# Patient Record
Sex: Female | Born: 1985 | Race: White | Hispanic: No | Marital: Married | Smoking: Former smoker
Health system: Southern US, Community
[De-identification: ages and names within clinical notes are randomized; demographics above are authoritative.]

## PROBLEM LIST (undated history)

## (undated) DIAGNOSIS — Z720 Tobacco use: Secondary | ICD-10-CM

## (undated) DIAGNOSIS — I10 Essential (primary) hypertension: Secondary | ICD-10-CM

## (undated) DIAGNOSIS — K219 Gastro-esophageal reflux disease without esophagitis: Secondary | ICD-10-CM

## (undated) DIAGNOSIS — E039 Hypothyroidism, unspecified: Secondary | ICD-10-CM

## (undated) DIAGNOSIS — K449 Diaphragmatic hernia without obstruction or gangrene: Secondary | ICD-10-CM

## (undated) DIAGNOSIS — I493 Ventricular premature depolarization: Secondary | ICD-10-CM

## (undated) HISTORY — DX: Gastro-esophageal reflux disease without esophagitis: K21.9

## (undated) HISTORY — DX: Diaphragmatic hernia without obstruction or gangrene: K44.9

## (undated) HISTORY — DX: Tobacco use: Z72.0

## (undated) HISTORY — DX: Ventricular premature depolarization: I49.3

---
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
## 2006-01-05 ENCOUNTER — Inpatient Hospital Stay (HOSPITAL_COMMUNITY): Admission: AD | Admit: 2006-01-05 | Discharge: 2006-01-08 | Payer: Self-pay | Admitting: Obstetrics and Gynecology

## 2006-01-10 ENCOUNTER — Inpatient Hospital Stay (HOSPITAL_COMMUNITY): Admission: AD | Admit: 2006-01-10 | Discharge: 2006-01-12 | Payer: Self-pay | Admitting: Obstetrics and Gynecology

## 2006-01-12 ENCOUNTER — Inpatient Hospital Stay (HOSPITAL_COMMUNITY): Admission: AD | Admit: 2006-01-12 | Discharge: 2006-01-14 | Payer: Self-pay | Admitting: Obstetrics & Gynecology

## 2006-01-17 ENCOUNTER — Inpatient Hospital Stay (HOSPITAL_COMMUNITY): Admission: AD | Admit: 2006-01-17 | Discharge: 2006-01-21 | Payer: Self-pay | Admitting: Obstetrics and Gynecology

## 2006-01-17 ENCOUNTER — Ambulatory Visit (HOSPITAL_COMMUNITY): Admission: RE | Admit: 2006-01-17 | Discharge: 2006-01-17 | Payer: Self-pay | Admitting: Obstetrics & Gynecology

## 2006-01-23 ENCOUNTER — Observation Stay (HOSPITAL_COMMUNITY): Admission: AD | Admit: 2006-01-23 | Discharge: 2006-01-23 | Payer: Self-pay | Admitting: Obstetrics and Gynecology

## 2006-01-27 ENCOUNTER — Observation Stay (HOSPITAL_COMMUNITY): Admission: AD | Admit: 2006-01-27 | Discharge: 2006-01-27 | Payer: Self-pay | Admitting: Obstetrics and Gynecology

## 2006-02-01 ENCOUNTER — Ambulatory Visit (HOSPITAL_COMMUNITY): Admission: EM | Admit: 2006-02-01 | Discharge: 2006-02-01 | Payer: Self-pay | Admitting: Obstetrics and Gynecology

## 2006-02-12 ENCOUNTER — Ambulatory Visit (HOSPITAL_COMMUNITY): Admission: AD | Admit: 2006-02-12 | Discharge: 2006-02-12 | Payer: Self-pay | Admitting: Obstetrics and Gynecology

## 2006-02-14 ENCOUNTER — Inpatient Hospital Stay (HOSPITAL_COMMUNITY): Admission: AD | Admit: 2006-02-14 | Discharge: 2006-02-18 | Payer: Self-pay | Admitting: Obstetrics & Gynecology

## 2006-02-15 ENCOUNTER — Encounter (INDEPENDENT_AMBULATORY_CARE_PROVIDER_SITE_OTHER): Payer: Self-pay | Admitting: *Deleted

## 2006-02-28 ENCOUNTER — Emergency Department (HOSPITAL_COMMUNITY): Admission: EM | Admit: 2006-02-28 | Discharge: 2006-02-28 | Payer: Self-pay | Admitting: Emergency Medicine

## 2006-03-04 ENCOUNTER — Ambulatory Visit (HOSPITAL_COMMUNITY): Admission: RE | Admit: 2006-03-04 | Discharge: 2006-03-04 | Payer: Self-pay | Admitting: General Surgery

## 2006-03-04 ENCOUNTER — Encounter (INDEPENDENT_AMBULATORY_CARE_PROVIDER_SITE_OTHER): Payer: Self-pay | Admitting: Specialist

## 2006-04-06 ENCOUNTER — Emergency Department (HOSPITAL_COMMUNITY): Admission: EM | Admit: 2006-04-06 | Discharge: 2006-04-06 | Payer: Self-pay | Admitting: Emergency Medicine

## 2006-04-21 ENCOUNTER — Emergency Department (HOSPITAL_COMMUNITY): Admission: EM | Admit: 2006-04-21 | Discharge: 2006-04-21 | Payer: Self-pay | Admitting: Emergency Medicine

## 2006-05-28 HISTORY — PX: ESOPHAGOGASTRODUODENOSCOPY: SHX1529

## 2006-07-16 ENCOUNTER — Emergency Department (HOSPITAL_COMMUNITY): Admission: EM | Admit: 2006-07-16 | Discharge: 2006-07-16 | Payer: Self-pay | Admitting: Emergency Medicine

## 2006-09-12 ENCOUNTER — Emergency Department (HOSPITAL_COMMUNITY): Admission: EM | Admit: 2006-09-12 | Discharge: 2006-09-13 | Payer: Self-pay | Admitting: Emergency Medicine

## 2007-02-10 ENCOUNTER — Emergency Department (HOSPITAL_COMMUNITY): Admission: EM | Admit: 2007-02-10 | Discharge: 2007-02-11 | Payer: Self-pay | Admitting: Emergency Medicine

## 2007-03-02 ENCOUNTER — Emergency Department (HOSPITAL_COMMUNITY): Admission: EM | Admit: 2007-03-02 | Discharge: 2007-03-02 | Payer: Self-pay | Admitting: Emergency Medicine

## 2007-03-28 ENCOUNTER — Ambulatory Visit: Payer: Self-pay | Admitting: Internal Medicine

## 2007-04-03 ENCOUNTER — Ambulatory Visit (HOSPITAL_COMMUNITY): Admission: RE | Admit: 2007-04-03 | Discharge: 2007-04-03 | Payer: Self-pay | Admitting: Internal Medicine

## 2007-04-03 ENCOUNTER — Ambulatory Visit: Payer: Self-pay | Admitting: Internal Medicine

## 2007-04-23 ENCOUNTER — Ambulatory Visit: Payer: Self-pay | Admitting: Gastroenterology

## 2007-05-07 ENCOUNTER — Ambulatory Visit (HOSPITAL_COMMUNITY): Admission: RE | Admit: 2007-05-07 | Discharge: 2007-05-07 | Payer: Self-pay | Admitting: Gastroenterology

## 2007-10-23 ENCOUNTER — Emergency Department (HOSPITAL_COMMUNITY): Admission: EM | Admit: 2007-10-23 | Discharge: 2007-10-24 | Payer: Self-pay | Admitting: Emergency Medicine

## 2008-01-16 ENCOUNTER — Emergency Department (HOSPITAL_COMMUNITY): Admission: EM | Admit: 2008-01-16 | Discharge: 2008-01-16 | Payer: Self-pay | Admitting: Emergency Medicine

## 2008-02-22 ENCOUNTER — Emergency Department (HOSPITAL_COMMUNITY): Admission: EM | Admit: 2008-02-22 | Discharge: 2008-02-22 | Payer: Self-pay | Admitting: Emergency Medicine

## 2009-02-08 ENCOUNTER — Ambulatory Visit: Payer: Self-pay | Admitting: Internal Medicine

## 2009-02-08 DIAGNOSIS — K219 Gastro-esophageal reflux disease without esophagitis: Secondary | ICD-10-CM | POA: Insufficient documentation

## 2009-02-08 DIAGNOSIS — R1013 Epigastric pain: Secondary | ICD-10-CM

## 2009-02-09 ENCOUNTER — Ambulatory Visit (HOSPITAL_COMMUNITY): Admission: RE | Admit: 2009-02-09 | Discharge: 2009-02-09 | Payer: Self-pay | Admitting: Internal Medicine

## 2009-02-09 ENCOUNTER — Encounter (INDEPENDENT_AMBULATORY_CARE_PROVIDER_SITE_OTHER): Payer: Self-pay

## 2009-02-10 ENCOUNTER — Encounter: Payer: Self-pay | Admitting: Gastroenterology

## 2009-02-10 ENCOUNTER — Telehealth (INDEPENDENT_AMBULATORY_CARE_PROVIDER_SITE_OTHER): Payer: Self-pay | Admitting: *Deleted

## 2009-02-21 ENCOUNTER — Other Ambulatory Visit: Admission: RE | Admit: 2009-02-21 | Discharge: 2009-02-21 | Payer: Self-pay | Admitting: Obstetrics and Gynecology

## 2009-02-24 ENCOUNTER — Emergency Department (HOSPITAL_COMMUNITY): Admission: EM | Admit: 2009-02-24 | Discharge: 2009-02-24 | Payer: Self-pay | Admitting: Emergency Medicine

## 2009-03-05 ENCOUNTER — Ambulatory Visit: Payer: Self-pay | Admitting: Physician Assistant

## 2009-03-05 ENCOUNTER — Inpatient Hospital Stay (HOSPITAL_COMMUNITY): Admission: AD | Admit: 2009-03-05 | Discharge: 2009-03-05 | Payer: Self-pay | Admitting: Obstetrics and Gynecology

## 2009-03-23 ENCOUNTER — Inpatient Hospital Stay (HOSPITAL_COMMUNITY): Admission: AD | Admit: 2009-03-23 | Discharge: 2009-03-24 | Payer: Self-pay | Admitting: Obstetrics & Gynecology

## 2009-04-18 ENCOUNTER — Inpatient Hospital Stay (HOSPITAL_COMMUNITY): Admission: AD | Admit: 2009-04-18 | Discharge: 2009-04-18 | Payer: Self-pay | Admitting: Family Medicine

## 2009-05-23 ENCOUNTER — Inpatient Hospital Stay (HOSPITAL_COMMUNITY): Admission: AD | Admit: 2009-05-23 | Discharge: 2009-05-24 | Payer: Self-pay | Admitting: Obstetrics & Gynecology

## 2009-05-28 HISTORY — PX: TUBAL LIGATION: SHX77

## 2009-06-13 ENCOUNTER — Inpatient Hospital Stay (HOSPITAL_COMMUNITY): Admission: AD | Admit: 2009-06-13 | Discharge: 2009-06-13 | Payer: Self-pay | Admitting: Family Medicine

## 2009-08-16 ENCOUNTER — Ambulatory Visit: Payer: Self-pay | Admitting: Advanced Practice Midwife

## 2009-08-16 ENCOUNTER — Inpatient Hospital Stay (HOSPITAL_COMMUNITY): Admission: AD | Admit: 2009-08-16 | Discharge: 2009-08-16 | Payer: Self-pay | Admitting: Obstetrics & Gynecology

## 2009-10-20 ENCOUNTER — Inpatient Hospital Stay (HOSPITAL_COMMUNITY): Admission: RE | Admit: 2009-10-20 | Discharge: 2009-10-22 | Payer: Self-pay | Admitting: Obstetrics and Gynecology

## 2009-10-20 ENCOUNTER — Ambulatory Visit: Payer: Self-pay | Admitting: Family Medicine

## 2009-10-23 ENCOUNTER — Emergency Department (HOSPITAL_COMMUNITY): Admission: EM | Admit: 2009-10-23 | Discharge: 2009-10-23 | Payer: Self-pay | Admitting: Emergency Medicine

## 2010-06-18 ENCOUNTER — Encounter: Payer: Self-pay | Admitting: Internal Medicine

## 2010-08-01 ENCOUNTER — Encounter: Payer: Self-pay | Admitting: Gastroenterology

## 2010-08-09 ENCOUNTER — Ambulatory Visit: Payer: Self-pay | Admitting: Gastroenterology

## 2010-08-13 LAB — WET PREP, GENITAL
Clue Cells Wet Prep HPF POC: NONE SEEN
Trich, Wet Prep: NONE SEEN
Yeast Wet Prep HPF POC: NONE SEEN

## 2010-08-14 LAB — POCT CARDIAC MARKERS
CKMB, poc: 1 ng/mL (ref 1.0–8.0)
Myoglobin, poc: 53 ng/mL (ref 12–200)
Troponin i, poc: <0.05 ng/mL (ref 0.00–0.09)

## 2010-08-14 LAB — CBC
HCT: 27.7 % — ABNORMAL LOW (ref 36.0–46.0)
HCT: 32.2 % — ABNORMAL LOW (ref 36.0–46.0)
Hemoglobin: 11.3 g/dL — ABNORMAL LOW (ref 12.0–15.0)
MCHC: 35.2 g/dL (ref 30.0–36.0)
MCV: 92.4 fL (ref 78.0–100.0)
MCV: 92.6 fL (ref 78.0–100.0)
Platelets: 153 10*3/uL (ref 150–400)
Platelets: 167 10*3/uL (ref 150–400)
RBC: 3.48 MIL/uL — ABNORMAL LOW (ref 3.87–5.11)
RDW: 13.3 % (ref 11.5–15.5)
RDW: 13.5 % (ref 11.5–15.5)
WBC: 8.6 10*3/uL (ref 4.0–10.5)

## 2010-08-14 LAB — RPR: RPR Ser Ql: NONREACTIVE

## 2010-08-21 LAB — URINALYSIS, ROUTINE W REFLEX MICROSCOPIC
Bilirubin Urine: NEGATIVE
Glucose, UA: NEGATIVE mg/dL
Hgb urine dipstick: NEGATIVE
Ketones, ur: NEGATIVE mg/dL
Leukocytes, UA: NEGATIVE
Nitrite: NEGATIVE
Protein, ur: 30 mg/dL — AB
Specific Gravity, Urine: 1.015 (ref 1.005–1.030)
Urobilinogen, UA: 0.2 mg/dL (ref 0.0–1.0)
pH: 7 (ref 5.0–8.0)

## 2010-08-21 LAB — URINE MICROSCOPIC-ADD ON

## 2010-08-21 LAB — FETAL FIBRONECTIN: Fetal Fibronectin: NEGATIVE

## 2010-08-28 LAB — URINALYSIS, ROUTINE W REFLEX MICROSCOPIC
Ketones, ur: NEGATIVE mg/dL
Nitrite: NEGATIVE
Specific Gravity, Urine: 1.02 (ref 1.005–1.030)
Urobilinogen, UA: 0.2 mg/dL (ref 0.0–1.0)
pH: 7 (ref 5.0–8.0)

## 2010-08-28 LAB — WET PREP, GENITAL: Clue Cells Wet Prep HPF POC: NONE SEEN

## 2010-08-30 LAB — WET PREP, GENITAL
Clue Cells Wet Prep HPF POC: NONE SEEN
Trich, Wet Prep: NONE SEEN
Yeast Wet Prep HPF POC: NONE SEEN

## 2010-08-30 LAB — CBC
HCT: 36.6 % (ref 36.0–46.0)
Hemoglobin: 12.4 g/dL (ref 12.0–15.0)
MCHC: 34 g/dL (ref 30.0–36.0)
MCV: 89.2 fL (ref 78.0–100.0)
RDW: 13.9 % (ref 11.5–15.5)

## 2010-08-30 LAB — URINALYSIS, ROUTINE W REFLEX MICROSCOPIC
Bilirubin Urine: NEGATIVE
Glucose, UA: NEGATIVE mg/dL
Hgb urine dipstick: NEGATIVE
Ketones, ur: NEGATIVE mg/dL
Protein, ur: NEGATIVE mg/dL

## 2010-08-31 LAB — URINALYSIS, ROUTINE W REFLEX MICROSCOPIC
Bilirubin Urine: NEGATIVE
Glucose, UA: NEGATIVE mg/dL
Glucose, UA: NEGATIVE mg/dL
Hgb urine dipstick: NEGATIVE
Ketones, ur: NEGATIVE mg/dL
Ketones, ur: 15 mg/dL — AB
Nitrite: NEGATIVE
Protein, ur: NEGATIVE mg/dL
Specific Gravity, Urine: 1.02 (ref 1.005–1.030)
pH: 6.5 (ref 5.0–8.0)
pH: 6 (ref 5.0–8.0)

## 2010-08-31 LAB — WET PREP, GENITAL
Trich, Wet Prep: NONE SEEN
Yeast Wet Prep HPF POC: NONE SEEN

## 2010-09-01 LAB — CBC
HCT: 38.1 % (ref 36.0–46.0)
Hemoglobin: 13.3 g/dL (ref 12.0–15.0)
MCV: 87.2 fL (ref 78.0–100.0)
Platelets: 260 10*3/uL (ref 150–400)
WBC: 10 10*3/uL (ref 4.0–10.5)

## 2010-09-01 LAB — DIFFERENTIAL
Eosinophils Absolute: 0.2 10*3/uL (ref 0.0–0.7)
Eosinophils Relative: 2 % (ref 0–5)
Lymphocytes Relative: 30 % (ref 12–46)
Lymphs Abs: 3 10*3/uL (ref 0.7–4.0)
Monocytes Absolute: 0.6 10*3/uL (ref 0.1–1.0)
Monocytes Relative: 6 % (ref 3–12)

## 2010-09-01 LAB — URINALYSIS, ROUTINE W REFLEX MICROSCOPIC
Bilirubin Urine: NEGATIVE
Glucose, UA: NEGATIVE mg/dL
Hgb urine dipstick: NEGATIVE
Nitrite: NEGATIVE
Specific Gravity, Urine: 1.015 (ref 1.005–1.030)
pH: 6.5 (ref 5.0–8.0)

## 2010-09-01 LAB — URINE CULTURE: Colony Count: >=100000

## 2010-09-01 LAB — BASIC METABOLIC PANEL
BUN: 10 mg/dL (ref 6–23)
CO2: 25 mEq/L (ref 19–32)
Calcium: 9.2 mg/dL (ref 8.4–10.5)
Chloride: 106 mEq/L (ref 96–112)
Creatinine, Ser: 0.61 mg/dL (ref 0.4–1.2)
GFR calc Af Amer: >60 mL/min (ref >60)
GFR calc non Af Amer: >60 mL/min (ref >60)
GFR calc non Af Amer: >60 mL/min (ref >60)
Glucose, Bld: 94 mg/dL (ref 70–99)
Potassium: 3.8 mEq/L (ref 3.5–5.1)
Sodium: 137 mEq/L (ref 135–145)
Sodium: 135 mEq/L (ref 135–145)

## 2010-09-01 LAB — HEMOGLOBIN AND HEMATOCRIT, BLOOD: Hemoglobin: 13.6 g/dL (ref 12.0–15.0)

## 2010-09-27 ENCOUNTER — Emergency Department (HOSPITAL_COMMUNITY): Payer: Medicaid Other

## 2010-09-27 ENCOUNTER — Emergency Department (HOSPITAL_COMMUNITY)
Admission: EM | Admit: 2010-09-27 | Discharge: 2010-09-27 | Disposition: A | Discharge status: Home / Self Care | Payer: Medicaid Other | Attending: Emergency Medicine | Admitting: Emergency Medicine

## 2010-09-27 DIAGNOSIS — R569 Unspecified convulsions: Secondary | ICD-10-CM | POA: Insufficient documentation

## 2010-09-27 DIAGNOSIS — J45909 Unspecified asthma, uncomplicated: Secondary | ICD-10-CM | POA: Insufficient documentation

## 2010-09-27 DIAGNOSIS — S0003XA Contusion of scalp, initial encounter: Secondary | ICD-10-CM | POA: Insufficient documentation

## 2010-09-27 DIAGNOSIS — S1093XA Contusion of unspecified part of neck, initial encounter: Secondary | ICD-10-CM | POA: Insufficient documentation

## 2010-09-27 DIAGNOSIS — X58XXXA Exposure to other specified factors, initial encounter: Secondary | ICD-10-CM | POA: Insufficient documentation

## 2010-09-27 LAB — DIFFERENTIAL
Basophils Relative: 0 % (ref 0–1)
Eosinophils Absolute: 0.1 10*3/uL (ref 0.0–0.7)
Lymphs Abs: 2 10*3/uL (ref 0.7–4.0)
Monocytes Relative: 5 % (ref 3–12)
Neutro Abs: 7.2 10*3/uL (ref 1.7–7.7)
Neutrophils Relative %: 73 % (ref 43–77)

## 2010-09-27 LAB — URINALYSIS, ROUTINE W REFLEX MICROSCOPIC
Glucose, UA: NEGATIVE mg/dL
Hgb urine dipstick: NEGATIVE
Specific Gravity, Urine: >1.03 — ABNORMAL HIGH (ref 1.005–1.030)
Urobilinogen, UA: 0.2 mg/dL (ref 0.0–1.0)

## 2010-09-27 LAB — POCT PREGNANCY, URINE: Preg Test, Ur: NEGATIVE

## 2010-09-27 LAB — CBC
Hemoglobin: 13.5 g/dL (ref 12.0–15.0)
MCH: 28.1 pg (ref 26.0–34.0)
Platelets: 281 10*3/uL (ref 150–400)
RBC: 4.8 MIL/uL (ref 3.87–5.11)
WBC: 9.9 10*3/uL (ref 4.0–10.5)

## 2010-09-27 LAB — BASIC METABOLIC PANEL
CO2: 25 mEq/L (ref 19–32)
Chloride: 102 mEq/L (ref 96–112)
GFR calc Af Amer: >60 mL/min (ref >60)
Sodium: 138 mEq/L (ref 135–145)

## 2010-09-27 LAB — HEPATIC FUNCTION PANEL
Albumin: 4.4 g/dL (ref 3.5–5.2)
Total Protein: 8.1 g/dL (ref 6.0–8.3)

## 2010-10-03 ENCOUNTER — Other Ambulatory Visit: Payer: Self-pay | Admitting: Neurology

## 2010-10-03 DIAGNOSIS — R569 Unspecified convulsions: Secondary | ICD-10-CM

## 2010-10-10 ENCOUNTER — Ambulatory Visit (HOSPITAL_COMMUNITY)
Admission: RE | Admit: 2010-10-10 | Discharge: 2010-10-10 | Disposition: A | Discharge status: Home / Self Care | Payer: Medicaid Other | Source: Ambulatory Visit | Attending: Neurology | Admitting: Neurology

## 2010-10-10 DIAGNOSIS — G40802 Other epilepsy, not intractable, without status epilepticus: Secondary | ICD-10-CM | POA: Insufficient documentation

## 2010-10-10 DIAGNOSIS — R569 Unspecified convulsions: Secondary | ICD-10-CM

## 2010-10-10 NOTE — Op Note (Signed)
Teresa [name], Teresa [name]           ACCOUNT NO.:  [id number]   MEDICAL RECORD NO.:  [id number]          PATIENT TYPE:  AMB   LOCATION:  DAY                           FACILITY:  [facility]   PHYSICIAN:  R. Roetta Sessions, M.D. DATE OF BIRTH:  09/18/1985   DATE OF PROCEDURE:  04/03/2007  DATE OF DISCHARGE:                               OPERATIVE REPORT   PROCEDURE:  Diagnostic EGD, incomplete.   INDICATIONS FOR PROCEDURE:  The patient has longstanding esophageal  reflux disease, postprandial nausea and vomiting, occasional sensation  that food sticks behind her breast bone. She has had EGD previously and  underwent esophageal dilation in the past. She tells me she has  heartburn all the time and takes only Tums. EGD is now being done.  The potential for esophageal dilation reviewed with the patient  previously and again at bedside. All questions were answered.   MONITORING:  O2 saturation, blood pressure, pulse and respirations were  monitored throughout the entire procedure.   CONSCIOUS SEDATION:  Versed 7 mg IV, Demerol 150 mg IV, Phenergan 25 mg  diluted slow IV push IV to augment conscious sedation.  Cetacaine spray  for topical pharyngeal anesthesia.   INSTRUMENT:  Pentax video chip system.   FINDINGS:  Examination of the tubular esophagus revealed circumferential  distal esophageal erosions. There was no Barrett's esophagus and the  esophagus appeared to be widely patent.   STOMACH:  The gastric cavity insufflated well with air.  There were no  gross gastric mucosal abnormalities.  Retroflexion of the proximal  stomach and esophagogastric junction demonstrated a small hiatal hernia.  It is notable the patient became combative during the procedure in spite  of what I felt was adequate sedation and Phenergan augmentation.  She  was combative and uncooperative for the procedure.  The pylorus appeared  be patent but was not intubated, the duodenum was not seen.  The entire  gastric  mucosa was not well seen because the stomach could not be  insufflated well.  No attempt at dilation was made. The procedure was  terminated without being carried out to completion for the reasons  outlined above.   Incomplete exam. Circumferential distal esophageal erosions consistent  with moderately severe erosive reflux esophagitis. If anything patulous  esophagogastric junction, no significant ring or stricture seen.  The  patient has a hiatal hernia.  The stomach appeared grossly normal, the  pylorus was not intubated, duodenum was not seen. At this point, I went  we have enough information to treat her aggressively for  gastroesophageal reflux disease.  She tells me she had some bleeding and  left lower quadrant abdominal pain.  She has left lower quadrant  abdominal pain primarily when she has the epigastric of retrosternal  chest pain and when those symptoms settle down she does not have any  left lower quadrant pain. She has not had any change in her bowel  habits, there has been no melena or rectal bleeding and today her  abdominal exam reveals a flat abdomen, good bowel sounds and is entirely  soft and nontender. She is slated to have a  CT scan tomorrow. At this  point, I am going to cancel the CT for the time being. I started her on  Zegerid 40 mg orally twice daily for the next 2 weeks before breakfast  and supper and then back off to once daily before supper thereafter. For  the subsequent 2 weeks will have her come back to the office to see [place]  in 4 weeks for a recheck,  antireflux literature provided to Ms. Ford-  Ward. If this is not associated with marked dramatic improvement in her  symptoms would contemplate bringing her back for a complete evaluation  for her upper GI tract under propofol sedation with anesthesia help and  then would consider rescheduling abdominopelvic CT scan, but this lady  does have reflux and hopefully with aggressive acid suppression regimen   we will see dramatic improvement in the near future.      Jonathon Bellows, M.D.  Electronically Signed     RMR/MEDQ  D:  04/03/2007  T:  04/04/2007  Job:  [id number]   cc:   Mila Homer. Sudie Bailey, M.D.  Fax: [phone]

## 2010-10-10 NOTE — Consult Note (Signed)
NAMESHAVONTE, Teresa [name]           ACCOUNT NO.:  [id number]   MEDICAL RECORD NO.:  [id number]          PATIENT TYPE:  AMB   LOCATION:  DAY                           FACILITY:  [facility]   PHYSICIAN:  R. Roetta Sessions, M.D. DATE OF BIRTH:  19-Nov-1985   DATE OF CONSULTATION:  03/28/2007  DATE OF DISCHARGE:                                 CONSULTATION   REASON FOR CONSULTATION:  Nausea, vomiting, epigastric pain.   REQUESTING PHYSICIAN:  Mila Homer. Sudie Bailey, M.D.   HISTORY OF PRESENT ILLNESS:  Patient is a 25 year old Caucasian female  who presents today for further evaluation of post prandial nausea,  vomiting, epigastric pain.  She also has been having fainting spells  associated with vertigo and shortness of breath.  She states that she  has chronic asthma and uses albuterol a couple of times a week.  She has  seen Dr. Sudie Bailey for these symptoms.  She states that for the past 1-  1/2 months, she has been having post-prandial nausea and vomiting.  She  states that this feels like her food does not get all the way down.  She  has epigastric discomfort when this occurs.  She tries to wash the food  down with fluids, but it does not seem to help and eventually she  vomits.  She also has a left lower quadrant abdominal pain, which often  occurs at the same time.  Her bowel movements are unchanged and are very  regular.  No melena or rectal bleeding.  She has constant heartburn.  She states that her symptoms are worse when she lies down.  She denies  any NSAIDs or aspirin use.  She takes Tums p.r.n. without relief.  She  states that about six years ago, she was evaluated at [facility] and underwent  EGD and either a flexible sigmoidoscopy or a colonoscopy.  She states  that she had esophagitis and was told that she would need to come back  in about six years to have her esophagus stretched; however, she did not  have her esophagus stretched at that time.  She denies any unintentional  weight loss.   She states that she has actually gained 15 pounds in the  last two weeks and about 30 pounds over the last 1-2 months.  She states  that she is not eating enough to explain this.   She had a mildly elevated white count of 11,800, hemoglobin 12.9,  platelet count 379,000.  Sed rate was 13.  Her glucose was 100,  creatinine 0.65, total bilirubin 0.2, alkaline phosphatase 96, AST 11,  ALT 8, albumin 4.3.  Lipase 13, H. pylori 0.4.   HOME MEDICATIONS:  1. Albuterol inhaler usually 2 times a week.  2. Tums p.r.n.   ALLERGIES:  CECLOR.   PAST MEDICAL HISTORY:  1. Asthma.  2. GERD.  3. Borderline diabetes mellitus.  4. 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  5. Status post cesarean section in September, 2007.   FAMILY HISTORY:  She believes her mother has peptic ulcer disease.  No  family history  of diabetes, colorectal cancer, or liver disease.   SOCIAL HISTORY:  She is engaged.  She has a 90-year-old daughter and an  adopted 14-year-old son.  She is currently unemployed.  She does not  smoke and smoked for about six months as a teenager.  No alcohol use.   REVIEW OF SYSTEMS:  See HPI for GI.  CARDIOPULMONARY:  No chest pain,  shortness of breath.  GENITOURINARY:  No dysuria or hematuria.  Menses  are regular.  CONSTITUTIONAL:  See HPI for weight gain.   PHYSICAL EXAMINATION:  Weight 208.  Height 5 feet 5.  Temperature 98,  blood pressure 118/88, pulse 76.  GENERAL:  A pleasant, obese Caucasian female in no acute distress.  SKIN:  Warm and dry.  No jaundice.  HEENT:  Sclerae are anicteric.  Oropharyngeal mucosa moist and pink.  No  lesions, erythema, or exudate.  No lymphadenopathy or thyromegaly.  CHEST:  Lung sounds are clear to auscultation.  CARDIAC:  Regular rate and rhythm.  Normal S1 and S2.  No murmurs, rubs  or gallops.  ABDOMEN:  Positive bowel sounds.  Abdomen is obese but symmetrical and  soft.  She has moderate epigastric  tenderness to deep palpation as well  as in the left lower quadrant region.  No rebound tenderness or  guarding.  No organomegaly or masses.  No abdominal bruits or hernias.  EXTREMITIES:  No edema.   IMPRESSION:  Teresa [name] is a 25 year old lady with a 6-week history of post-  prandial nausea and vomiting, epigastric discomfort, and possibly  dysphagia.  She also has left lower quadrant abdominal pain which occurs  with all of these symptoms but without any change in her bowel  movements.  She may have developed an esophageal stricture or  complications from such as erosive, ulcerative reflux esophagitis.  Cannot exclude peptic ulcer disease.  With regards to her left lower  quadrant abdominal pain, would feel diverticulitis would be less likely.  It may be irritable bowel syndrome.  She complains of a 30 pound weight  gain in the last six weeks.  She is also having vertigo and some  fainting spells which she states lasts for seconds at a time.  She is  being followed by Dr. Sudie Bailey for this.   PLAN:  1. Will check a TSH.  2. EGD with esophageal dilatation.  3. CT of the abdomen and pelvis with IV and normal contrast to further      evaluate left lower quadrant abdominal pain.   I would like to thank Dr. John Giovanni for allowing [place] to take part  in the care of this patient.      Tana Coast, P.AJonathon Bellows, M.D.  Electronically Signed    LL/MEDQ  D:  03/28/2007  T:  03/29/2007  Job:  [id number]   cc:   Mila Homer. Sudie Bailey, M.D.  Fax: [phone]

## 2010-10-10 NOTE — Assessment & Plan Note (Signed)
Teresa [name], Teresa [name]            CHART#:  [id number]   DATE:  04/23/2007                       DOB:  Oct 03, 1985   CHIEF COMPLAINT:  Follow up of acid reflux and abdominal pain.   SUBJECTIVE:  Teresa [name] is here for a follow up.  She recently underwent  incomplete EGD by Dr. Jena Gauss on 04/03/07.  She was complaining of long  standing reflux which was uncontrolled, post-prandial nausea and  vomiting, dysphagia, left lower quadrant abdominal pain.  Her bowel  movements have always been regular and have not changed.  She notes that  her left lower quadrant abdominal pain would occur whenever her upper  abdominal discomfort or reflux occurred.  EGD was incomplete as the  patient had become combative during the procedure.  She states today  that she did not feel like she had adequate sedation and was in pain.  Her esophagus was noted to have circumferential distal esophageal  erosions.  She had a patulous EG junction.  She had a hiatal hernia.  The stomach appeared grossly normal.  Pylorus could not be intubated due  to the patient becoming combative.  She was started on Zegerid 40 mg  b.i.d.  She states that it seemed to help her reflux more than once day  dosing, however she continued to have symptoms.  She notes that her  abdominal pain has become progressively worse and is occurring now from  the left lower quadrant up the left mid abdomen.  She has had no change  in her bowel movements.  She did note about a week after her procedure  she passed black stools for about one week.  She denies any associated  lightheadedness or fatigue, shortness of breath or presyncope.  The  black stools have cleared.  When this occurred she had also seen a  little fresh blood on the toilet tissue.  She denies any further  vomiting.   CURRENT MEDICATIONS:  See detailed list.   ALLERGIES:  Ceclor.   PHYSICAL EXAMINATION:  WEIGHT:  206.5.  VITAL SIGNS:  Stable.  Temperature 97.6, blood pressure 118/80,  pulse  80.  GENERAL:  A pleasant, obese Caucasian female in no acute distress.  SKIN:  Warm and dry.  No jaundice.  HEENT:  Sclerae nonicteric.  Oral pharyngeal mucosa moist and pink.  CHEST:  Lungs clear to auscultation.  CARDIAC EXAM:  Reveals regular rate and rhythm.  ABDOMEN:  Positive bowel sounds.  Obese but symmetrical.  Soft.  She has  moderate tenderness from the left lower quadrant to the left mid abdomen  to deep palpation as well as some in the epigastrium.  No rebound  tenderness.  No guarding.  No organomegaly or masses, bruits or hernias.  EXTREMITIES:  No edema.   IMPRESSION:  Teresa [name] continues to have refractory GERD symptoms although  they did improve temporarily on Zegerid b.i.d. but worse now on once  a  day dosing.  She previously was on Prilosec in the past without adequate  control of her symptoms.  Her nausea and vomiting has subsided.  She has  progressive abdominal pain in the left lower quadrant to left mid  abdomen.  She reports a history of passing of black stools for about one  week which was a couple of weeks ago.  She also has occasionally seen  bright red  blood on the toilet tissue when this occurred.  Denies any  agents such as Pepto Bismol.  Really no orthostatic type symptoms  suggestive of a significant GI bleed.  On exam no clinical evidence of  significant anemia.   Because of persistent abdominal pain would like to go ahead and pursue  CT at this point in time.  If she sees anymore black stools she will  notify our office.  In the meantime, we will go ahead and check  hemoccult times three.  Would change her from Zegerid to Aciphex 20 mg  b.i.d. #30 samples given.  Will check a CBC, Met-7 as well.  Further  recommendations to follow.       Tana Coast, P.A.  Electronically Signed     Kassie Mends, M.D.  Electronically Signed    LL/MEDQ  D:  04/23/2007  T:  04/23/2007  Job:  [id number]   cc:   Mila Homer. Sudie Bailey, M.D.

## 2010-10-11 NOTE — Procedures (Signed)
NAMENICHOL, ATOR                ACCOUNT NO.:  [id number]  MEDICAL RECORD NO.:  [id number]           PATIENT TYPE:  O  LOCATION:  EE                           FACILITY:  [facility]  PHYSICIAN:  Lawerence Dery A. Gerilyn Pilgrim, M.D. DATE OF BIRTH:  11-22-1985  DATE OF PROCEDURE: DATE OF DISCHARGE:  10/10/2010                             EEG INTERPRETATION   INDICATIONS:  This is a 25 year old female who presents with new onset seizures.  MEDICATIONS:  Lorazepam.  ANALYSIS:  A 16-channel recording is conducted for 21 minutes using standard 10/20 measurements.  There is a posterior dominant rhythm of 10 Hz which attenuates with eye opening.  There is beta activity observed in the frontal areas.  Awake and sleep activities are recorded.  Vertex sharp waves along with K complexes and sleep spindles are observed. Photic stimulation and hyperventilation are carried out without abnormal changes in the background activity.  There is no focal or lateralized slowing.  There is no epileptiform activity observed.  IMPRESSION:  Normal recording of awake and sleep states.     Theodosia Bahena A. Gerilyn Pilgrim, M.D.     KAD/MEDQ  D:  10/11/2010  T:  10/11/2010  Job:  [id number]

## 2010-10-13 NOTE — Op Note (Signed)
Teresa [name], Teresa [name]           ACCOUNT NO.:  [id number]   MEDICAL RECORD NO.:  [id number]          PATIENT TYPE:  INP   LOCATION:  A402                          FACILITY:  [facility]   PHYSICIAN:  Tilda Burrow, M.D. DATE OF BIRTH:  03/14/1986   DATE OF PROCEDURE:  02/15/2006  DATE OF DISCHARGE:  02/18/2006                                 OPERATIVE REPORT   PREOPERATIVE DIAGNOSES:  Pregnancy, [redacted] weeks gestation, medical induction of  labor secondary to symptomatic gallstones, failed induction secondary to  breech presentation.   POSTOPERATIVE DIAGNOSES:  Pregnancy, [redacted] weeks gestation.  Medical induction  of labor secondary to symptomatic gallstones.  Failed induction secondary to  breech presentation.   PROCEDURE:  Primary low transverse cervical cesarean section.   SURGEON:  Tilda Burrow, MD.   ASSISTANT:  Canary Brim, RN.   ANESTHESIA:  Epidural converted to general due to hot spot in left lower  quadrant.   COMPLICATIONS:  None.   ESTIMATED BLOOD LOSS:  350 ml.   FLUIDS:  __________   FINDINGS:  Sacrum anterior female, Apgar's 9 and 9, weight  __________ .   INDICATIONS:  A 24 year old female induced at early term gestation due to  terribly symptomatic gallstones since 28 to [redacted] weeks gestation.  The patient  was admitted, a Foley bulb cervical ripening attempted, and when she would  reach 4 cm we were able to identify that there was a sacrum anterior breech  infant instead of vertex.   DETAILS OF THE PROCEDURE:  The patient was taken to the operating room after  her labor had been stopped.  Epidural was attempted and topped up but was  ineffective on the left side, and a hot spot prevented [place] doing a cesarean  delivery.  The patient's pain tolerance has always been terribly low.   A general anesthesia was introduced, endotracheal intubation performed, and  then the C-section performed in the standard fashion with a transverse 12 cm  skin incision, a  Pfannenstiel-type incision through the fascia, rectus  muscles, and peritoneal cavity.  A bladder flap was developed on the lower  uterine segment easily, a tranverse uterine incision was performed, and the  baby delivered from its breech presentation with the buttocks first.  The  legs were flexed once we reached the knees and delivered individually easily  in a controlled fashion, then the arms were swept in front of the baby in a  similar fashion and released.  The head was delivered while Marceau maneuver  was performed, maintaining the index finger in the mouth.  The baby cried  promptly.  Bulb suctioning was performed, and the infant passed to the  waiting Dr. Milford Cage for subsequent care.  See his notes for details.   Cord blood samples were obtained.  The placenta delivered intact in [place]  presentation.  The uterus was irrigated with antibiotic-containing solution.  The posterior lower uterine segment of the uterus had a fibrinous appearance  as if there had an been old subchorionic bleed.  All membrane remnants were  removed.  The uterus was closed with a single layer of running  locking  closure of #2-0 chromic, and then the bladder flap reapproximated with #2-0  chromic.   The abdomen was irrigated, the anterior peritoneum closed with #2-0 chromic,  the rectus muscles reapproximated with interrupted #2-0 chromic, the fascia  closed with #0 Vicryl in a continuous running fashion, and then subcutaneous  interrupted #2-0 plain sutures used to reapproximate the fatty tissues, and  staple closure of the skin completed the procedure.      Tilda Burrow, M.D.  Electronically Signed     JVF/MEDQ  D:  02/15/2006  T:  02/18/2006  Job:  [id number]   cc:   [facility]   Francoise Schaumann. Milford Cage DO, FAAP  Fax: [phone]

## 2010-10-13 NOTE — Discharge Summary (Signed)
[name], Teresa [name]           ACCOUNT NO.:  [id number]   MEDICAL RECORD NO.:  [id number]          PATIENT TYPE:  INP   LOCATION:  A419                          FACILITY:  [facility]   PHYSICIAN:  Lazaro Arms, M.D.   DATE OF BIRTH:  1986-05-28   DATE OF ADMISSION:  12/14/2005  DATE OF DISCHARGE:  07/24/2007LH                                 DISCHARGE SUMMARY   DISCHARGE DIAGNOSES:  1.  Intrauterine pregnancy at [redacted] weeks gestation.  2.  Right pyelonephritis.   PROCEDURE:  Admission to the hospital with daily management and discharge  care and interpretation of NST.   HISTORY OF PRESENT ILLNESS:  Please refer to the history and physical on the  antepartum chart for details of admission to the hospital.   HOSPITAL COURSE:  The patient was admitted with the presumptive diagnosis of  pyelonephritis with positive nitrites, lots of white blood cells, and right  flank pain.  She was placed on Unasyn IV antibiotics.  She continued to  spike fevers and was switched over to gentamycin, and she subsequently  defervesced.  She also had a significant improvement in her right flank  pain.  She also had an asthma episode while she was in the hospital and got  breathing treatments several times a day.  Her white count came down  significantly from admission of 12.3 down to 5.6 at the time of discharge.  She was discharged to home, having been afebrile for 48 hours on oral  Bactrim twice a day, and we will see her back in the office in one week.      Lazaro Arms, M.D.  Electronically Signed     LHE/MEDQ  D:  12/18/2005  T:  12/18/2005  Job:  [id number]

## 2010-10-13 NOTE — Discharge Summary (Signed)
NAMEDIERRA, RIESGO           ACCOUNT NO.:  [id number]   MEDICAL RECORD NO.:  [id number]          PATIENT TYPE:  INP   LOCATION:  A402                          FACILITY:  [facility]   PHYSICIAN:  Tilda Burrow, M.D. DATE OF BIRTH:  Jul 06, 1985   DATE OF ADMISSION:  02/14/2006  DATE OF DISCHARGE:  09/24/2007LH                                 DISCHARGE SUMMARY   ADMITTING DIAGNOSES:  1. Pregnancy 37+ weeks gestation.  2. Medically-indicated induction due to symptomatic cholelithiasis.  3. Right renal cyst vs mass   DISCHARGE DIAGNOSES:  1. Pregnancy 37+ weeks gestation, delivered.  2. Medically-indicated induction due to symptomatic cholelithiasis.  3. Right renal cyst vs mass  4. Frank breech presentation.   PROCEDURE:  1. Medical induction of labor, discontinued.  2. Primary low transverse cervical cesarean section,  Jannifer Franklin, M.D.   DISCHARGE MEDICATIONS:  1. Vicodin 5/500 20 tablets one to take q.4h. p.r.n. pain.  2. Motrin 800 mg q.8h. p.r.n. discomfort and soreness.   FOLLOW UP:  Four days staple removal.   HOSPITAL SUMMARY:  This 25 year old female gravida 1, para 0, at 37+ weeks  gestation has had a pregnancy which has been challenging due to  cholelithiasis with multiple gallbladder calculi.  At 28 weeks she was  evaluated and found to have gallstones.  She ended up being managed  throughout the remainder of the pregnancy with high pain med needs and  discomfort but did not have cholecystitis, just simply symptomatic  cholelithiasis.  Additionally, she had a right renal mass;  the ultrasound  that identified the gallstones also identified a 2.4 x 2.2 x 3.2 cm  hypoechoic focus in the right mid kidney that was felt to possibly represent  a small renal mass or a focal area of infection/pyelonephritis.  There was  no shadowing or renal cyst on further detail description.   Patient was admitted on the 20th for induction.  A Foley bulb was placed,  the next  morning amniotomy performed, scalp electrode applied over bony  prominence which, once she dilated to 4 cm, could be identified as sacrum.  Labor was discontinued and the patient taken promptly for a primary cesarean  section and delivering a healthy frank breech female infant, Apgars 9/9.  See detailed operative note for details.  EBL was minimal at 250 mL.   Postoperatively, the patient did well; hemoglobin was 10.8, hematocrit 31  preop.; _hgb_ 9.2, _hct_ 26.7 postop.  She tolerated postpartum care without  difficulty.  Maternal blood type O positive.   Discharged February 18, 2006, for follow up in 4 days and then 4 weeks.  She is scheduled to see Dr. Malen Gauze at [facility], [place],  [place], [place] for [place] and will also be referred for  removal of gallbladder once she has recovered adequately from the C-section.      Tilda Burrow, M.D.  Electronically Signed     JVF/MEDQ  D:  02/18/2006  T:  02/19/2006  Job:  [id number]

## 2010-10-13 NOTE — Op Note (Signed)
[name], [name]           ACCOUNT NO.:  [id number]   MEDICAL RECORD NO.:  [id number]          PATIENT TYPE:  INP   LOCATION:  A402                          FACILITY:  [facility]   PHYSICIAN:  Lazaro Arms, M.D.   DATE OF BIRTH:  1985/08/03   DATE OF PROCEDURE:  02/15/2006  DATE OF DISCHARGE:                                 OPERATIVE REPORT   PROCEDURE:  Epidural placement.   Teresa [name] is gravida 1 at 37 weeks being induced because of multiple  admissions to the hospital during this pregnancy for cholelithiasis.  She is  2 cm and 25% this morning and is already requesting an epidural.  She was  placed in a sitting position, Betadine prep is used, 1% lidocaine was  injected in the L3-L4 interspace.  A 17 gauge Tuohy needle was used, the  loss of resistance technique employed.  It took a couple of skin sticks  because of positioning and she also has scoliosis.  The epidural space was  found with one pass using loss of resistance technique.  10 mL of 0.125%  bupivacaine was given without ill effects.  The epidural catheter was then  fed and an additional 10 mL was given.  It was taped down 5 cm into the  epidural space.  Continuous infusion was begun at 12 mL an hour.  The  patient tolerated the procedure well, she is getting pain relief.  Blood  pressure stable, fetal heart rate tracing stable.      Lazaro Arms, M.D.  Electronically Signed     LHE/MEDQ  D:  02/15/2006  T:  02/17/2006  Job:  [id number]

## 2010-10-13 NOTE — Discharge Summary (Signed)
NAMEJAZZMON, Teresa [name]           ACCOUNT NO.:  [id number]   MEDICAL RECORD NO.:  [id number]          PATIENT TYPE:  INP   LOCATION:  A428                          FACILITY:  [facility]   PHYSICIAN:  Tilda Burrow, M.D. DATE OF BIRTH:  [birth year]   DATE OF ADMISSION:  01/05/2006  DATE OF DISCHARGE:  08/14/2007LH                                 DISCHARGE SUMMARY   ADMITTING DIAGNOSES:  1. Recurrent biliary colic.  2. Pregnancy, 31 weeks 3 days.   DISCHARGE DIAGNOSES:  1. Pregnancy 32 weeks' gestation.  2. Recurrent biliary colic, stable.   DISCHARGE MEDICATIONS:  Dilaudid 2 mg p.o. q.4-6 h. p.r.n. pain.   FOLLOWUP:  Follow up 2 weeks, [facility].   HISTORY OF PRESENT ILLNESS:  This 25 year old female, gravida 1, para 0, 31  weeks 3 days, with known cholelithiasis and gallbladder thickening was  readmitted January 05, 2006, for right upper quadrant pain extending into the  back and base of the neck.  She has been seen through of office for prenatal  care and she once again presents with amylase and lipase normal, liver  function tests normal, afebrile, with temperature 97.8, but symptomatic  gallbladder discomfort.   HOSPITAL COURSE:  The patient was admitted with hemoglobin 10, hematocrit  29, white count 9800; normal electrolytes except for potassium of 3.4, BUN  6, creatinine 0.6.  As I stated, lipase and amylase and liver function tests  are negative.  The patient then had hospitalization sips and chips and  gradually reintroduced to low-fat diet.  Surgical consultation with Dr.  Lovell Sheehan was performed.  Collaborative decision was made to try to wait out  the pregnancy and address cholecystectomy after the delivery.  Plans are for  delivery at 38 weeks and we will follow every 2 weeks until delivery.   DISCHARGE MEDICATIONS:  s 66 Dilaudid tablets at 2 mg each.      Tilda Burrow, M.D.  Electronically Signed     JVF/MEDQ  D:  01/08/2006  T:  01/08/2006  Job:   [id number]

## 2010-10-13 NOTE — Discharge Summary (Signed)
NAMETAKERRA, LUPINACCI           ACCOUNT NO.:  [id number]   MEDICAL RECORD NO.:  [id number]          PATIENT TYPE:  INP   LOCATION:  LDR3                          FACILITY:  [facility]   PHYSICIAN:  Lazaro Arms, M.D.   DATE OF BIRTH:  1985-09-08   DATE OF ADMISSION:  01/17/2006  DATE OF DISCHARGE:  08/27/2007LH                                 DISCHARGE SUMMARY   DISCHARGE DIAGNOSES:  1. Intrauterine pregnancy at 33-6/[redacted] weeks gestation.  2. Cholelithiasis with gallbladder colic.  3. Urinary tract infection.  4. Preterm labor.   PROCEDURES:  1. Admission to the hospital with daily care and discharge management.  2. Interpretation of NST's daily.   HISTORY AND PHYSICAL:  Please refer to the History and Physical and  antepartum chart for details on admission to the hospital.   HOSPITAL COURSE:  Teresa [name] was admitted again with nausea and vomiting,  abdominal pain.  She was found to have positive nitrites.  She had E. coli  in her culture.  She had begun on gentamycin and responded to that.  She had  a low-grade temperature of 99.  She was then begun on Bactrim which she was  sensitive to.  She has only thrown up twice in four days here in the  hospital.  She has been using an awful lot of IV narcotic, she states,  secondary to her gallbladder colic.  She seems to be less and less willing  to interact with the staff and concerned that there is some secondary gain  for her being here so often.  Her mother was present this morning when I  talked to her.  She nodded her head and never spoke at all.  Her mom said  she is, not a morning person.  In any event, she will be seen back in the  office on August 30 as scheduled.  She is discharge to home on Bactrim.  She  will continue her Terbutaline at home as well as her Dilaudid as needed and  Phenergan and Zofran for nausea and vomiting.  If she has any problems  between now and then, she will give [place] a call.      Lazaro Arms, M.D.  Electronically Signed     LHE/MEDQ  D:  01/21/2006  T:  01/21/2006  Job:  [id number]

## 2010-10-13 NOTE — H&P (Signed)
Teresa [name], Teresa [name]           ACCOUNT NO.:  [id number]   MEDICAL RECORD NO.:  [id number]          PATIENT TYPE:  AMB   LOCATION:  DAY                           FACILITY:  [facility]   PHYSICIAN:  Dalia Heading, M.D.  DATE OF BIRTH:  Nov 27, 1985   DATE OF ADMISSION:  DATE OF DISCHARGE:  LH                                HISTORY & PHYSICAL   CHIEF COMPLAINT:  Cholecystitis, cholelithiasis.   HISTORY OF PRESENT ILLNESS:  The patient is a 25 year old white female who  is referred for evaluation and treatment of biliary colic secondary to  cholelithiasis.  She was seen by me during her pregnancy, diagnosed with  cholelithiasis at that time.  It was elected to wait to proceed with a  cholecystectomy until after the patient had delivered.  She delivered her  baby 2 weeks ago.  She has been having right upper quadrant abdominal pain,  nausea, and bloating over the last few days.  She is not breast-feeding.  She does have fatty food intolerance.  No fever, chills, jaundice have been  noted.   PAST MEDICAL HISTORY:  Is as noted above.   PAST SURGICAL HISTORY:  C-section.   CURRENT MEDICATIONS:  Darvocet as needed for pain.   ALLERGIES:  No known drug allergies.   REVIEW OF SYSTEMS:  Noncontributory.   PHYSICAL EXAMINATION:  GENERAL:  The patient is a well-developed, well-  nourished white female in no acute distress.  HEENT: Examination reveals no scleral icterus.  LUNGS:  Clear to auscultation with equal breath sounds bilaterally.  HEART:  Heart examination reveals regular rate and rhythm without S3, S4, or  murmurs.  ABDOMEN:  The abdomen is soft, nondistended.  She is tender in the right  upper quadrant to palpation.  No hepatosplenomegaly, masses, hernias are  identified.   IMPRESSION:  Cholecystitis, cholelithiasis.   PLAN:  The patient is scheduled for laparoscopic cholecystectomy with  cholangiograms on 03/04/2006.  Risks and benefits of the procedure including  bleeding,  infection, hepatobiliary, and the possibly an open procedure were  fully explained to the patient, gave informed consent.  K-Dur and Darvocet  have been prescribed.      Dalia Heading, M.D.  Electronically Signed     MAJ/MEDQ  D:  02/28/2006  T:  03/01/2006  Job:  [id number]   cc:   Dalia Heading, M.D.  Fax: [phone]   Tilda Burrow, M.D.  Fax: [phone]   Littie Deeds, M.D.

## 2010-10-13 NOTE — Discharge Summary (Signed)
NAMETAJAH, NOGUCHI           ACCOUNT NO.:  [id number]   MEDICAL RECORD NO.:  [id number]          PATIENT TYPE:  INP   LOCATION:  A415                          FACILITY:  [facility]   PHYSICIAN:  Lazaro Arms, M.D.   DATE OF BIRTH:  Apr 29, 1986   DATE OF ADMISSION:  01/12/2006  DATE OF DISCHARGE:  08/20/2007LH                                 DISCHARGE SUMMARY   DISCHARGE DIAGNOSES:  1. Pregnancy at 32+ weeks gestation.  2. Abdominal pain.  3. Nausea and vomiting.   HISTORY OF PRESENT ILLNESS:  Please refer to the history and physical for  details of admission to the hospital.   HOSPITAL COURSE:  The patient was admitted and underwent pain management, IV  hydration, nausea control.  She did not throw up at all while she was in the  hospital.  She was discharged to home two days later to take her medicine's  for nausea management at home.  She is given instructions and precautions  for return.      Lazaro Arms, M.D.  Electronically Signed     LHE/MEDQ  D:  02/07/2006  T:  02/07/2006  Job:  [id number]

## 2010-10-13 NOTE — H&P (Signed)
Teresa [name], Teresa [name]           ACCOUNT NO.:  [id number]   MEDICAL RECORD NO.:  [id number]          PATIENT TYPE:  INP   LOCATION:  LDR3                          FACILITY:  [facility]   PHYSICIAN:  Tilda Burrow, M.D. DATE OF BIRTH:  [birth year]   DATE OF ADMISSION:  01/17/2006  DATE OF DISCHARGE:  LH                                HISTORY & PHYSICAL   ADMISSION DIAGNOSES:  1. Pregnancy at 33-weeks gestation.  2. Recurrent biliary colic.  3. Mild dehydration.  4. Urinary tract infection.   HISTORY OF PRESENT ILLNESS:  This 25 year old female gravida 1, para 0, now  at 33-weeks gestation, with last menstrual period June 01, 2005, placing  menstrual [facility] March 08, 2006 with corresponding first and second trimester  ultrasounds, is admitted for the second time to labor and delivery today.  She presented earlier today with dehydration due to complaints of nausea and  vomiting with right upper quadrant pain felt to represent biliary colic. She  was discharged home with some IM Phenergan. She returned 2 hours later with  a low-grade temperature of 99+, with fetal tachycardia 160 to 180 with a  good reactive pattern. The uterus was nontender. Right upper quadrant was  tender as always.  Laboratory evaluation includes CBC which shows white  count 10,100 with 83 neutrophils. Hemoglobin is 8.8, hematocrit 25.4. The  patient has a urinalysis which on clean catch showed mild ketonuria,  specific gravity 1.015 with leukocyte esterase present and positive  nitrites. There were 7-10 white cells. A cath-urine was obtained to confirm  this and was essentially unchanged, ketonuria is 15 mg/dL. The patient is  admitted for IV antibiotic therapy. She has a documented allergy to CECLOR  (with a rash). We will avoid cephalosporins.  Await urine culture in the  meantime and initiate aminoglycosides, gentamycin has been ordered per  pharmacy protocol. This will be initiated once creatinine is  available.   PHYSICAL EXAMINATION:  GENERAL:  Exam shows a somber Caucasian female in her  usual state of mild discomfort. She perceives her pain as a 9 on a scale of  1-10 with 10 being her worst pain ever.  Once again, this is not consistent  with her overall appearance of general comfort.  HEENT:  Pupils are equal, round and reactive. No dehydration, mucous  membranes are moist.  NECK:  Supple.  ABDOMEN:  Right upper quadrant pain present, no rebound tenderness. Uterus  is nontender. No uterine contractions.  PELVIC EXAM:  Deferred at this time.   IMPRESSION:  1. Pregnancy at [redacted] weeks gestation.  2. Biliary colic.  3. Urinary tract infection, possible chronic pyelonephritis.  4. Anemia, thought nutritional in origin.   PLAN:  Antibiotics, gentamicin at this time. Await cath-urine culture and  sensitivity.      Tilda Burrow, M.D.  Electronically Signed     JVF/MEDQ  D:  01/17/2006  T:  01/17/2006  Job:  [id number]   cc:   [facility]

## 2010-10-13 NOTE — Discharge Summary (Signed)
Teresa [name], Teresa [name]           ACCOUNT NO.:  [id number]   MEDICAL RECORD NO.:  [id number]          PATIENT TYPE:  OIB   LOCATION:  LDR3                          FACILITY:  [facility]   PHYSICIAN:  Lazaro Arms, M.D.   DATE OF BIRTH:  1985-06-21   DATE OF ADMISSION:  01/10/2006  DATE OF DISCHARGE:  08/18/2007LH                                 DISCHARGE SUMMARY   DISCHARGE DIAGNOSES:  1. Intrauterine pregnancy at 32-3/7th's weeks' gestation.  2. Preterm labor.  3. Cholelithiasis and gallbladder col ick.   PROCEDURE:  Admission to the hospital, daily NST monitoring, and discharge  management.   Please refer to the history and physical in the antepartum chart for details  of admission to the hospital.   HOSPITAL COURSE:  The patient was admitted in preterm labor, was somewhat  tachycardic at the time of admission.  Dr. Emelda Fear began her on magnesium  sulfate for contractions.  She was long, thick, and closed but having  regular contractions.  She was not having specific gallbladder col ick pain.  She had a reactive NST.  No bleeding.  No gushes of fluid.  All of her  laboratory data were normal with normal lipase and amylase and white count.  She has been afebrile.  There is no evidence of any cholecystitis at this  point.  Dr. Lovell Sheehan saw the patient daily as he has been doing and as  everybody has felt not indication to remove her gallbladder at this point in  her pregnancy.  She was transitioned over to p.o. terbutaline which she did  well on.  And, the morning of discharge, January 12, 2006, I checked her  cervix and she was still long, thick, and closed and quite firm.  As a  result, she is discharged to home on p.o. terbutaline every four hours.  She  will take her oral pain medicine home for her gallbladder colic, watch her  diet, and we will see her back in the office next week for followup.      Lazaro Arms, M.D.  Electronically Signed     LHE/MEDQ  D:  01/12/2006   T:  01/12/2006  Job:  [id number]

## 2010-10-13 NOTE — H&P (Signed)
Teresa [name], Teresa [name]           ACCOUNT NO.:  [id number]   MEDICAL RECORD NO.:  [id number]          PATIENT TYPE:  INP   LOCATION:  LDR3                          FACILITY:  [facility]   PHYSICIAN:  Tilda Burrow, M.D. DATE OF BIRTH:  [birth year]   DATE OF ADMISSION:  01/10/2006  DATE OF DISCHARGE:  08/18/2007LH                                HISTORY & PHYSICAL   ADMITTING DIAGNOSES:  1. Recurrent right upper quadrant pain.  2. Pregnancy, 32 weeks.  3. Known gallstones.   HISTORY OF PRESENT ILLNESS:  This is a 25 year old female with known  gallstones, is admitted after presenting with once again complaints of upper  abdominal discomfort on the right side.  She has had a pregnancy course  notable for biliary colic with a negative liver function test, amylase, and  lipase.  She has had several episodes none of which were accompanied by  fever or abnormal liver function tests.  She has ultrasound confirmed  gallstones.  Due to advanced pregnancy and concerns over the ability to  precipitate preterm labor, none of the surgeons have felt that her symptoms  warranted surgical removal of the gallbladder at this time but rather try to  wait until postpartum status.  She was seen with abdominal discomfort.  Teresa [name] has tolerated the pains of gallstones poorly and is comforted by  being in the hospital where IV analgesics, fluids, etcetera may be  administered.   See old records for pertinent details.   The patient was admitted and found to be afebrile with liver function tests,  had lipase and amylase ordered as well as CBC.  She will be monitored,  receive analgesics, and fluid hydration over night.  We will try to resume  outpatient therapy as soon as possible.   ADDENDUM:  After a brief observation, the patient was found to be having  some regular contractions, so treat preterm labor therapy will be necessary.      Tilda Burrow, M.D.  Electronically Signed     JVF/MEDQ  D:   01/16/2006  T:  01/16/2006  Job:  [id number]   cc:   [facility]

## 2010-10-13 NOTE — Consult Note (Signed)
NAMEALAJA, GOLDINGER           ACCOUNT NO.:  [id number]   MEDICAL RECORD NO.:  [id number]          PATIENT TYPE:  OIB   LOCATION:  LDR2                          FACILITY:  [facility]   PHYSICIAN:  Tilda Burrow, M.D. DATE OF BIRTH:  Jan 31, 1986   DATE OF CONSULTATION:  01/27/2006  DATE OF DISCHARGE:  01/27/2006                                   CONSULTATION   Tauna presents to labor and delivery and once again is throwing up and  cannot keep anything down.  She is now approximately [redacted] weeks gestation.  External monitoring shows excellent fetal heart reactivity.  She is afebrile  and vital signs are normal.  She cannot give [place] a urine sample again due to  mild dehydration.   IMPRESSION:  Mild dehydration.   PLAN:  Will give fluid bolus of 3 L fluids and then allow her to return  home.  Bland diet to be continued.   Final note, observation x8 hours.      Tilda Burrow, M.D.  Electronically Signed     JVF/MEDQ  D:  01/27/2006  T:  01/28/2006  Job:  [id number]

## 2010-10-13 NOTE — Discharge Summary (Signed)
Teresa, [name]           ACCOUNT NO.:  [id number]   MEDICAL RECORD NO.:  [id number]          PATIENT TYPE:  INP   LOCATION:  A417                          FACILITY:  [facility]   PHYSICIAN:  Tilda Burrow, M.D. DATE OF BIRTH:  24-Dec-1985   DATE OF ADMISSION:  12/24/2005  DATE OF DISCHARGE:  08/01/2007LH                                 DISCHARGE SUMMARY   ADMITTING DIAGNOSES:  1. Recurrent right upper quadrant pain rule out cholelithiasis,      cholecystitis.  2. Pregnancy, 29-1/2 weeks' gestation.  3. Dehydration.   DISCHARGE DIAGNOSES:  1. Pregnancy, 29 weeks 6 days, not delivered.  2. Cholelithiasis.  3. Cholecystitis.   PROCEDURE:  Transfer to [facility] for possible  cholecystectomy.   HOSPITAL SUMMARY:  This 25 year old primiparous female due March 08, 2006,  at 29 weeks 4 days, was admitted for weight loss, persistent right upper  quadrant pain in the back, having been seen in the office on December 24, 2005,  and seen to have a 11 pound weight loss in 4 days according to the chart (?)  and a documented 5 pound weight loss from 177 to 172 since her previous  visit which had been November 13, 2005.  The patient had been recently  hospitalized July 20 to December 18, 2005, for a suspected right pyelonephritis  based on positive urinalysis and symptoms without fever but she had an  atypically slow response to the antibiotics.  She was readmitted due to  weight loss, nausea, vomiting, and fatigue.  She could not give [place] a urine  sample in the office due to dehydration.   HOSPITAL COURSE:  Admitted with weight of 172, down from 183 four days  earlier.  Blood pressure was 132/68, pulse within normal range.  She showed  mild dehydration.  She had a 29-cm fundal height, right upper quadrant pain,  positive Murphy sign, and pain that seemed to penetrate through to the back  on the right side.  She was admitted, received fluid hydration and a low fat  diet, had an  ultrasound which showed multiple gallstones and thickening of  the gallbladder.  Amylase and lipase were checked and were normal.  After  discussion it was felt that she was a candidate for a cholecystectomy.  A  surgical consult was obtained.  Dr. Malvin Johns did not feel that intervention  was desired at this time based on history.  She was therefore, transferred  to [facility] for evaluation and consideration of cholecystectomy.   FOLLOWUP:  One week [facility].   ADDENDUM:  Since transfer, December 26, 2005, the patient has been seen at  [facility] and kept there for a couple of days, discharged on December 28, 2005, and the decision was made to simply treat her with low fat diet  since  her symptoms were seemingly abating.  The patient will follow up in our  office.  She was seen on January 01, 2006, where evaluation showed her to be  remaining stable and she was discharged home for routine followup in our  office for continued pregnancy  care.      Tilda Burrow, M.D.  Electronically Signed     JVF/MEDQ  D:  01/02/2006  T:  01/02/2006  Job:  [id number]

## 2010-10-13 NOTE — H&P (Signed)
[name], FORNER           ACCOUNT NO.:  [id number]   MEDICAL RECORD NO.:  [id number]          PATIENT TYPE:  OIB   LOCATION:  LDR1                          FACILITY:  [facility]   PHYSICIAN:  Tilda Burrow, M.D. DATE OF BIRTH:  1985/07/10   DATE OF ADMISSION:  12/14/2005  DATE OF DISCHARGE:  LH                                HISTORY & PHYSICAL   CHIEF COMPLAINT:  Nausea, vomiting, right flank pain, and fever.   HISTORY OF PRESENT ILLNESS:  Teresa [name] is a 25 year old gravida 1, para 0 who  is about [redacted] weeks gestation, came in last night with the above complaints.  She did have a positive urinalysis, right CVA tenderness, a temperature of  99.5, and a white count of 12.3.  She has been vomiting throughout the day  with a lot of nausea.  She had ketones in her urine and a high specific  gravity so she was admitted for management of pyelonephritis.  Fetal heart  rate is within normal limits, no uterine activity is noted.  She has a  CECLOR allergy.  She is started on Unasyn q.6h.  Will keep her fever under  control with Tylenol and manage her pain with IV Nubain.      Jacklyn Shell, C.N.M.      Tilda Burrow, M.D.  Electronically Signed    FC/MEDQ  D:  12/14/2005  T:  12/14/2005  Job:  [id number]

## 2010-10-13 NOTE — H&P (Signed)
NAMEJASPER, Teresa [name]           ACCOUNT NO.:  [id number]   MEDICAL RECORD NO.:  [id number]          PATIENT TYPE:  INP   LOCATION:  LDR3                          FACILITY:  [facility]   PHYSICIAN:  Tilda Burrow, M.D. DATE OF BIRTH:  1985-08-29   DATE OF ADMISSION:  01/17/2006  DATE OF DISCHARGE:  LH                                HISTORY & PHYSICAL   Teresa [name] is now about [redacted] weeks gestation with known symptomatic gallstones.  She came in with complaints of feeling like she was dehydrated and just weak  all over.  She said she has been running a low-grade temp with a max of 99.8  yesterday and some right flank pain and also some right lower back pain that  she states is different from the epigastric and flank pain associated with  her gallbladder.  She did have 2+ ketones in her urine.  A cathed specimen  also revealed positive nitrites, leukocyte esterase, and 7-10 white blood  cells.  Due to what appeared to be a resistant UTI with her admission last  month, which was progressive pyelonephritis, she was started on gentamicin  while a culture is pending.  She has responded well.  CBC is within normal  limits except for an H&H of 8.8 and 25.4, although that is probably a result  of her IV fluid boluses.  We will recheck her CBC in the morning and also  add liver function tests, amylase, and lipase.  Control her pain, nausea,  and treat for UTI.      Jacklyn Shell, C.N.M.      Tilda Burrow, M.D.  Electronically Signed    FC/MEDQ  D:  01/17/2006  T:  01/17/2006  Job:  [id number]

## 2010-10-13 NOTE — Op Note (Signed)
[name], [name]           ACCOUNT NO.:  [id number]   MEDICAL RECORD NO.:  [id number]          PATIENT TYPE:  AMB   LOCATION:  DAY                           FACILITY:  [facility]   PHYSICIAN:  Dalia Heading, M.D.  DATE OF BIRTH:  28-Jun-1985   DATE OF PROCEDURE:  03/04/2006  DATE OF DISCHARGE:                                 OPERATIVE REPORT   PREOPERATIVE DIAGNOSIS:  Cholecystitis, cholelithiasis.   POSTOPERATIVE DIAGNOSIS:  Cholecystitis, cholelithiasis.   PROCEDURE:  Laparoscopic cholecystectomy.   SURGEON:  Dalia Heading, M.D.   ANESTHESIA:  General endotracheal.   INDICATIONS:  The patient is a 25 year old white female with a known history  of cholelithiasis during her recent pregnancy, who has since delivered her  baby and now presents for laparoscopic cholecystectomy.  She still has  biliary colic.  The risks and benefits of the procedure including bleeding,  infection, hepatobiliary injury, and the possibility of an open procedure  were fully explained to the patient who gave informed consent.   PROCEDURE NOTE:  The patient was placed in supine position.  After induction  of general endotracheal anesthesia, the abdomen was prepped and draped in  the usual sterile technique with Betadine.  Surgical site confirmation was  performed.   A supraumbilical incision was made down to the fascia.  A Veress needle was  introduced into the abdominal cavity, and confirmation of placement was done  using the saline drop test.  The abdomen was then insufflated to 16 mmHg  pressure.  An 11-mm trocar was introduced into the abdominal cavity under  direct visualization without difficulty.  The patient was placed in reversed  Trendelenburg position, and an additional 11-mm trocar was placed in the  epigastric region, and 5-mm trocars were placed in the right upper quadrant  right flank regions.  The liver was inspected and noted to be within normal  limits.  The gallbladder was  retracted superiorly  and laterally.  The  dissection was begun around the infundibulum of the gallbladder.  The cystic  duct was first identified.  Its juncture to the infundibulum was fully  identified.  Endoclips were placed proximally distally on the cystic duct,  and the cystic duct was divided.  This was likewise done on the cystic  artery.  The gallbladder was then freed away from the gallbladder fossa  using Bovie electrocautery.  The gallbladder was delivered through the  epigastric trocar site using an EndoCatch bag.  The gallbladder fossa was  inspected, and no abnormal bleeding or bile leakage was noted.  Surgicel was  placed in the gallbladder fossa.  All fluid and air were then evacuate from  the abdominal cavity prior to removal of the trocars.   All wounds were irrigated with normal saline.  All wounds were injected with  0.5% Sensorcaine.  The supraumbilical fascia was reapproximated using an 0  Vicryl interrupted suture.  All skin incisions were closed using staples.  Betadine ointment and dry sterile dressings were applied.   It was elected not to proceed with a cholangiogram, as the cystic duct was  very sclerotic.  All tape and needle counts were correct at the end of the procedure.  The  patient was extubated in the operating room went back to recovery room awake  and in stable condition.   COMPLICATIONS:  None.   SPECIMEN:  Gallbladder with stones.   ESTIMATED BLOOD LOSS:  Minimal.      Dalia Heading, M.D.  Electronically Signed     MAJ/MEDQ  D:  03/04/2006  T:  03/05/2006  Job:  [id number]   cc:   Tilda Burrow, M.D.  Fax: [phone]   Mila Homer. Sudie Bailey, M.D.  Fax: [phone]

## 2010-10-13 NOTE — H&P (Signed)
NAMELUCERO, AUZENNE           ACCOUNT NO.:  [id number]   MEDICAL RECORD NO.:  [id number]          PATIENT TYPE:  INP   LOCATION:  A415                          FACILITY:  [facility]   PHYSICIAN:  Lazaro Arms, M.D.   DATE OF BIRTH:  [birth year]   DATE OF ADMISSION:  01/12/2006  DATE OF DISCHARGE:  08/20/2007LH                                HISTORY & PHYSICAL   Teresa [name] is an 25 year old white female, gravida 1, para 0 who is in for her  multiple admissions this pregnancy with nausea and vomiting and pain. She  does have cholelithiasis but no cholecystitis. She has had no elevation in  her liver function tests, amylase or lipase at all during this pregnancy.  She comes in saying she has not kept anything down for 24 hours. Please  refer to previous H&Ps for other details, past medical history, etc.   PHYSICAL EXAMINATION:  She has pain in the right upper quadrant. No rebound.  Abdominal exam is otherwise benign. Cervix is long, thick and closed.   IMPRESSION:  1. Intrauterine pregnancy at 32-weeks' gestation.  2. Cholelithiasis.  3. Nausea and vomiting but with negative ketones.  4. Right upper quadrant pain.   PLAN:  Admission to the hospital, pain management and hydration.      Lazaro Arms, M.D.  Electronically Signed     LHE/MEDQ  D:  02/07/2006  T:  02/07/2006  Job:  [id number]

## 2010-10-13 NOTE — Group Therapy Note (Signed)
NAMENIOMIE, ENGLERT           ACCOUNT NO.:  [id number]   MEDICAL RECORD NO.:  [id number]          PATIENT TYPE:  OIB   LOCATION:  A415                          FACILITY:  [facility]   PHYSICIAN:  Lazaro Arms, M.D.   DATE OF BIRTH:  06/20/1985   DATE OF PROCEDURE:  DATE OF DISCHARGE:  02/12/2006                                   PROGRESS NOTE   Loza came in with complaints of epigastric pain starting right there were  her stomach is, and radiating upward and some into her back.  She has a  known history of gallstones.  She has not been on any proton pump inhibitor-  type medicine, only pain medicine.  She was evaluated for contractions, she  is not having any.  The fetal heart rate is reactive.  Her cervix is  unchanged from all previous exams, which is the 1 cm thick.  She was given a  GI cocktail and a dose of that to take home.  She was also given a  prescription for omeprazole 20 mg one p.o. daily.  If the GI cocktail helps  her a lot tonight she is going to call us in the morning for a prescription.      Jacklyn Shell, C.N.M.      Lazaro Arms, M.D.  Electronically Signed    FC/MEDQ  D:  02/12/2006  T:  02/14/2006  Job:  [id number]

## 2010-10-27 DIAGNOSIS — R55 Syncope and collapse: Secondary | ICD-10-CM

## 2010-10-27 DIAGNOSIS — I4949 Other premature depolarization: Secondary | ICD-10-CM

## 2010-10-30 ENCOUNTER — Other Ambulatory Visit: Payer: Self-pay | Admitting: *Deleted

## 2010-10-30 DIAGNOSIS — R55 Syncope and collapse: Secondary | ICD-10-CM

## 2010-10-31 ENCOUNTER — Telehealth: Payer: Self-pay | Admitting: *Deleted

## 2010-10-31 NOTE — Telephone Encounter (Addendum)
Pt called stating she is being treated by a neurologist for the same symptoms that Dr. Kirke Corin told her was being called by her heart. Pt states her neurologist (Dr. 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 She wants to know if Dr. Kirke Corin feels she needs this or if he feels this is her heart and is not warranted.    We are requested records from the patients neurologist.

## 2010-11-01 DIAGNOSIS — R55 Syncope and collapse: Secondary | ICD-10-CM

## 2010-11-03 NOTE — Telephone Encounter (Signed)
I didn't say that the passing out was from her heart. I am trying to rule that out. That is why she was given the monitor. She should keep all appointments with Neurology.

## 2010-11-03 NOTE — Telephone Encounter (Signed)
Left message to call back with person answering phone.

## 2010-11-07 ENCOUNTER — Encounter: Payer: Self-pay | Admitting: Cardiovascular Disease

## 2010-11-10 NOTE — Telephone Encounter (Signed)
Left message to call back on voice mail

## 2010-11-24 ENCOUNTER — Encounter: Payer: Self-pay | Admitting: Cardiovascular Disease

## 2010-11-24 ENCOUNTER — Ambulatory Visit (INDEPENDENT_AMBULATORY_CARE_PROVIDER_SITE_OTHER): Payer: Medicaid Other | Admitting: Cardiovascular Disease

## 2010-11-24 DIAGNOSIS — F17200 Nicotine dependence, unspecified, uncomplicated: Secondary | ICD-10-CM

## 2010-11-24 DIAGNOSIS — I493 Ventricular premature depolarization: Secondary | ICD-10-CM | POA: Insufficient documentation

## 2010-11-24 DIAGNOSIS — I4949 Other premature depolarization: Secondary | ICD-10-CM

## 2010-11-24 DIAGNOSIS — Z720 Tobacco use: Secondary | ICD-10-CM

## 2010-11-24 DIAGNOSIS — R55 Syncope and collapse: Secondary | ICD-10-CM

## 2010-11-24 NOTE — Patient Instructions (Signed)
   Will notify pt when have final monitor results.    Follow up as needed.

## 2010-11-24 NOTE — Assessment & Plan Note (Signed)
These really don't seem to be cardiac in nature. Her PVCs do not explain this. She is being followed and evaluated by neurology for possible seizures.

## 2010-11-24 NOTE — Assessment & Plan Note (Signed)
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 I asked her to cut down further on caffeine intake and also to quit smoking. Her echocardiogram showed no structural abnormalities. I don't think these required treatment at this point.  We'll wait until she is completely done with her outpatient telemetry.

## 2010-11-24 NOTE — Assessment & Plan Note (Signed)
I strongly advised her to quit smoking completely. She is now down to a few cigarettes a day.

## 2010-11-24 NOTE — Progress Notes (Signed)
HPI  This is a 25 year old female who is here today for a followup visit after she was seen recently at [facility]. At initial presentation was in fairly may after she had a syncopal episode while she was sitting in her garage. She had a loss of consciousness for about 2 minutes associated with generalized shaking and possible seizures. She had no bowel or urine incontinence. She was taken to [facility] where she was evaluated for possible seizures. CT head was negative. She had an outpatient EEG and MRI which were also unremarkable. She presented on June 1 with feeling dizzy with presyncope but did not have loss of consciousness. 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 She is known to have these PVCs since her most recent delivery. She does complain of palpitations. She denies chest pain or dyspnea. Her ECG was overall unremarkable with normal QT interval. She underwent an echocardiogram which basically came back unremarkable. She was discharged home with a 21 day outpatient telemetry. She is still wearing it now. Transmission shows PVCs without any other associated arrhythmia. The patient does have significant stress and anxiety. She has not had any syncope or presyncope since she left the hospital. She was started on Lamictal recently  Allergies  Allergen Reactions  . Cefaclor      Current Outpatient Prescriptions on File Prior to Visit  Medication Sig Dispense Refill  . LORazepam (ATIVAN) 1 MG tablet Take 1 mg by mouth every 12 (twelve) hours.        . Omeprazole Magnesium (PRILOSEC OTC PO) Take by mouth. OTC chewable antacids, up to 16 per day.       Marland Kitchen DISCONTD: albuterol (PROVENTIL, VENTOLIN) (5 MG/ML) 0.5% NEBU Take by nebulization as needed.        Marland Kitchen DISCONTD: esomeprazole (NEXIUM) 40 MG capsule Take 40 mg by mouth daily.           Past Medical History  Diagnosis Date  . Asthma   . GERD (gastroesophageal reflux disease)   . Hiatal hernia   .  Tobacco abuse   . PVC (premature ventricular contraction)      Past Surgical History  Procedure Date  . Cholecystectomy   . Cesarean section   . Esophagogastroduodenoscopy 2008     Family History  Problem Relation Age of Onset  . Ulcers Mother     Questionable history  . Liver disease Neg Hx   . Cancer Neg Hx     Colorectal cancer     History   Social History  . Marital Status: Married    Spouse Name: N/A    Number of Children: 2  . Years of Education: N/A   Occupational History  .     Social History Main Topics  . Smoking status: Current Everyday Smoker -- 0.3 packs/day  . Smokeless tobacco: Not on file  . Alcohol Use: No  . Drug Use: Not on file  . Sexually Active: Not on file   Other Topics Concern  . Not on file   Social History Narrative   Pt is doing online classes.      PHYSICAL EXAM   BP 113/77  Pulse 91  Ht 5\' 3"  (1.6 m)  Wt 221 lb 12.8 oz (100.608 kg)  BMI 39.29 kg/m2  Constitutional: She is oriented to person, place, and time. She appears well-developed and well-nourished. No distress.  HENT: No nasal discharge.  Head: Normocephalic and atraumatic.  Eyes: Pupils are equal,  round, and reactive to light. Right eye exhibits no discharge. Left eye exhibits no discharge.  Neck: Normal range of motion. Neck supple. No JVD present. No thyromegaly present.  Cardiovascular: Normal rate, regular rhythm, normal heart sounds and intact distal pulses. Exam reveals no gallop and no friction rub.  No murmur heard.  Pulmonary/Chest: Effort normal and breath sounds normal. No stridor. No respiratory distress. She has no wheezes. She has no rales. She exhibits no tenderness.  Abdominal: Soft. Bowel sounds are normal. She exhibits no distension. There is no tenderness. There is no rebound and no guarding.  Musculoskeletal: Normal range of motion. She exhibits no edema and no tenderness.  Neurological: She is alert and oriented to person, place, and time.  Coordination normal.  Skin: Skin is warm and dry. No rash noted. She is not diaphoretic. No erythema. No pallor.  Psychiatric: She has a normal mood and affect. Her behavior is normal. Judgment and thought content normal.      ASSESSMENT AND PLAN

## 2010-11-28 NOTE — Telephone Encounter (Signed)
Patient in office to see Dr. Kirke Corin on 6/29.

## 2010-12-22 ENCOUNTER — Telehealth: Payer: Self-pay | Admitting: *Deleted

## 2010-12-22 NOTE — Telephone Encounter (Signed)
Pt left message on voicemail asking for return call regarding some questions she has about her heart monitor.   Attempted to reach pt. Left message to call back on voicemail.

## 2010-12-22 NOTE — Telephone Encounter (Signed)
Pt returned call. She asked the results of her monitor. Pt notified monitor was "fine" per Dr. Kirke Corin. Pt then asked if she should continue the Lamictal and Lorazepam. Pt states her neurologist prescribed those medications. Pt notified to discuss meds with neurologist as he prescribed those. Pt said ok and disconnected the call.

## 2011-06-27 ENCOUNTER — Encounter: Payer: Self-pay | Admitting: Internal Medicine

## 2011-06-28 ENCOUNTER — Ambulatory Visit: Payer: Self-pay | Admitting: Gastroenterology

## 2011-07-04 ENCOUNTER — Ambulatory Visit (INDEPENDENT_AMBULATORY_CARE_PROVIDER_SITE_OTHER): Payer: Medicaid Other | Admitting: Gastroenterology

## 2011-07-04 ENCOUNTER — Encounter: Payer: Self-pay | Admitting: Gastroenterology

## 2011-07-04 VITALS — BP 110/80 | HR 75 | Temp 98.2°F | Ht 63.0 in | Wt 228.8 lb

## 2011-07-04 DIAGNOSIS — R1314 Dysphagia, pharyngoesophageal phase: Secondary | ICD-10-CM

## 2011-07-04 DIAGNOSIS — R197 Diarrhea, unspecified: Secondary | ICD-10-CM

## 2011-07-04 DIAGNOSIS — R1013 Epigastric pain: Secondary | ICD-10-CM

## 2011-07-04 DIAGNOSIS — K219 Gastro-esophageal reflux disease without esophagitis: Secondary | ICD-10-CM

## 2011-07-04 DIAGNOSIS — R1310 Dysphagia, unspecified: Secondary | ICD-10-CM

## 2011-07-04 DIAGNOSIS — K625 Hemorrhage of anus and rectum: Secondary | ICD-10-CM | POA: Insufficient documentation

## 2011-07-04 MED ORDER — DEXLANSOPRAZOLE 60 MG PO CPDR: 60.0000 mg | DELAYED_RELEASE_CAPSULE | Freq: Every day | ORAL | Status: DC

## 2011-07-04 NOTE — Assessment & Plan Note (Signed)
Severe, refractory GERD, esophageal dysphagia, epigastric pain. Add Dexilant 60mg  daily. Previously has failed multiple PPIs including omeprazole 20mg  bid, zegerid, aciphex.   EGD in near future with possible dilation. Previously had incomplete EGD as patient became combative at time of conscious sedation. Plan on deep sedation in OR.  I have discussed the risks, alternatives, benefits with regards to but not limited to the risk of reaction to medication, bleeding, infection, perforation and the patient is agreeable to proceed. Written consent to be obtained.

## 2011-07-04 NOTE — Assessment & Plan Note (Signed)
Offered patient colonoscopy for further evaluation of rectal bleeding. She declined but stated if she had recurrent bleeding she may reconsider.

## 2011-07-04 NOTE — Progress Notes (Signed)
Primary Care Physician:  Milana Obey, MD, MD  Primary Gastroenterologist:  Roetta Sessions, MD   Chief Complaint  Patient presents with  . Abdominal Pain  . Rectal Bleeding    HPI:  Teresa [name] is a 26 y.o. female here for complaints of heartburn, abdominal pain, rectal bleeding. Has not been seen in couple of years or more. We were planning for EGD in late 2010 however at pre-op appointment she had positive pregnancy test so procedure was cancelled.  Heartburn really bad over the past two years. Ranitidine 150mg  bid. Previously failed Zegerid, omeprazole 40mg  daily, aciphex. Was eating tums 2-3 rolls per day. Heartburn all the time. Feels like food stuck, gags and throws up a lot. H/O hemorrhoids, swollen and hurting last week. Some brbpr on toilet tissue for couple of days. No more problems since then. BM every day, 1-2. Stools are not hard. Epigastric pain all the day. Sometimes worse with meals. No weight loss. No prior TCS.   Current Outpatient Prescriptions  Medication Sig Dispense Refill  . hydrocodone-acetaminophen (LORCET-HD) 5-500 MG per capsule Take 1 capsule by mouth every 6 (six) hours as needed.      .        .       . Omeprazole Magnesium (PRILOSEC OTC PO) Take by mouth. OTC chewable antacids, up to 16 per day.         Allergies as of 07/04/2011 - Review Complete 07/04/2011  Allergen Reaction Noted  . Cefaclor      Past Medical History  Diagnosis Date  . Asthma   . GERD (gastroesophageal reflux disease)   . Hiatal hernia   . Tobacco abuse   . PVC (premature ventricular contraction)   . DM (diabetes mellitus)     Past Surgical History  Procedure Date  . Cholecystectomy 2007  . Cesarean section 2007 and 2011  . Esophagogastroduodenoscopy 2008    incomplete due to inadequate conscious sedation, circumferential distal esophageal erosions, hiatal hernia  . Tubal ligation 2011    Family History  Problem Relation Age of Onset  . Ulcers Mother    Questionable history  . Liver disease Neg Hx   . Cancer Neg Hx     Colorectal cancer    History   Social History  . Marital Status: Married    Spouse Name: N/A    Number of Children: 3  . Years of Education: N/A   Occupational History  .     Social History Main Topics  . Smoking status: Current Everyday Smoker -- 0.3 packs/day  . Smokeless tobacco: Not on file  . Alcohol Use: No  . Drug Use: Not on file  . Sexually Active: Not on file   Other Topics Concern  . Not on file   Social History Narrative   Pt is doing online classes.      ROS:  General: Negative for anorexia, weight loss, fever, chills, fatigue, weakness. Eyes: Negative for vision changes.  ENT: Negative for hoarseness, nasal congestion. CV: Negative for chest pain, angina, palpitations, dyspnea on exertion, peripheral edema.  Respiratory: Negative for dyspnea at rest, dyspnea on exertion, cough, sputum, wheezing.  GI: See history of present illness. GU:  Negative for dysuria, hematuria, urinary incontinence, urinary frequency, nocturnal urination. C/O uterine pain and plans on hysterectomy in couple of months. MS: Negative for joint pain, low back pain.  Derm: Negative for rash or itching.  Neuro: Negative for weakness, abnormal sensation, seizure, frequent headaches, memory loss, confusion.  Psych: Negative for anxiety, depression, suicidal ideation, hallucinations.  Endo: Negative for unusual weight change.  Heme: Negative for bruising or bleeding. Allergy: Negative for rash or hives.    Physical Examination:  BP 110/80  Pulse 75  Temp(Src) 98.2 F (36.8 C) (Temporal)  Ht 5\' 3"  (1.6 m)  Wt 228 lb 12.8 oz (103.783 kg)  BMI 40.53 kg/m2  LMP 06/29/2011   General: Well-nourished, well-developed in no acute distress.  Head: Normocephalic, atraumatic.   Eyes: Conjunctiva pink, no icterus. Mouth: Oropharyngeal mucosa moist and pink , no lesions erythema or exudate. Neck: Supple without  thyromegaly, masses, or lymphadenopathy.  Lungs: Clear to auscultation bilaterally.  Heart: Regular rate and rhythm, no murmurs rubs or gallops.  Abdomen: Bowel sounds are normal, epigastric tenderness, nondistended, no hepatosplenomegaly or masses, no abdominal bruits or    hernia , no rebound or guarding.   Rectal: Patient declined. Extremities: No lower extremity edema. No clubbing or deformities.  Neuro: Alert and oriented x 4 , grossly normal neurologically.  Skin: Warm and dry, no rash or jaundice.   Psych: Alert and cooperative, normal mood and affect.

## 2011-07-04 NOTE — Patient Instructions (Addendum)
We have scheduled you for an upper endoscopy. Please see separate instructions. Please start Dexilant 60mg  daily, 30 minutes before breakfast. We have provided you with samples.  Gastroesophageal Reflux Disease, Adult Gastroesophageal reflux disease (GERD) happens when acid from your stomach goes into your food pipe (esophagus). The acid can cause a burning feeling in your chest. Over time, the acid can make small holes (ulcers) in your food pipe.  HOME CARE  Ask your doctor for advice about:   Losing weight.   Quitting smoking.   Alcohol use.   Avoid foods and drinks that make your problems worse. You may want to avoid:   Caffeine and alcohol.   Chocolate.   Mints.   Garlic and onions.   Spicy foods.   Citrus fruits, such as oranges, lemons, or limes.   Foods that contain tomato, such as sauce, chili, salsa, and pizza.   Fried and fatty foods.   Avoid lying down for 3 hours before you go to bed or before you take a nap.   Eat small meals often, instead of large meals.   Wear loose-fitting clothing. Do not wear anything tight around your waist.   Raise (elevate) the head of your bed 6 to 8 inches with wood blocks. Using extra pillows does not help.   Only take medicines as told by your doctor.   Do not take aspirin or ibuprofen.  GET HELP RIGHT AWAY IF:   You have pain in your arms, neck, jaw, teeth, or back.   Your pain gets worse or changes.   You feel sick to your stomach (nauseous), throw up (vomit), or sweat (diaphoresis).   You feel short of breath, or you pass out (faint).   Your throw up is green, yellow, black, or looks like coffee grounds or blood.   Your poop (stool) is red, bloody, or black.  MAKE SURE YOU:   Understand these instructions.   Will watch your condition.   Will get help right away if you are not doing well or get worse.  Document Released: 10/31/2007 Document Revised: 01/24/2011 Document Reviewed: 12/01/2010 [facility] Patient  Information 2012 [place], [place].

## 2011-07-05 NOTE — Progress Notes (Signed)
Faxed to PCP

## 2011-07-10 ENCOUNTER — Encounter (HOSPITAL_COMMUNITY): Payer: Self-pay | Admitting: Pharmacy Technician

## 2011-07-19 ENCOUNTER — Encounter (HOSPITAL_COMMUNITY): Payer: Self-pay

## 2011-07-19 ENCOUNTER — Encounter (HOSPITAL_COMMUNITY)
Admission: RE | Admit: 2011-07-19 | Discharge: 2011-07-19 | Disposition: A | Discharge status: Home / Self Care | Payer: Medicaid Other | Source: Ambulatory Visit | Attending: Internal Medicine | Admitting: Internal Medicine

## 2011-07-19 HISTORY — DX: Hypothyroidism, unspecified: E03.9

## 2011-07-19 LAB — BASIC METABOLIC PANEL
CO2: 27 mEq/L (ref 19–32)
Chloride: 104 mEq/L (ref 96–112)
GFR calc non Af Amer: >90 mL/min (ref >90)
Glucose, Bld: 77 mg/dL (ref 70–99)
Potassium: 4.1 mEq/L (ref 3.5–5.1)
Sodium: 139 mEq/L (ref 135–145)

## 2011-07-19 LAB — HEMOGLOBIN AND HEMATOCRIT, BLOOD: HCT: 40.8 % (ref 36.0–46.0)

## 2011-07-19 NOTE — Patient Instructions (Signed)
20 PHALA SCHRAEDER  07/19/2011   Your procedure is scheduled on:  07/26/11  Report to [facility] at 07:00 AM.  Call this number if you have problems the morning of surgery: [phone]   Remember:   Do not eat food:After Midnight.  May have clear liquids:until Midnight .  Clear liquids include soda, tea, black coffee, apple or grape juice, broth.  Take these medicines the morning of surgery with A SIP OF WATER: Ranitidine   Do not wear jewelry, make-up or nail polish.  Do not wear lotions, powders, or perfumes. You may wear deodorant.  Do not shave 48 hours prior to surgery.  Do not bring valuables to the hospital.  Contacts, dentures or bridgework may not be worn into surgery.  Leave suitcase in the car. After surgery it may be brought to your room.  For patients admitted to the hospital, checkout time is 11:00 AM the day of discharge.   Patients discharged the day of surgery will not be allowed to drive home.  Name and phone number of your driver:   Special Instructions: N/A   Please read over the following fact sheets that you were given: Pain Booklet, MRSA Information, Surgical Site Infection Prevention, Anesthesia Post-op Instructions and Care and Recovery After Surgery    Esophagogastroduodenoscopy This is an endoscopic procedure (a procedure that uses a device like a flexible telescope) that allows your caregiver to view the upper stomach and small bowel. This test allows your caregiver to look at the esophagus. The esophagus carries food from your mouth to your stomach. They can also look at your duodenum. This is the first part of the small intestine that attaches to the stomach. This test is used to detect problems in the bowel such as ulcers and inflammation. PREPARATION FOR TEST Nothing to eat after midnight the day before the test. NORMAL FINDINGS Normal esophagus, stomach, and duodenum. Ranges for normal findings may vary among different laboratories and hospitals. You  should always check with your doctor after having lab work or other tests done to discuss the meaning of your test results and whether your values are considered within normal limits. MEANING OF TEST  Your caregiver will go over the test results with you and discuss the importance and meaning of your results, as well as treatment options and the need for additional tests if necessary. OBTAINING THE TEST RESULTS It is your responsibility to obtain your test results. Ask the lab or department performing the test when and how you will get your results. Document Released: 09/14/2004 Document Revised: 01/24/2011 Document Reviewed: 04/23/2008 [facility] Patient Information 2012 [place], [place].   Esophageal Dilatation The esophagus is the long, narrow tube which carries food and liquid from the mouth to the stomach. Esophageal dilatation is the technique used to stretch a blocked or narrowed portion of the esophagus. This procedure is used when a part of the esophagus has become so narrow that it becomes difficult, painful or even impossible to swallow. This is generally an uncomplicated form of treatment. When this is not successful, chest surgery may be required. This is a much more extensive form of treatment with a longer recovery time. CAUSES  Some of the more common causes of blockage or strictures of the esophagus are:  Narrowing from longstanding inflammation (soreness and redness) of the lower esophagus. This comes from the constant exposure of the lower esophagus to the acid which bubbles up from the stomach. Over time this causes scarring and narrowing of the  lower esophagus.   Hiatal hernia in which a small part of the stomach bulges (herniates) up through the diaphragm. This can cause a gradual narrowing of the end of the esophagus.   Schatzki's Ring is a narrow ring of benign (non-cancerous) fibrous tissue which constricts the lower esophagus. The reason for this is not known.   Scleroderma  is a connective tissue disorder that affects the esophagus and makes swallowing difficult.   Achalasia is an absence of nerves to the lower esophagus and to the esophageal sphincter. This is the circular muscle between the stomach and esophagus that relaxes to allow food into the stomach. After swallowing, it contracts to keep food in the stomach. This absence of nerves may be congenital (present since birth). This can cause irregular spasms of the lower esophageal muscle. This spasm does not open up to allow food and fluid through. The result is a persistent blockage with subsequent slow trickling of the esophageal contents into the stomach.   Strictures may develop from swallowing materials which damage the esophagus. Some examples are strong acids or alkalis such as lye.   Growths such as benign (non-cancerous) and malignant (cancerous) tumors can block the esophagus.   Heredity (present since birth) causes.  DIAGNOSIS  Your caregiver often suspects this problem by taking a medical history. They will also do a physical exam. They can then prove their suspicions using X-rays and endoscopy. Endoscopy is an exam in which a tube like a small flexible telescope is used to look at your esophagus.  TREATMENT There are different stretching (dilating) techniques which can be used. Simple bougie dilatation may be done in the office. This usually takes only a couple minutes. A numbing (anesthetic) spray of the throat is used. Endoscopy, when done, is done in an endoscopy suite, under mild sedation. When fluoroscopy is used, the procedure is performed in X-ray. Other techniques require a little longer time. Recovery is usually quick. There is no waiting time to begin eating and drinking to test success of the treatment. Following are some of the methods used. Narrowing of the esophagus is treated by making it bigger. Commonly this is a mechanical problem which can be treated with stretching. This can be done in  different ways. Your caregiver will discuss these with you. Some of the means used are:  A series of graduated (increasing thickness) flexible dilators can be used. These are weighted tubes passed through the esophagus into the stomach. The tubes used become progressively larger until the desired stretched size is reached. Graduated dilators are a simple and quick way of opening the esophagus. No visualization is required.   Another method is the use of endoscopy to place a flexible wire across the stricture. The endoscope is removed and the wire left in place. A dilator with a hole through it from end to end is guided down the esophagus and across the stricture. One or more of these dilators are passed over the wire. At the end of the exam, the wire is removed. This type of treatment may be performed in the X-ray department under fluoroscopy. An advantage of this procedure is the examiner is visualizing the end opening in the esophagus.   Stretching of the esophagus may be done using balloons. Deflated balloons are placed through the endoscope and across the stricture. This type of balloon dilatation is often done at the time of endoscopy or fluoroscopy. Flexible endoscopy allows the examiner to directly view the stricture. A balloon is inserted in  the deflated form into the area of narrowing. It is then inflated with air to a certain pressure that is pre-set for a given circumference. When inflated, it becomes sausage shaped, stretched, and makes the stricture larger.   Achalasia requires a longer larger balloon-type dilator. This is frequently done under X-ray control. In this situation, the spastic muscle fibers in the lower esophagus are stretched.  All of the above procedures make the passage of food and water into the stomach easier. They also make it easier for stomach contents to reflux back into the esophagus. Special medications may be used following the procedure to help prevent further  stricturing. Proton-pump inhibitor medications are good at decreasing the amount of acid in the stomach juice. When stomach juice refluxes into the esophagus, the juice is no longer as acidic and is less likely to burn or scar the esophagus. RISKS AND COMPLICATIONS Esophageal dilatation is usually performed effectively and without problems. Some complications that can occur are:  A small amount of bleeding almost always happens where the stretching takes place. If this is too excessive it may require more aggressive treatment.   An uncommon complication is perforation (making a hole) of the esophagus. The esophagus is thin. It is easy to make a hole in it. If this happens, an operation may be necessary to repair this.   A small, undetected perforation could lead to an infection in the chest. This can be very serious.  HOME CARE INSTRUCTIONS   If you received sedation for your procedure, do not drive, make important decisions, or perform any activities requiring your full coordination. Do not drink alcohol, take sedatives, or use any mind altering chemicals unless instructed by your caregiver.   You may use throat lozenges or warm salt water gargles if you have throat discomfort   You can begin eating and drinking normally on return home unless instructed otherwise. Do not purposely try to force large chunks of food down to test the benefits of your procedure.   Mild discomfort can be eased with sips of ice water.   Medications for discomfort may or may not be needed.  SEEK IMMEDIATE MEDICAL CARE IF:   You begin vomiting up blood.   You develop black tarry stools   You develop chills or an unexplained temperature of over 101 F (38.3 C)   You develop chest or abdominal pain.   You develop shortness of breath or feel lightheaded or faint.   Your swallowing is becoming more painful, difficult, or you are unable to swallow.  MAKE SURE YOU:   Understand these instructions.   Will  watch your condition.   Will get help right away if you are not doing well or get worse.  Document Released: 07/05/2005 Document Revised: 01/24/2011 Document Reviewed: 08/22/2005 [employer] Patient Information 2012 [place], [place].   PATIENT INSTRUCTIONS POST-ANESTHESIA  IMMEDIATELY FOLLOWING SURGERY:  Do not drive or operate machinery for the first twenty four hours after surgery.  Do not make any important decisions for twenty four hours after surgery or while taking narcotic pain medications or sedatives.  If you develop intractable nausea and vomiting or a severe headache please notify your doctor immediately.  FOLLOW-UP:  Please make an appointment with your surgeon as instructed. You do not need to follow up with anesthesia unless specifically instructed to do so.  WOUND CARE INSTRUCTIONS (if applicable):  Keep a dry clean dressing on the anesthesia/puncture wound site if there is drainage.  Once the wound has quit  draining you may leave it open to air.  Generally you should leave the bandage intact for twenty four hours unless there is drainage.  If the epidural site drains for more than 36-48 hours please call the anesthesia department.  QUESTIONS?:  Please feel free to call your physician or the hospital operator if you have any questions, and they will be happy to assist you.     [facility] Anesthesia Department [address] [place] [phone]

## 2011-07-26 ENCOUNTER — Encounter (HOSPITAL_COMMUNITY): Payer: Self-pay | Admitting: *Deleted

## 2011-07-26 ENCOUNTER — Ambulatory Visit (HOSPITAL_COMMUNITY)
Admission: RE | Admit: 2011-07-26 | Discharge: 2011-07-26 | Disposition: A | Discharge status: Home / Self Care | Payer: Medicaid Other | Source: Ambulatory Visit | Attending: Internal Medicine | Admitting: Internal Medicine

## 2011-07-26 ENCOUNTER — Ambulatory Visit (HOSPITAL_COMMUNITY): Payer: Medicaid Other | Admitting: Anesthesiology

## 2011-07-26 ENCOUNTER — Encounter (HOSPITAL_COMMUNITY): Payer: Self-pay | Admitting: Anesthesiology

## 2011-07-26 ENCOUNTER — Encounter (HOSPITAL_COMMUNITY)
Admission: RE | Disposition: A | Discharge status: Home / Self Care | Payer: Self-pay | Source: Ambulatory Visit | Attending: Internal Medicine

## 2011-07-26 DIAGNOSIS — R1310 Dysphagia, unspecified: Secondary | ICD-10-CM | POA: Insufficient documentation

## 2011-07-26 DIAGNOSIS — K2100 Gastro-esophageal reflux disease with esophagitis, without bleeding: Secondary | ICD-10-CM | POA: Insufficient documentation

## 2011-07-26 DIAGNOSIS — E119 Type 2 diabetes mellitus without complications: Secondary | ICD-10-CM | POA: Insufficient documentation

## 2011-07-26 DIAGNOSIS — K449 Diaphragmatic hernia without obstruction or gangrene: Secondary | ICD-10-CM

## 2011-07-26 DIAGNOSIS — Z9283 Personal history of failed moderate sedation: Secondary | ICD-10-CM | POA: Insufficient documentation

## 2011-07-26 DIAGNOSIS — R1013 Epigastric pain: Secondary | ICD-10-CM | POA: Insufficient documentation

## 2011-07-26 HISTORY — PX: MALONEY DILATION: SHX5535

## 2011-07-26 HISTORY — PX: BIOPSY: SHX5522

## 2011-07-26 SURGERY — ESOPHAGOGASTRODUODENOSCOPY (EGD) WITH PROPOFOL
Anesthesia: Monitor Anesthesia Care

## 2011-07-26 MED ORDER — MIDAZOLAM HCL 5 MG/5ML IJ SOLN
INTRAMUSCULAR | Status: DC | PRN
  Administered 2011-07-26: 2 mg via INTRAVENOUS

## 2011-07-26 MED ORDER — ONDANSETRON HCL 4 MG/2ML IJ SOLN
4.0000 mg | Freq: Once | INTRAMUSCULAR | Status: AC | PRN
  Administered 2011-07-26: 4 mg via INTRAVENOUS

## 2011-07-26 MED ORDER — PROPOFOL 10 MG/ML IV EMUL
INTRAVENOUS | Status: AC
  Filled 2011-07-26: qty 20

## 2011-07-26 MED ORDER — LIDOCAINE HCL (PF) 1 % IJ SOLN
INTRAMUSCULAR | Status: AC
  Filled 2011-07-26: qty 5

## 2011-07-26 MED ORDER — STERILE WATER FOR IRRIGATION IR SOLN
Status: DC | PRN
  Administered 2011-07-26: 09:00:00

## 2011-07-26 MED ORDER — GLYCOPYRROLATE 0.2 MG/ML IJ SOLN
INTRAMUSCULAR | Status: AC
  Administered 2011-07-26: 0.2 mg via INTRAVENOUS
  Filled 2011-07-26: qty 1

## 2011-07-26 MED ORDER — PROPOFOL 10 MG/ML IV EMUL
INTRAVENOUS | Status: DC | PRN
  Administered 2011-07-26: 100 ug/kg/min via INTRAVENOUS

## 2011-07-26 MED ORDER — GLYCOPYRROLATE 0.2 MG/ML IJ SOLN
0.2000 mg | Freq: Once | INTRAMUSCULAR | Status: AC
  Administered 2011-07-26: 0.2 mg via INTRAVENOUS

## 2011-07-26 MED ORDER — STERILE WATER FOR IRRIGATION IR SOLN
Status: DC | PRN
  Administered 2011-07-26: 1000 mL

## 2011-07-26 MED ORDER — MIDAZOLAM HCL 2 MG/2ML IJ SOLN
INTRAMUSCULAR | Status: AC
  Administered 2011-07-26: 2 mg via INTRAVENOUS
  Filled 2011-07-26: qty 2

## 2011-07-26 MED ORDER — FENTANYL CITRATE 0.05 MG/ML IJ SOLN: 25.0000 ug | INTRAMUSCULAR | Status: DC | PRN

## 2011-07-26 MED ORDER — LACTATED RINGERS IV SOLN
INTRAVENOUS | Status: DC
  Administered 2011-07-26: 1000 mL via INTRAVENOUS

## 2011-07-26 MED ORDER — ONDANSETRON HCL 4 MG/2ML IJ SOLN
INTRAMUSCULAR | Status: AC
  Administered 2011-07-26: 4 mg via INTRAVENOUS
  Filled 2011-07-26: qty 2

## 2011-07-26 MED ORDER — MIDAZOLAM HCL 2 MG/2ML IJ SOLN
INTRAMUSCULAR | Status: AC
  Filled 2011-07-26: qty 2

## 2011-07-26 MED ORDER — PROPOFOL 10 MG/ML IV BOLUS
INTRAVENOUS | Status: DC | PRN
  Administered 2011-07-26 (×3): 20 mg via INTRAVENOUS

## 2011-07-26 MED ORDER — LACTATED RINGERS IV SOLN: INTRAVENOUS | Status: DC

## 2011-07-26 MED ORDER — MIDAZOLAM HCL 2 MG/2ML IJ SOLN
1.0000 mg | INTRAMUSCULAR | Status: DC | PRN
  Administered 2011-07-26: 2 mg via INTRAVENOUS

## 2011-07-26 SURGICAL SUPPLY — 18 items
BLOCK BITE 60FR ADLT L/F BLUE (MISCELLANEOUS) ×3 IMPLANT
DEVICE CLIP HEMOSTAT 235CM (CLIP) IMPLANT
ELECT REM PT RETURN 9FT ADLT (ELECTROSURGICAL)
ELECTRODE REM PT RTRN 9FT ADLT (ELECTROSURGICAL) IMPLANT
FLOOR PAD 36X40 (MISCELLANEOUS) ×3
FORCEP RJ3 GP 1.8X160 W-NEEDLE (CUTTING FORCEPS) IMPLANT
FORCEPS BIOP RAD 4 LRG CAP 4 (CUTTING FORCEPS) ×3 IMPLANT
MANIFOLD NEPTUNE WASTE (CANNULA) ×3 IMPLANT
NEEDLE SCLEROTHERAPY 25GX240 (NEEDLE) IMPLANT
PAD FLOOR 36X40 (MISCELLANEOUS) ×2 IMPLANT
PROBE APC STR FIRE (PROBE) IMPLANT
PROBE INJECTION GOLD (MISCELLANEOUS)
PROBE INJECTION GOLD 7FR (MISCELLANEOUS) IMPLANT
SNARE ROTATE MED OVAL 20MM (MISCELLANEOUS) IMPLANT
SYR 50ML LL SCALE MARK (SYRINGE) ×3 IMPLANT
TUBING ENDO SMARTCAP PENTAX (MISCELLANEOUS) ×3 IMPLANT
TUBING IRRIGATION ENDOGATOR (MISCELLANEOUS) ×3 IMPLANT
WATER STERILE IRR 1000ML POUR (IV SOLUTION) ×6 IMPLANT

## 2011-07-26 NOTE — Interval H&P Note (Signed)
History and Physical Interval Note:  07/26/2011 8:27 AM  Teresa [name]  has presented today for surgery, with the diagnosis of dysphagia  The various methods of treatment have been discussed with the patient and family. After consideration of risks, benefits and other options for treatment, the patient has consented to  Procedure(s) (LRB): ESOPHAGOGASTRODUODENOSCOPY (EGD) WITH PROPOFOL (N/A) MALONEY DILATION (N/A) as a surgical intervention .  The patients' history has been reviewed, patient examined, no change in status, stable for surgery.  I have reviewed the patients' chart and labs.  Questions were answered to the patient's satisfaction.     Eula Listen

## 2011-07-26 NOTE — H&P (View-Only) (Signed)
Primary Care Physician:  KNOWLTON,STEPHEN D, MD, MD  Primary Gastroenterologist:  Michael Rourk, MD   Chief Complaint  Patient presents with  . Abdominal Pain  . Rectal Bleeding    HPI:  Teresa [name] is a 26 y.o. female here for complaints of heartburn, abdominal pain, rectal bleeding. Has not been seen in couple of years or more. We were planning for EGD in late 2010 however at pre-op appointment she had positive pregnancy test so procedure was cancelled.  Heartburn really bad over the past two years. Ranitidine 150mg bid. Previously failed Zegerid, omeprazole 40mg daily, aciphex. Was eating tums 2-3 rolls per day. Heartburn all the time. Feels like food stuck, gags and throws up a lot. H/O hemorrhoids, swollen and hurting last week. Some brbpr on toilet tissue for couple of days. No more problems since then. BM every day, 1-2. Stools are not hard. Epigastric pain all the day. Sometimes worse with meals. No weight loss. No prior TCS.   Current Outpatient Prescriptions  Medication Sig Dispense Refill  . hydrocodone-acetaminophen (LORCET-HD) 5-500 MG per capsule Take 1 capsule by mouth every 6 (six) hours as needed.      .        .       . Omeprazole Magnesium (PRILOSEC OTC PO) Take by mouth. OTC chewable antacids, up to 16 per day.         Allergies as of 07/04/2011 - Review Complete 07/04/2011  Allergen Reaction Noted  . Cefaclor      Past Medical History  Diagnosis Date  . Asthma   . GERD (gastroesophageal reflux disease)   . Hiatal hernia   . Tobacco abuse   . PVC (premature ventricular contraction)   . DM (diabetes mellitus)     Past Surgical History  Procedure Date  . Cholecystectomy 2007  . Cesarean section 2007 and 2011  . Esophagogastroduodenoscopy 2008    incomplete due to inadequate conscious sedation, circumferential distal esophageal erosions, hiatal hernia  . Tubal ligation 2011    Family History  Problem Relation Age of Onset  . Ulcers Mother    Questionable history  . Liver disease Neg Hx   . Cancer Neg Hx     Colorectal cancer    History   Social History  . Marital Status: Married    Spouse Name: N/A    Number of Children: 3  . Years of Education: N/A   Occupational History  .     Social History Main Topics  . Smoking status: Current Everyday Smoker -- 0.3 packs/day  . Smokeless tobacco: Not on file  . Alcohol Use: No  . Drug Use: Not on file  . Sexually Active: Not on file   Other Topics Concern  . Not on file   Social History Narrative   Pt is doing online classes.      ROS:  General: Negative for anorexia, weight loss, fever, chills, fatigue, weakness. Eyes: Negative for vision changes.  ENT: Negative for hoarseness, nasal congestion. CV: Negative for chest pain, angina, palpitations, dyspnea on exertion, peripheral edema.  Respiratory: Negative for dyspnea at rest, dyspnea on exertion, cough, sputum, wheezing.  GI: See history of present illness. GU:  Negative for dysuria, hematuria, urinary incontinence, urinary frequency, nocturnal urination. C/O uterine pain and plans on hysterectomy in couple of months. MS: Negative for joint pain, low back pain.  Derm: Negative for rash or itching.  Neuro: Negative for weakness, abnormal sensation, seizure, frequent headaches, memory loss, confusion.    Psych: Negative for anxiety, depression, suicidal ideation, hallucinations.  Endo: Negative for unusual weight change.  Heme: Negative for bruising or bleeding. Allergy: Negative for rash or hives.    Physical Examination:  BP 110/80  Pulse 75  Temp(Src) 98.2 F (36.8 C) (Temporal)  Ht 5' 3" (1.6 m)  Wt 228 lb 12.8 oz (103.783 kg)  BMI 40.53 kg/m2  LMP 06/29/2011   General: Well-nourished, well-developed in no acute distress.  Head: Normocephalic, atraumatic.   Eyes: Conjunctiva pink, no icterus. Mouth: Oropharyngeal mucosa moist and pink , no lesions erythema or exudate. Neck: Supple without  thyromegaly, masses, or lymphadenopathy.  Lungs: Clear to auscultation bilaterally.  Heart: Regular rate and rhythm, no murmurs rubs or gallops.  Abdomen: Bowel sounds are normal, epigastric tenderness, nondistended, no hepatosplenomegaly or masses, no abdominal bruits or    hernia , no rebound or guarding.   Rectal: Patient declined. Extremities: No lower extremity edema. No clubbing or deformities.  Neuro: Alert and oriented x 4 , grossly normal neurologically.  Skin: Warm and dry, no rash or jaundice.   Psych: Alert and cooperative, normal mood and affect.    

## 2011-07-26 NOTE — Transfer of Care (Signed)
Immediate Anesthesia Transfer of Care Note  Patient: Teresa [name]  Procedure(s) Performed: Procedure(s) (LRB): ESOPHAGOGASTRODUODENOSCOPY (EGD) WITH PROPOFOL (N/A) MALONEY DILATION (N/A)  Patient Location: PACU  Anesthesia Type: MAC  Level of Consciousness: awake, alert  and oriented  Airway & Oxygen Therapy: Patient Spontanous Breathing and Patient connected to face mask oxygen  Post-op Assessment: Report given to PACU RN  Post vital signs: Reviewed and stable  Complications: No apparent anesthesia complications

## 2011-07-26 NOTE — Discharge Instructions (Addendum)
EGD Discharge instructions Please read the instructions outlined below and refer to this sheet in the next few weeks. These discharge instructions provide you with general information on caring for yourself after you leave the hospital. Your doctor may also give you specific instructions. While your treatment has been planned according to the most current medical practices available, unavoidable complications occasionally occur. If you have any problems or questions after discharge, please call your doctor. ACTIVITY  You may resume your regular activity but move at a slower pace for the next 24 hours.   Take frequent rest periods for the next 24 hours.   Walking will help expel (get rid of) the air and reduce the bloated feeling in your abdomen.   No driving for 24 hours (because of the anesthesia (medicine) used during the test).   You may shower.   Do not sign any important legal documents or operate any machinery for 24 hours (because of the anesthesia used during the test).  NUTRITION  Drink plenty of fluids.   You may resume your normal diet.   Begin with a light meal and progress to your normal diet.   Avoid alcoholic beverages for 24 hours or as instructed by your caregiver.  MEDICATIONS  You may resume your normal medications unless your caregiver tells you otherwise.  WHAT YOU CAN EXPECT TODAY  You may experience abdominal discomfort such as a feeling of fullness or "gas" pains.  FOLLOW-UP  Your doctor will discuss the results of your test with you.  SEEK IMMEDIATE MEDICAL ATTENTION IF ANY OF THE FOLLOWING OCCUR:  Excessive nausea (feeling sick to your stomach) and/or vomiting.   Severe abdominal pain and distention (swelling).   Trouble swallowing.   Temperature over 101 F (37.8 C).   Rectal bleeding or vomiting of blood.     Continue Dexilant 60 mg orally daily  GERD information provided  Further recommendations to follow pending review of pathology  report  Office visit with [place] in 5 months Diet for GERD or PUD Nutrition therapy can help ease the discomfort of gastroesophageal reflux disease (GERD) and peptic ulcer disease (PUD).  HOME CARE INSTRUCTIONS   Eat your meals slowly, in a relaxed setting.   Eat 5 to 6 small meals per day.   If a food causes distress, stop eating it for a period of time.  FOODS TO AVOID  Coffee, regular or decaffeinated.   Cola beverages, regular or low calorie.   Tea, regular or decaffeinated.   Pepper.   Cocoa.   High fat foods, including meats.   Butter, margarine, hydrogenated oil (trans fats).   Peppermint or spearmint (if you have GERD).   Fruits and vegetables if not tolerated.   Alcohol.   Nicotine (smoking or chewing). This is one of the most potent stimulants to acid production in the gastrointestinal tract.   Any food that seems to aggravate your condition.  If you have questions regarding your diet, ask your caregiver or a registered dietitian. TIPS  Lying flat may make symptoms worse. Keep the head of your bed raised 6 to 9 inches (15 to 23 cm) by using a foam wedge or blocks under the legs of the bed.   Do not lay down until 3 hours after eating a meal.   Daily physical activity may help reduce symptoms.  MAKE SURE YOU:   Understand these instructions.   Will watch your condition.   Will get help right away if you are not doing well  or get worse.  Document Released: 05/14/2005 Document Revised: 01/24/2011 Document Reviewed: 09/27/2008 [facility] Patient Information 2012 [place], [place].

## 2011-07-26 NOTE — Op Note (Signed)
Teresa [name], THEYS                ACCOUNT NO.:  [id number]  MEDICAL RECORD NO.:  [id number]  LOCATION:  [facility]                          FACILITY:  [facility]  PHYSICIAN:  R. Roetta Sessions, MD FACP FACGDATE OF BIRTH:  [birth year]  DATE OF PROCEDURE:  07/26/2011 DATE OF DISCHARGE:                              OPERATIVE REPORT   EGD with [name] dilation followed by esophageal biopsy.  INDICATIONS FOR PROCEDURE:  A 26 year old lady with longstanding GERD, intermittent dysphagia.  She has failed multiple PPIs previously.  More recently, a course of Dexilant 60 mg orally daily has been associated with marked improvement in reflux symptoms and chest pain.  She continues to have intermittent dysphagia.  EGD with esophageal dilation etc. are appropriately now being done.  Risks, benefits, limitations, alternatives, imponderables have been reviewed.  Because of her difficulties with sedation previously, she is being done under deep sedation with the help of Dr. Jayme Cloud and associates.  PROCEDURE NOTE:  O2 saturation, blood pressure, pulse, and respirations were monitored throughout the entire procedure.  Deep sedation per Dr. Marcos Eke and associates.  INSTRUMENTATION:  Pentax video chip system, Cetacaine spray for topical pharyngeal anesthesia.  FINDINGS:  Examination of tubular esophagus revealed a patent tubular esophagus with somewhat of a patulous EG junction.  There were circumferential distal esophageal erosions within 5 mm of the Z-line. No Barrett esophagus.  No typical changes consistent with EOE.  The EG junction was easily traversed with the scope.  Stomach:  Gastric cavity was emptied, insufflated well with air. Thorough exam of the gastric mucosa including retroflexed proximal stomach, esophagogastric junction demonstrated only a 3-4 cm hiatal hernia.  Pylorus was patent, easily traversed.  Examination of bulb and second portion revealed no  abnormalities.  Therapeutic/diagnostic maneuvers performed:  The scope was withdrawn.  A 54-French Maloney dilator was passed to full insertion with ease.  A look back revealed no apparent complication related to this maneuver. Subsequently, biopsies of the distal and midesophagus were taken just to screen for EOE.  The patient tolerated the procedure well and was taken to PACU in stable condition.  IMPRESSION: 1. Distal esophageal erosions consistent with erosive reflux     esophagitis.  Patulous EG junction, status post passage of a     Maloney dilator (empirically), status post esophageal biopsy     followed dilation. 2. A 3-4 cm hiatal hernia, otherwise normal stomach, D1, D2.  RECOMMENDATIONS: 1. Continue Dexilant 60 mg orally daily. 2. Anti-reflux measures reviewed/literature provided. 3. Follow up on pathology. 4. Office visit with [place] in 5 months.     Jonathon Bellows, MD FACP [facility]     RMR/MEDQ  D:  07/26/2011  T:  07/26/2011  Job:  [id number]  cc:   Paulene Floor, NP Fax: [phone]

## 2011-07-26 NOTE — Anesthesia Preprocedure Evaluation (Signed)
Anesthesia Evaluation  Patient identified by MRN, date of birth, ID band Patient awake    Reviewed: Allergy & Precautions, H&P , NPO status , Patient's Chart, lab work & pertinent test results, reviewed documented beta blocker date and time   History of Anesthesia Complications Negative for: history of anesthetic complications  Airway Mallampati: II     Comment: Tongue piercing  Dental  (+) Teeth Intact   Pulmonary Current Smoker (am cough),  clear to auscultation        Cardiovascular + dysrhythmias (Palpitations - inactive) Regular Normal    Neuro/Psych    GI/Hepatic   Endo/Other  Diabetes mellitus- (gestational only)Hypothyroidism   Renal/GU      Musculoskeletal   Abdominal   Peds  Hematology   Anesthesia Other Findings   Reproductive/Obstetrics                           Anesthesia Physical Anesthesia Plan  ASA: II  Anesthesia Plan: MAC   Post-op Pain Management:    Induction: Intravenous  Airway Management Planned: Simple Face Mask  Additional Equipment:   Intra-op Plan:   Post-operative Plan:   Informed Consent: I have reviewed the patients History and Physical, chart, labs and discussed the procedure including the risks, benefits and alternatives for the proposed anesthesia with the patient or authorized representative who has indicated his/her understanding and acceptance.     Plan Discussed with:   Anesthesia Plan Comments:         Anesthesia Quick Evaluation

## 2011-07-26 NOTE — Anesthesia Postprocedure Evaluation (Signed)
  Anesthesia Post-op Note  Patient: Teresa [name]  Procedure(s) Performed: Procedure(s) (LRB): ESOPHAGOGASTRODUODENOSCOPY (EGD) WITH PROPOFOL (N/A) MALONEY DILATION (N/A)  Patient Location: PACU  Anesthesia Type: MAC  Level of Consciousness: awake  Airway and Oxygen Therapy: Patient Spontanous Breathing and Patient connected to face mask oxygen  Post-op Pain: none  Post-op Assessment: Post-op Vital signs reviewed, Patient's Cardiovascular Status Stable, Respiratory Function Stable, Patent Airway and No signs of Nausea or vomiting  Post-op Vital Signs: Reviewed and stable  Complications: No apparent anesthesia complications

## 2011-07-29 ENCOUNTER — Encounter: Payer: Self-pay | Admitting: Internal Medicine

## 2011-07-30 ENCOUNTER — Telehealth: Payer: Self-pay

## 2011-07-30 MED ORDER — PANTOPRAZOLE SODIUM 40 MG PO TBEC: 40.0000 mg | DELAYED_RELEASE_TABLET | Freq: Every day | ORAL | Status: DC

## 2011-07-30 NOTE — Telephone Encounter (Signed)
Pt called- she has been having abd pain and nausea. The pain is located just above her navel. Spoke with RMR he said to call in Carafate 1g qid x 5 days. Rx called to [facility] in [place]. RMR also advised pt to stick to liquids today, continue Dexilant and have ov in 2-3 months. Pt aware.  Pt stated she only had 6 days of dexilant samples left. Pt also has Medicaid- she has tried omeprazole, zegerid, aciphex and zantac. But she has not tried protonix. Medicaid will not cover Dexilant without trying protonix first. Informed pt that she would need to try it prior to doing PA and if she failed it she needed to call and let us know so we could do PA for Dexilant  Pt will need to try protonix can we send rx for protonix to [facility] in [place].

## 2011-07-31 ENCOUNTER — Encounter (HOSPITAL_COMMUNITY): Payer: Self-pay | Admitting: Internal Medicine

## 2011-08-01 ENCOUNTER — Encounter: Payer: Self-pay | Admitting: Internal Medicine

## 2011-08-02 NOTE — Progress Notes (Signed)
Letter dated 07/29/11 by Dr. Jena Gauss,  was done in error and has been deleted from pts chart.

## 2011-08-07 ENCOUNTER — Encounter: Payer: Self-pay | Admitting: Gastroenterology

## 2011-08-07 NOTE — Telephone Encounter (Signed)
OV scheduled for 06/12 @ 10:30 with Doristine Church- letter mailed

## 2011-08-08 ENCOUNTER — Other Ambulatory Visit: Payer: Self-pay | Admitting: Obstetrics & Gynecology

## 2011-08-08 ENCOUNTER — Encounter (HOSPITAL_COMMUNITY): Payer: Self-pay | Admitting: Pharmacy Technician

## 2011-08-09 ENCOUNTER — Other Ambulatory Visit (HOSPITAL_COMMUNITY): Payer: Medicaid Other

## 2011-08-10 ENCOUNTER — Encounter (HOSPITAL_COMMUNITY): Admission: RE | Admit: 2011-08-10 | Payer: Medicaid Other | Source: Ambulatory Visit

## 2011-08-10 NOTE — Patient Instructions (Addendum)
20 SHANNA UN  08/10/2011   Your procedure is scheduled on:  08/15/2011   Report to [facility] at  700  AM.  Call this number if you have problems the morning of surgery: [phone]   Remember:   Do not eat food:After Midnight.  May have clear liquids:until Midnight .  Clear liquids include soda, tea, black coffee, apple or grape juice, broth.  Take these medicines the morning of surgery with A SIP OF WATER:  Protonix,synthroid,percocet   Do not wear jewelry, make-up or nail polish.  Do not wear lotions, powders, or perfumes. You may wear deodorant.  Do not shave 48 hours prior to surgery.  Do not bring valuables to the hospital.  Contacts, dentures or bridgework may not be worn into surgery.  Leave suitcase in the car. After surgery it may be brought to your room.  For patients admitted to the hospital, checkout time is 11:00 AM the day of discharge.   Patients discharged the day of surgery will not be allowed to drive home.  Name and phone number of your driver: family  Special Instructions: CHG Shower Use Special Wash: 1/2 bottle night before surgery and 1/2 bottle morning of surgery.   Please read over the following fact sheets that you were given: Pain Booklet, MRSA Information, Surgical Site Infection Prevention, Anesthesia Post-op Instructions and Care and Recovery After Surgery Bilateral Salpingo-Oophorectomy Removal of both fallopian tubes and ovaries is called a Bilateral Salpingo-oophorectomy (BSO). The fallopian tubes transport the egg from the ovary to the womb (uterus). The fallopian tube is also where the sperm and egg meet and become fertilized and move down into the uterus. Usually when a BSO is done, the uterus was previously removed. Removing both tubes and ovaries will:  Put you into the menopause. You will no longer have menstrual periods.   May cause you to have symptoms of menopause (hot flashes, night sweats, mood changes).   Not affect your sex drive or  physical relationship.   Cause you to not be able to become pregnant (sterile).  LET YOUR CAREGIVER KNOW ABOUT:  Allergies to food or medication.   Medications taken including herbs, eye drops, over-the-counter medications, and creams.   Use of steroids (by mouth or creams).   Previous problems with anesthetics or numbing medication.   Possibility of pregnancy, if this applies.   Your smoking habits   History of blood clots (thrombophlebitis).   History of bleeding or blood problems.   Previous surgeries.   Other health problems.  RISKS AND COMPLICATIONS All surgery is associated with risks. Some of these risks are:  Injury to surrounding organs.   Bleeding.   Infection.   Blood clots in the legs or lungs.   Problems with the anesthesia.   The surgery does not help the problem.   Death.  BEFORE THE PROCEDURE  Do not take aspirin or blood thinners because it can make you bleed.   Do not eat or drink anything at least 8 hours before the surgery.   Let your caregiver know if you develop a cold or an infection.   If you are being admitted the day of surgery, arrive at least 1 hour before the surgery to read and sign the necessary forms and consents.   Arrange for help when you go home from the hospital.   If you smoke, do not smoke for at least 2 weeks before the surgery.  PROCEDURE  You will change into a hospital gown.  Then, you will be given an IV (intravenous) and a medication to relax you. You will be put to sleep with an anesthetic. Any hair on your lower belly (abdomen) will be removed, and a catheter will be placed in your bladder. The fallopian tubes and ovaries will be removed either through 2 very small cuts (incisions) or through large incision in the lower abdomen. AFTER THE PROCEDURE  You will be taken to the recovery room for 1 to 3 hours until your blood pressure, pulse, and temperature are stable and you are waking up.   If you had a  laparoscopy, you may be discharged in several hours.   If you had a laparoscopy, you may have shoulder pain for a day or two from air left in the abdomen. The air can irritate the nerve that goes from the diaphragm to the shoulder.   You will be given pain medication as is necessary.   The intravenous and catheter will be removed.   Have someone available to take you home from the hospital.  HOME CARE INSTRUCTIONS   Only take over-the-counter or prescription medicines for pain, discomfort, or fever as directed by your caregiver.   Do not take aspirin. It can cause bleeding.   Do not drive when taking pain medication.   Follow your caregiver's advice regarding diet, exercise, lifting, driving, and general activities.   You may resume your usual diet as directed and allowed.   Get plenty of rest and sleep.   Do not douche, use tampons, or have sexual intercourse until your caregiver says it is okay.   Change your bandages (dressings) as directed.   Take your temperature twice a day and write it down.   Your caregiver may recommend showers instead of baths for a few weeks.   Do not drink alcohol until your caregiver says it is okay.   If you develop constipation, you may take a mild laxative with your caregiver's permission. Bran foods and drinking fluids helps with constipation problems.   Try to have someone home with you for a week or two to help with the household activities.   Make sure you and your family understands everything about your operation and recovery.   Do not sign any legal documents until you feel normal again.   Keep all your follow-up appointments.  SEEK MEDICAL CARE IF:   There is swelling, redness, or increasing pain in the wound area.   Pus is coming from the wound.   You notice a bad smell from the wound or surgical dressing.   You have pain, redness, or swelling from the intravenous site.   The wound is breaking open (the edges are not  staying together).   You feel dizzy or feel like fainting.   You develop pain or bleeding when you urinate.   You develop diarrhea.   You develop nausea and vomiting.   You develop abnormal vaginal discharge.   You develop a rash.   You have any type of abnormal reaction or develop an allergy to your medication.   You need stronger pain medication for your pain.  SEEK IMMEDIATE MEDICAL CARE IF:   You develop an unexplained temperature above 100 F (37.8 C).   You develop abdominal pain.   You develop chest pain.   You develop shortness of breath.   You pass out.   You develop pain, swelling, or redness of your leg.   You develop heavy vaginal bleeding with or without blood clots.  Document Released: 05/14/2005 Document Revised: 05/03/2011 Document Reviewed: 10/08/2008 [facility] Patient Information 2012 [place], [place].Hysterectomy Information  A hysterectomy is a procedure where your uterus is surgically removed. It will no longer be possible to have menstrual periods or to become pregnant. The tubes and ovaries can be removed (bilateral salpingo-oopherectomy) during this surgery as well.  REASONS FOR A HYSTERECTOMY  Persistent, abnormal bleeding.   Lasting (chronic) pelvic pain or infection.   The lining of the uterus (endometrium) starts growing outside the uterus (endometriosis).   The endometrium starts growing in the muscle of the uterus (adenomyosis).   The uterus falls down into the vagina (pelvic organ prolapse).   Symptomatic uterine fibroids.   Precancerous cells.   Cervical cancer or uterine cancer.  TYPES OF HYSTERECTOMIES  Supracervical hysterectomy. This type removes the top part of the uterus, but not the cervix.   Total hysterectomy. This type removes the uterus and cervix.   Radical hysterectomy. This type removes the uterus, cervix, and the fibrous tissue that holds the uterus in place in the pelvis (parametrium).  WAYS A HYSTERECTOMY CAN  BE PERFORMED  Abdominal hysterectomy. A large surgical cut (incision) is made in the abdomen. The uterus is removed through this incision.   Vaginal hysterectomy. An incision is made in the vagina. The uterus is removed through this incision. There are no abdominal incisions.   Conventional laparoscopic hysterectomy. A thin, lighted tube with a camera (laparoscope) is inserted into 3 or 4 small incisions in the abdomen. The uterus is cut into small pieces. The small pieces are removed through the incisions, or they are removed through the vagina.   Laparoscopic assisted vaginal hysterectomy (LAVH). Three or four small incisions are made in the abdomen. Part of the surgery is performed laparoscopically and part vaginally. The uterus is removed through the vagina.   Robot-assisted laparoscopic hysterectomy. A laparoscope is inserted into 3 or 4 small incisions in the abdomen. A computer-controlled device is used to give the surgeon a 3D image. This allows for more precise movements of surgical instruments. The uterus is cut into small pieces and removed through the incisions or removed through the vagina.  RISKS OF HYSTERECTOMY   Bleeding and risk of blood transfusion. Tell your caregiver if you do not want to receive any blood products.   Blood clots in the legs or lung.   Infection.   Injury to surrounding organs.   Anesthesia problems or side effects.   Conversion to an abdominal hysterectomy.  WHAT TO EXPECT AFTER A HYSTERECTOMY  You will be given pain medicine.   You will need to have someone with you for the first 3 to 5 days after you go home.   You will need to follow up with your surgeon in 2 to 4 weeks after surgery to evaluate your progress.   You may have early menopause symptoms like hot flashes, night sweats, and insomnia.   If you had a hysterectomy for a problem that was not a cancer or a condition that could lead to cancer, then you no longer need Pap tests. However,  even if you no longer need a Pap test, a regular exam is a good idea to make sure no other problems are starting.  Document Released: 11/07/2000 Document Revised: 05/03/2011 Document Reviewed: 12/23/2010 [facility] Patient Information 2012 [place], [place].PATIENT INSTRUCTIONS POST-ANESTHESIA  IMMEDIATELY FOLLOWING SURGERY:  Do not drive or operate machinery for the first twenty four hours after surgery.  Do not make any important decisions for  twenty four hours after surgery or while taking narcotic pain medications or sedatives.  If you develop intractable nausea and vomiting or a severe headache please notify your doctor immediately.  FOLLOW-UP:  Please make an appointment with your surgeon as instructed. You do not need to follow up with anesthesia unless specifically instructed to do so.  WOUND CARE INSTRUCTIONS (if applicable):  Keep a dry clean dressing on the anesthesia/puncture wound site if there is drainage.  Once the wound has quit draining you may leave it open to air.  Generally you should leave the bandage intact for twenty four hours unless there is drainage.  If the epidural site drains for more than 36-48 hours please call the anesthesia department.  QUESTIONS?:  Please feel free to call your physician or the hospital operator if you have any questions, and they will be happy to assist you.     [facility] Anesthesia Department [address] [place] [phone]

## 2011-08-13 ENCOUNTER — Encounter (HOSPITAL_COMMUNITY)
Admission: RE | Admit: 2011-08-13 | Discharge: 2011-08-13 | Disposition: A | Discharge status: Home / Self Care | Payer: Medicaid Other | Source: Ambulatory Visit | Attending: Obstetrics & Gynecology | Admitting: Obstetrics & Gynecology

## 2011-08-13 ENCOUNTER — Encounter (HOSPITAL_COMMUNITY): Payer: Self-pay

## 2011-08-13 LAB — URINALYSIS, ROUTINE W REFLEX MICROSCOPIC
Glucose, UA: NEGATIVE mg/dL
Ketones, ur: NEGATIVE mg/dL
Leukocytes, UA: NEGATIVE
pH: 6.5 (ref 5.0–8.0)

## 2011-08-13 LAB — HCG, QUANTITATIVE, PREGNANCY: hCG, Beta Chain, Quant, S: <1 m[IU]/mL (ref <5)

## 2011-08-13 LAB — CBC
HCT: 38.7 % (ref 36.0–46.0)
Hemoglobin: 12.6 g/dL (ref 12.0–15.0)
RBC: 4.41 MIL/uL (ref 3.87–5.11)
WBC: 7.7 10*3/uL (ref 4.0–10.5)

## 2011-08-13 LAB — COMPREHENSIVE METABOLIC PANEL
Albumin: 3.8 g/dL (ref 3.5–5.2)
Alkaline Phosphatase: 84 U/L (ref 39–117)
BUN: 11 mg/dL (ref 6–23)
CO2: 26 mEq/L (ref 19–32)
Chloride: 103 mEq/L (ref 96–112)
GFR calc Af Amer: >90 mL/min (ref >90)
Glucose, Bld: 85 mg/dL (ref 70–99)
Potassium: 4.3 mEq/L (ref 3.5–5.1)
Total Bilirubin: 0.1 mg/dL — ABNORMAL LOW (ref 0.3–1.2)

## 2011-08-13 LAB — SURGICAL PCR SCREEN: Staphylococcus aureus: NEGATIVE

## 2011-08-15 ENCOUNTER — Ambulatory Visit (HOSPITAL_COMMUNITY): Payer: Medicaid Other | Admitting: Anesthesiology

## 2011-08-15 ENCOUNTER — Inpatient Hospital Stay (HOSPITAL_COMMUNITY)
Admission: RE | Admit: 2011-08-15 | Discharge: 2011-08-16 | DRG: 743 | Disposition: A | Discharge status: Home / Self Care | Payer: Medicaid Other | Source: Ambulatory Visit | Attending: Obstetrics & Gynecology | Admitting: Obstetrics & Gynecology

## 2011-08-15 ENCOUNTER — Encounter (HOSPITAL_COMMUNITY): Payer: Self-pay | Admitting: Anesthesiology

## 2011-08-15 ENCOUNTER — Encounter (HOSPITAL_COMMUNITY): Payer: Self-pay

## 2011-08-15 ENCOUNTER — Encounter (HOSPITAL_COMMUNITY)
Admission: RE | Disposition: A | Discharge status: Home / Self Care | Payer: Self-pay | Source: Ambulatory Visit | Attending: Obstetrics & Gynecology

## 2011-08-15 DIAGNOSIS — IMO0002 Reserved for concepts with insufficient information to code with codable children: Secondary | ICD-10-CM | POA: Diagnosis present

## 2011-08-15 DIAGNOSIS — E039 Hypothyroidism, unspecified: Secondary | ICD-10-CM | POA: Diagnosis present

## 2011-08-15 DIAGNOSIS — I499 Cardiac arrhythmia, unspecified: Secondary | ICD-10-CM | POA: Diagnosis present

## 2011-08-15 DIAGNOSIS — Z01812 Encounter for preprocedural laboratory examination: Secondary | ICD-10-CM

## 2011-08-15 DIAGNOSIS — F17200 Nicotine dependence, unspecified, uncomplicated: Secondary | ICD-10-CM | POA: Diagnosis present

## 2011-08-15 DIAGNOSIS — N949 Unspecified condition associated with female genital organs and menstrual cycle: Principal | ICD-10-CM | POA: Diagnosis present

## 2011-08-15 DIAGNOSIS — Z90710 Acquired absence of both cervix and uterus: Secondary | ICD-10-CM

## 2011-08-15 DIAGNOSIS — N920 Excessive and frequent menstruation with regular cycle: Secondary | ICD-10-CM | POA: Diagnosis present

## 2011-08-15 HISTORY — PX: SALPINGOOPHORECTOMY: SHX82

## 2011-08-15 HISTORY — PX: ABDOMINAL HYSTERECTOMY: SHX81

## 2011-08-15 SURGERY — HYSTERECTOMY, ABDOMINAL
Anesthesia: General | Site: Abdomen | Wound class: Clean Contaminated

## 2011-08-15 MED ORDER — GLYCOPYRROLATE 0.2 MG/ML IJ SOLN
INTRAMUSCULAR | Status: AC
  Filled 2011-08-15: qty 1

## 2011-08-15 MED ORDER — ALUM & MAG HYDROXIDE-SIMETH 200-200-20 MG/5ML PO SUSP
30.0000 mL | ORAL | Status: DC | PRN
  Administered 2011-08-16: 30 mL via ORAL
  Filled 2011-08-15: qty 30

## 2011-08-15 MED ORDER — FENTANYL CITRATE 0.05 MG/ML IJ SOLN
INTRAMUSCULAR | Status: AC
  Administered 2011-08-15: 50 ug via INTRAVENOUS
  Filled 2011-08-15: qty 5

## 2011-08-15 MED ORDER — SODIUM CHLORIDE 0.9 % IR SOLN
Status: DC | PRN
  Administered 2011-08-15: 2000 mL

## 2011-08-15 MED ORDER — ROCURONIUM BROMIDE 50 MG/5ML IV SOLN
INTRAVENOUS | Status: AC
  Filled 2011-08-15: qty 1

## 2011-08-15 MED ORDER — MIDAZOLAM HCL 2 MG/2ML IJ SOLN
1.0000 mg | INTRAMUSCULAR | Status: DC | PRN
  Administered 2011-08-15: 2 mg via INTRAVENOUS

## 2011-08-15 MED ORDER — MIDAZOLAM HCL 2 MG/2ML IJ SOLN
INTRAMUSCULAR | Status: AC
  Administered 2011-08-15: 2 mg via INTRAVENOUS
  Filled 2011-08-15: qty 2

## 2011-08-15 MED ORDER — FENTANYL CITRATE 0.05 MG/ML IJ SOLN
25.0000 ug | INTRAMUSCULAR | Status: DC | PRN
  Administered 2011-08-15: 100 ug via INTRAVENOUS
  Administered 2011-08-15 (×3): 50 ug via INTRAVENOUS

## 2011-08-15 MED ORDER — FENTANYL CITRATE 0.05 MG/ML IJ SOLN
INTRAMUSCULAR | Status: AC
  Administered 2011-08-15: 50 ug via INTRAVENOUS
  Filled 2011-08-15: qty 2

## 2011-08-15 MED ORDER — SENNA 8.6 MG PO TABS
1.0000 | ORAL_TABLET | Freq: Two times a day (BID) | ORAL | Status: DC
  Administered 2011-08-15 – 2011-08-16 (×3): 8.6 mg via ORAL
  Filled 2011-08-15 (×3): qty 1

## 2011-08-15 MED ORDER — GLYCOPYRROLATE 0.2 MG/ML IJ SOLN
0.2000 mg | Freq: Once | INTRAMUSCULAR | Status: AC
  Administered 2011-08-15: 0.2 mg via INTRAVENOUS

## 2011-08-15 MED ORDER — LEVOTHYROXINE SODIUM 75 MCG PO TABS
75.0000 ug | ORAL_TABLET | Freq: Every day | ORAL | Status: DC
  Administered 2011-08-15 – 2011-08-16 (×2): 75 ug via ORAL
  Filled 2011-08-15 (×2): qty 1

## 2011-08-15 MED ORDER — CIPROFLOXACIN IN D5W 400 MG/200ML IV SOLN: 400.0000 mg | INTRAVENOUS | Status: DC

## 2011-08-15 MED ORDER — PROPOFOL 10 MG/ML IV EMUL
INTRAVENOUS | Status: AC
  Filled 2011-08-15: qty 20

## 2011-08-15 MED ORDER — FENTANYL CITRATE 0.05 MG/ML IJ SOLN
INTRAMUSCULAR | Status: AC
  Administered 2011-08-15: 100 ug via INTRAVENOUS
  Filled 2011-08-15: qty 2

## 2011-08-15 MED ORDER — CIPROFLOXACIN IN D5W 400 MG/200ML IV SOLN
INTRAVENOUS | Status: AC
  Administered 2011-08-15: 400 mg via INTRAVENOUS
  Filled 2011-08-15: qty 200

## 2011-08-15 MED ORDER — KETOROLAC TROMETHAMINE 30 MG/ML IJ SOLN
30.0000 mg | Freq: Once | INTRAMUSCULAR | Status: AC
  Administered 2011-08-15: 30 mg via INTRAVENOUS

## 2011-08-15 MED ORDER — LACTATED RINGERS IV SOLN: INTRAVENOUS | Status: DC

## 2011-08-15 MED ORDER — LIDOCAINE HCL (CARDIAC) 10 MG/ML IV SOLN
INTRAVENOUS | Status: DC | PRN
  Administered 2011-08-15: 10 mg via INTRAVENOUS

## 2011-08-15 MED ORDER — PROMETHAZINE HCL 25 MG/ML IJ SOLN
12.5000 mg | Freq: Once | INTRAMUSCULAR | Status: AC
  Administered 2011-08-15: 12.5 mg via INTRAVENOUS

## 2011-08-15 MED ORDER — ONDANSETRON HCL 4 MG/2ML IJ SOLN
INTRAMUSCULAR | Status: AC
  Administered 2011-08-15: 4 mg via INTRAVENOUS
  Filled 2011-08-15: qty 2

## 2011-08-15 MED ORDER — HYDROMORPHONE HCL PF 1 MG/ML IJ SOLN
0.5000 mg | INTRAMUSCULAR | Status: DC | PRN
  Administered 2011-08-15 (×2): 0.5 mg via INTRAVENOUS

## 2011-08-15 MED ORDER — HYDROMORPHONE HCL PF 1 MG/ML IJ SOLN
1.0000 mg | INTRAMUSCULAR | Status: DC | PRN
  Administered 2011-08-15 (×2): 2 mg via INTRAVENOUS
  Administered 2011-08-15 (×2): 1 mg via INTRAVENOUS
  Administered 2011-08-16 (×5): 2 mg via INTRAVENOUS
  Filled 2011-08-15: qty 2
  Filled 2011-08-15: qty 1
  Filled 2011-08-15 (×6): qty 2
  Filled 2011-08-15: qty 1

## 2011-08-15 MED ORDER — ZOLPIDEM TARTRATE 5 MG PO TABS: 5.0000 mg | ORAL_TABLET | Freq: Every evening | ORAL | Status: DC | PRN

## 2011-08-15 MED ORDER — PANTOPRAZOLE SODIUM 40 MG PO TBEC
40.0000 mg | DELAYED_RELEASE_TABLET | Freq: Every day | ORAL | Status: DC
  Administered 2011-08-15 – 2011-08-16 (×2): 40 mg via ORAL
  Filled 2011-08-15 (×2): qty 1

## 2011-08-15 MED ORDER — KCL IN DEXTROSE-NACL 20-5-0.45 MEQ/L-%-% IV SOLN
INTRAVENOUS | Status: DC
  Administered 2011-08-15 – 2011-08-16 (×3): via INTRAVENOUS

## 2011-08-15 MED ORDER — FENTANYL CITRATE 0.05 MG/ML IJ SOLN
INTRAMUSCULAR | Status: DC | PRN
  Administered 2011-08-15: 100 ug via INTRAVENOUS
  Administered 2011-08-15 (×7): 50 ug via INTRAVENOUS

## 2011-08-15 MED ORDER — ROCURONIUM BROMIDE 100 MG/10ML IV SOLN
INTRAVENOUS | Status: DC | PRN
  Administered 2011-08-15: 10 mg via INTRAVENOUS
  Administered 2011-08-15: 50 mg via INTRAVENOUS

## 2011-08-15 MED ORDER — ONDANSETRON HCL 4 MG PO TABS
8.0000 mg | ORAL_TABLET | Freq: Four times a day (QID) | ORAL | Status: DC | PRN
  Administered 2011-08-15: 8 mg via ORAL
  Filled 2011-08-15: qty 2

## 2011-08-15 MED ORDER — PROPOFOL 10 MG/ML IV EMUL
INTRAVENOUS | Status: DC | PRN
  Administered 2011-08-15: 20 mg via INTRAVENOUS
  Administered 2011-08-15: 180 mg via INTRAVENOUS

## 2011-08-15 MED ORDER — GLYCOPYRROLATE 0.2 MG/ML IJ SOLN
INTRAMUSCULAR | Status: DC | PRN
  Administered 2011-08-15: 0.4 mg via INTRAVENOUS
  Administered 2011-08-15: 0.2 mg via INTRAVENOUS

## 2011-08-15 MED ORDER — NEOSTIGMINE METHYLSULFATE 1 MG/ML IJ SOLN
INTRAMUSCULAR | Status: AC
  Filled 2011-08-15: qty 10

## 2011-08-15 MED ORDER — CLINDAMYCIN PHOSPHATE 900 MG/50ML IV SOLN: 900.0000 mg | INTRAVENOUS | Status: DC

## 2011-08-15 MED ORDER — LIDOCAINE HCL (PF) 1 % IJ SOLN
INTRAMUSCULAR | Status: AC
  Filled 2011-08-15: qty 5

## 2011-08-15 MED ORDER — ONDANSETRON 8 MG/NS 50 ML IVPB
8.0000 mg | Freq: Four times a day (QID) | INTRAVENOUS | Status: DC | PRN
  Filled 2011-08-15: qty 8

## 2011-08-15 MED ORDER — LACTATED RINGERS IV SOLN
INTRAVENOUS | Status: DC
  Administered 2011-08-15 (×2): 1000 mL via INTRAVENOUS

## 2011-08-15 MED ORDER — ONDANSETRON HCL 4 MG/2ML IJ SOLN: 4.0000 mg | Freq: Once | INTRAMUSCULAR | Status: DC | PRN

## 2011-08-15 MED ORDER — OXYCODONE-ACETAMINOPHEN 5-325 MG PO TABS
1.0000 | ORAL_TABLET | ORAL | Status: DC | PRN
  Administered 2011-08-16: 2 via ORAL
  Filled 2011-08-15: qty 2

## 2011-08-15 MED ORDER — PROMETHAZINE HCL 25 MG/ML IJ SOLN
INTRAMUSCULAR | Status: AC
  Administered 2011-08-15: 12.5 mg via INTRAVENOUS
  Filled 2011-08-15: qty 1

## 2011-08-15 MED ORDER — NEOSTIGMINE METHYLSULFATE 1 MG/ML IJ SOLN
INTRAMUSCULAR | Status: DC | PRN
  Administered 2011-08-15: 2 mg via INTRAVENOUS
  Administered 2011-08-15: 1 mg via INTRAVENOUS

## 2011-08-15 MED ORDER — CLINDAMYCIN PHOSPHATE 900 MG/50ML IV SOLN
INTRAVENOUS | Status: AC
  Administered 2011-08-15: 900 mg via INTRAVENOUS
  Filled 2011-08-15: qty 50

## 2011-08-15 MED ORDER — KETOROLAC TROMETHAMINE 30 MG/ML IJ SOLN
INTRAMUSCULAR | Status: AC
  Administered 2011-08-15: 30 mg via INTRAVENOUS
  Filled 2011-08-15: qty 1

## 2011-08-15 MED ORDER — HYDROMORPHONE HCL PF 2 MG/ML IJ SOLN
INTRAMUSCULAR | Status: AC
  Filled 2011-08-15: qty 1

## 2011-08-15 MED ORDER — ONDANSETRON HCL 4 MG/2ML IJ SOLN
4.0000 mg | Freq: Once | INTRAMUSCULAR | Status: AC
  Administered 2011-08-15: 4 mg via INTRAVENOUS

## 2011-08-15 SURGICAL SUPPLY — 50 items
APPLIER CLIP 13 LRG OPEN (CLIP)
BAG HAMPER (MISCELLANEOUS) ×3 IMPLANT
BLADE SURG ROTATE 9660 (MISCELLANEOUS) ×3 IMPLANT
CELLS DAT CNTRL 66122 CELL SVR (MISCELLANEOUS) ×2 IMPLANT
CLIP APPLIE 13 LRG OPEN (CLIP) IMPLANT
CLOTH BEACON ORANGE TIMEOUT ST (SAFETY) ×3 IMPLANT
COVER SURGICAL LIGHT HANDLE (MISCELLANEOUS) ×6 IMPLANT
DERMABOND ADVANCED (GAUZE/BANDAGES/DRESSINGS) ×3
DERMABOND ADVANCED .7 DNX12 (GAUZE/BANDAGES/DRESSINGS) ×6 IMPLANT
DRAPE WARM FLUID 44X44 (DRAPE) ×3 IMPLANT
DRESSING TELFA 8X3 (GAUZE/BANDAGES/DRESSINGS) ×3 IMPLANT
ELECT REM PT RETURN 9FT ADLT (ELECTROSURGICAL) ×3
ELECTRODE REM PT RTRN 9FT ADLT (ELECTROSURGICAL) ×2 IMPLANT
FORMALIN 10 PREFIL 480ML (MISCELLANEOUS) ×3 IMPLANT
GLOVE BIOGEL PI IND STRL 8 (GLOVE) ×2 IMPLANT
GLOVE BIOGEL PI INDICATOR 8 (GLOVE) ×1
GLOVE ECLIPSE 8.0 STRL XLNG CF (GLOVE) ×3 IMPLANT
GLOVE INDICATOR 7.5 STRL GRN (GLOVE) ×6 IMPLANT
GLOVE SKINSENSE NS SZ7.0 (GLOVE) ×2
GLOVE SKINSENSE NS SZ8.0 LF (GLOVE) ×2
GLOVE SKINSENSE STRL SZ7.0 (GLOVE) ×4 IMPLANT
GLOVE SKINSENSE STRL SZ8.0 LF (GLOVE) ×4 IMPLANT
GOWN SRG XL XLNG 56XLVL 4 (GOWN DISPOSABLE) ×2 IMPLANT
GOWN STRL NON-REIN XL XLG LVL4 (GOWN DISPOSABLE) ×1
GOWN STRL REIN XL XLG (GOWN DISPOSABLE) ×6 IMPLANT
INST SET MAJOR GENERAL (KITS) ×3 IMPLANT
KIT ROOM TURNOVER APOR (KITS) ×3 IMPLANT
MANIFOLD NEPTUNE II (INSTRUMENTS) ×3 IMPLANT
NS IRRIG 1000ML POUR BTL (IV SOLUTION) ×9 IMPLANT
PACK ABDOMINAL MAJOR (CUSTOM PROCEDURE TRAY) ×3 IMPLANT
PAD ARMBOARD 7.5X6 YLW CONV (MISCELLANEOUS) ×3 IMPLANT
RETRACTOR WND ALEXIS 25 LRG (MISCELLANEOUS) IMPLANT
RTRCTR WOUND ALEXIS 18CM MED (MISCELLANEOUS) ×3
RTRCTR WOUND ALEXIS 25CM LRG (MISCELLANEOUS)
SEPRAFILM MEMBRANE 5X6 (MISCELLANEOUS) IMPLANT
SET BASIN LINEN APH (SET/KITS/TRAYS/PACK) ×3 IMPLANT
SPONGE LAP 18X18 X RAY DECT (DISPOSABLE) ×6 IMPLANT
STAPLER VISISTAT 35W (STAPLE) ×3 IMPLANT
SUT CHROMIC 0 CT 1 (SUTURE) ×3 IMPLANT
SUT MON AB 3-0 SH 27 (SUTURE) ×6 IMPLANT
SUT PLAIN 2 0 XLH (SUTURE) IMPLANT
SUT VIC AB 0 CT1 27 (SUTURE) ×4
SUT VIC AB 0 CT1 27XBRD ANTBC (SUTURE) ×2 IMPLANT
SUT VIC AB 0 CT1 27XCR 8 STRN (SUTURE) ×6 IMPLANT
SUT VIC AB 0 CTX 36 (SUTURE) ×1
SUT VIC AB 0 CTX36XBRD ANTBCTR (SUTURE) ×2 IMPLANT
SUT VICRYL 3 0 (SUTURE) ×3 IMPLANT
TOWEL BLUE STERILE X RAY DET (MISCELLANEOUS) ×3 IMPLANT
TRAY FOLEY BAG SILVER LF 14FR (CATHETERS) ×3 IMPLANT
TRAY FOLEY CATH 14FR (SET/KITS/TRAYS/PACK) IMPLANT

## 2011-08-15 NOTE — Anesthesia Postprocedure Evaluation (Deleted)
  Anesthesia Post-op Note  Patient: Teresa [name]  Procedure(s) Performed: Procedure(s) (LRB): HYSTERECTOMY ABDOMINAL (N/A) SALPINGO OOPHERECTOMY (Bilateral)          Anesthesia Post Note  Patient: Teresa [name]  Procedure(s) Performed: Procedure(s) (LRB): HYSTERECTOMY ABDOMINAL (N/A) SALPINGO OOPHERECTOMY (Bilateral)  Anesthesia type: General  Patient location: PACU  Post pain: Pain level controlled  Post assessment: Post-op Vital signs reviewed, Patient's Cardiovascular Status Stable, Respiratory Function Stable, Patent Airway, No signs of Nausea or vomiting and Pain level controlled  Last Vitals:  Filed Vitals:   08/15/11 1050  BP: 118/74  Pulse: 89  Temp: 36.5 C  Resp: 20    Post vital signs: Reviewed and stable  Level of consciousness: awake and alert   Complications: No apparent anesthesia complications

## 2011-08-15 NOTE — Progress Notes (Signed)
MD notified of pts c/o of foley catheter hurting. Orders to remove catheter given. Removed at this time. Pt resting comfortably at this time.  Will monitor.

## 2011-08-15 NOTE — Anesthesia Preprocedure Evaluation (Signed)
Anesthesia Evaluation  Patient identified by MRN, date of birth, ID band Patient awake    Reviewed: Allergy & Precautions, H&P , NPO status , Patient's Chart, lab work & pertinent test results  Airway Mallampati: II  Neck ROM: Full    Dental  (+) Teeth Intact   Pulmonary asthma , Current Smoker,  breath sounds clear to auscultation        Cardiovascular + dysrhythmias (PNC's) Rhythm:Regular Rate:Normal     Neuro/Psych    GI/Hepatic hiatal hernia, GERD-  Medicated and Controlled,  Endo/Other  Hypothyroidism   Renal/GU      Musculoskeletal   Abdominal   Peds  Hematology   Anesthesia Other Findings   Reproductive/Obstetrics                           Anesthesia Physical Anesthesia Plan  ASA: II  Anesthesia Plan: General   Post-op Pain Management:    Induction: Intravenous, Rapid sequence and Cricoid pressure planned  Airway Management Planned: Oral ETT  Additional Equipment:   Intra-op Plan:   Post-operative Plan: Extubation in OR  Informed Consent: I have reviewed the patients History and Physical, chart, labs and discussed the procedure including the risks, benefits and alternatives for the proposed anesthesia with the patient or authorized representative who has indicated his/her understanding and acceptance.     Plan Discussed with:   Anesthesia Plan Comments:         Anesthesia Quick Evaluation

## 2011-08-15 NOTE — H&P (Signed)
Teresa [name] is an 26 y.o. female gravida 2 para 2 status post a tubal ligation at the time of her repeat serous cesarean section which was performed in May of 2011.  The patient always has had chronic pelvic pain issues.  Between her 2 pregnancies and 2007 in 2011 she had repeated visits for pelvic pain with workup including ultrasound and cultures all of which returned as negative.  She was empirically treated with antibiotics continuous birth control pills nonsteroidal and anti- inflammatory drugs, all without success.  After she delivered in May of 2011 she had a relatively quiet. In the immediate postoperative period but then began to have the worse pelvic pain she is ever experience.  Additionally her bleeding became prolonged irregular and heavy and associated with an increased amount of pain is well.  Over this past 8 months to a year we performed 2 ultrasounds had her on antibiotics, use continuous birth control pills nonsteroidal inflammatory drugs and low-grade narcotics all without success.  The patient has severe dyspareunia right lower quadrant pain left lower quadrant pain.  The ultrasound shows a small functional cyst on the left ovary which should be clinically insignificant at 3-1/2 cm her right ovary appears to be adherent to the fundus of the uterus.  She has cervical motion tenderness with even the least amount of movement.  Again all her cultures have been negative she was treated. We with antibiotics all without success.  We discussed the clinical situation at length with the patient and her family.  They understand that we had used essentially all benign surgical modalities that we have including chronic high-dose Megace therapy.  None of which has been successful.  They also understand that surgical management would include a TAH and BSO since her pain is midline and bilateral which would render the patient surgically menopausal.  Patient understands this and desires to proceed with this  definitive management understanding she'll have to be in postoperative menopausal estrogen therapy over the next 25 years.  The patient understands there may be a difficult transition with her hormone replacement therapy.       Past Medical History  Diagnosis Date  . Asthma   . GERD (gastroesophageal reflux disease)   . Hiatal hernia   . Tobacco abuse   . PVC (premature ventricular contraction)   . Hypothyroidism     Past Surgical History  Procedure Date  . Cholecystectomy 2007  . Cesarean section 2007 and 2011  . Esophagogastroduodenoscopy 2008    incomplete due to inadequate conscious sedation, circumferential distal esophageal erosions, hiatal hernia  . Tubal ligation 2011  . Maloney dilation 07/26/2011    Procedure: Teresa [name] DILATION;  Surgeon: Corbin Ade, MD;  Location: [facility];  Service: Endoscopy;  Laterality: N/A;  52 [place]  . Esophageal biopsy 07/26/2011    Procedure: BIOPSY;  Surgeon: Corbin Ade, MD;  Location: [facility];  Service: Endoscopy;;  Esophageal biopsies    Family History  Problem Relation Age of Onset  . Ulcers Mother     Questionable history  . Liver disease Neg Hx   . Cancer Neg Hx     Colorectal cancer  . Anesthesia problems Neg Hx   . Hypotension Neg Hx   . Malignant hyperthermia Neg Hx   . Pseudochol deficiency Neg Hx     Social History:  reports that she has been smoking Cigarettes.  She has a 1.2 pack-year smoking history. She does not have any smokeless tobacco history on  file. She reports that she does not drink alcohol or use illicit drugs.  Allergies:  Allergies  Allergen Reactions  . Ceclor (Cefaclor) Hives  . Adhesive (Tape) Rash  . Latex Rash    Prescriptions prior to admission  Medication Sig Dispense Refill  . levothyroxine (SYNTHROID, LEVOTHROID) 75 MCG tablet Take 75 mcg by mouth daily.      Marland Kitchen oxyCODONE-acetaminophen (PERCOCET) 5-325 MG per tablet Take 1 tablet by mouth every 6 (six) hours as needed. For pain        . pantoprazole (PROTONIX) 40 MG tablet Take 1 tablet (40 mg total) by mouth daily.  31 tablet  2    ROS  Review of Systems  Constitutional: Negative for fever, chills, weight loss, malaise/fatigue and diaphoresis.  HENT: Negative for hearing loss, ear pain, nosebleeds, congestion, sore throat, neck pain, tinnitus and ear discharge.   Eyes: Negative for blurred vision, double vision, photophobia, pain, discharge and redness.  Respiratory: Negative for cough, hemoptysis, sputum production, shortness of breath, wheezing and stridor.   Cardiovascular: Negative for chest pain, palpitations, orthopnea, claudication, leg swelling and PND.  Gastrointestinal: Positive for abdominal pain, right and left lower quadrants, as well as, midline. Negative for heartburn, nausea, vomiting, diarrhea, constipation, blood in stool and melena.  Genitourinary: Negative for dysuria, urgency, frequency, hematuria and flank pain.  Positive for cervical motion tenderness, tenderness with palpation of the uterus and both adnexa, actually causes patient to cry with exam on multiple occasions. Musculoskeletal: Negative for myalgias, back pain, joint pain and falls.  Skin: Negative for itching and rash.  Neurological: Negative for dizziness, tingling, tremors, sensory change, speech change, focal weakness, seizures, loss of consciousness, weakness and headaches.  Endo/Heme/Allergies: Negative for environmental allergies and polydipsia. Does not bruise/bleed easily.  Psychiatric/Behavioral: Negative for depression, suicidal ideas, hallucinations, memory loss and substance abuse. The patient is not nervous/anxious and does not have insomnia.      Temperature 98.3 F (36.8 C), temperature source Oral. Physical Exam Physical Exam  Vitals reviewed. Constitutional: She is oriented to person, place, and time. She appears well-developed and well-nourished.  HENT:  Head: Normocephalic and atraumatic.  Right Ear: External ear  normal.  Left Ear: External ear normal.  Nose: Nose normal.  Mouth/Throat: Oropharynx is clear and moist.  Eyes: Conjunctivae and EOM are normal. Pupils are equal, round, and reactive to light. Right eye exhibits no discharge. Left eye exhibits no discharge. No scleral icterus.  Neck: Normal range of motion. Neck supple. No tracheal deviation present. No thyromegaly present.  Cardiovascular: Normal rate, regular rhythm, normal heart sounds and intact distal pulses.  Exam reveals no gallop and no friction rub.   No murmur heard. Respiratory: Effort normal and breath sounds normal. No respiratory distress. She has no wheezes. She has no rales. She exhibits no tenderness.  GI: Soft. Bowel sounds are normal. She exhibits no distension and no mass. There is tenderness. There is no rebound and no guarding.  Genitourinary:       Vulva is normal without lesions Vagina is pink moist without discharge Cervix normal in appearance and pap is normal.  Positive for cervical motion tenderness, tenderness with palpation of the uterus and both adnexa, actually causes patient to cry with exam on multiple occasions. Uterus is normal size but exquisitely tender to palpation and movement Adnexa is also very tender with normal sized ovaries by sonogram, small 3.6 cm functional cyst on left with right ovary appearing to be adherent to the right fundus  Musculoskeletal: Normal range of motion. She exhibits no edema and no tenderness.  Neurological: She is alert and oriented to person, place, and time. She has normal reflexes. She displays normal reflexes. No cranial nerve deficit. She exhibits normal muscle tone. Coordination normal.  Skin: Skin is warm and dry. No rash noted. No erythema. No pallor.  Psychiatric: She has a normal mood and affect. Her behavior is normal. Judgment and thought content normal.   Recent Results (from the past 336 hour(s))  SURGICAL PCR SCREEN   Collection Time   08/13/11 12:54 PM       Component Value Range   MRSA, PCR NEGATIVE  NEGATIVE    Staphylococcus aureus NEGATIVE  NEGATIVE   URINALYSIS, ROUTINE W REFLEX MICROSCOPIC   Collection Time   08/13/11 12:54 PM      Component Value Range   Color, Urine YELLOW  YELLOW    APPearance CLEAR  CLEAR    Specific Gravity, Urine <1.005 (*) 1.005 - 1.030    pH 6.5  5.0 - 8.0    Glucose, UA NEGATIVE  NEGATIVE (mg/dL)   Hgb urine dipstick NEGATIVE  NEGATIVE    Bilirubin Urine NEGATIVE  NEGATIVE    Ketones, ur NEGATIVE  NEGATIVE (mg/dL)   Protein, ur NEGATIVE  NEGATIVE (mg/dL)   Urobilinogen, UA 0.2  0.0 - 1.0 (mg/dL)   Nitrite NEGATIVE  NEGATIVE    Leukocytes, UA NEGATIVE  NEGATIVE   CBC   Collection Time   08/13/11  1:10 PM      Component Value Range   WBC 7.7  4.0 - 10.5 (K/uL)   RBC 4.41  3.87 - 5.11 (MIL/uL)   Hemoglobin 12.6  12.0 - 15.0 (g/dL)   HCT 16.1  09.6 - 04.5 (%)   MCV 87.8  78.0 - 100.0 (fL)   MCH 28.6  26.0 - 34.0 (pg)   MCHC 32.6  30.0 - 36.0 (g/dL)   RDW 40.9  81.1 - 91.4 (%)   Platelets 256  150 - 400 (K/uL)  COMPREHENSIVE METABOLIC PANEL   Collection Time   08/13/11  1:10 PM      Component Value Range   Sodium 137  135 - 145 (mEq/L)   Potassium 4.3  3.5 - 5.1 (mEq/L)   Chloride 103  96 - 112 (mEq/L)   CO2 26  19 - 32 (mEq/L)   Glucose, Bld 85  70 - 99 (mg/dL)   BUN 11  6 - 23 (mg/dL)   Creatinine, Ser 7.82  0.50 - 1.10 (mg/dL)   Calcium 9.2  8.4 - 95.6 (mg/dL)   Total Protein 7.1  6.0 - 8.3 (g/dL)   Albumin 3.8  3.5 - 5.2 (g/dL)   AST 12  0 - 37 (U/L)   ALT 9  0 - 35 (U/L)   Alkaline Phosphatase 84  39 - 117 (U/L)   Total Bilirubin 0.1 (*) 0.3 - 1.2 (mg/dL)   GFR calc non Af Amer >90  >90 (mL/min)   GFR calc Af Amer >90  >90 (mL/min)  HCG, QUANTITATIVE, PREGNANCY   Collection Time   08/13/11  1:10 PM      Component Value Range   hCG, Beta Chain, Quant, S <1  <5 (mIU/mL)  TYPE AND SCREEN   Collection Time   08/13/11  1:10 PM      Component Value Range   ABO/RH(D) O POS     Antibody  Screen NEG     Sample Expiration 08/27/2011  Assessment/Plan: 1.  chronic pelvic pain  2.  dyspareunia  3.  bilateral adnexal pain  4.  menometrorrhagia and dysmenorrhea   Patient understands the complexity of her pelvic pain and that we don't have a definitive reason.  I doubt endometriosis.  However despite all of our treatment modalities she has not responded to any of the and her pain has been getting progressively worse. She understands the difficulty of managing postoperative menopause and that it may take some time to be able to regulate her symptoms.  Pt understands the risks of surgery including but not limited t  excessive bleeding requiring transfusion or reoperation, post-operative infection requiring prolonged hospitalization or re-hospitalization and antibiotic therapy, and damage to other organs including bladder, bowel, ureters and major vessels.  The patient also understands the alternative treatment options which were discussed in full.  All questions were answered.   Myrta Mercer H 08/15/2011, 7:44 AM

## 2011-08-15 NOTE — Transfer of Care (Signed)
Immediate Anesthesia Transfer of Care Note  Patient: Teresa [name]  Procedure(s) Performed: Procedure(s) (LRB): HYSTERECTOMY ABDOMINAL (N/A) SALPINGO OOPHERECTOMY (Bilateral)  Patient Location: PACU  Anesthesia Type: General  Level of Consciousness: awake  Airway & Oxygen Therapy: Patient Spontanous Breathing and non-rebreather face mask  Post-op Assessment: Report given to PACU RN, Post -op Vital signs reviewed and stable and Patient moving all extremities  Post vital signs: Reviewed and stable  Complications: No apparent anesthesia complications

## 2011-08-15 NOTE — Anesthesia Postprocedure Evaluation (Signed)
Anesthesia Post Note  Patient: Teresa [name]  Procedure(s) Performed: Procedure(s) (LRB): HYSTERECTOMY ABDOMINAL (N/A) SALPINGO OOPHERECTOMY (Bilateral)  Anesthesia type: General  Patient location: PACU  Post pain: Pain level controlled  Post assessment: Post-op Vital signs reviewed, Patient's Cardiovascular Status Stable, Respiratory Function Stable, Patent Airway, No signs of Nausea or vomiting and Pain level controlled  Last Vitals:  Filed Vitals:   08/15/11 1050  BP: 118/74  Pulse: 89  Temp: 36.5 C  Resp: 20    Post vital signs: Reviewed and stable  Level of consciousness: awake and alert   Complications: No apparent anesthesia complications

## 2011-08-15 NOTE — Op Note (Signed)
Preoperative diagnosis:  1.  Chronic Pelvic Pain                                          2.  Dyspareunia                                         3.  Dysmenorrhea                                         4.  Menometrorrhagia                                         5.  Unresponsive to conservative measures  Postoperative diagnosis:  Same as above  Procedure:  Abdominal hysterectomy, total and bilateral salpingo oophorectomy  Surgeon:  Lazaro Arms  Assistant:    Anesthesia:  General endotracheal  Preoperative clinical summary:  See H&P  Intraoperative findings: Patient had the left ovarian cyst as anticipated.  There were some adhesions of the omentum  There was no endometriosis or other abnormalities to explain the patient's reported pain.  Description of operation:  Patient was taken to the operating room and placed in the supine position where she underwent general endotracheal anesthesia.  She was then prepped and draped in the usual sterile fashion and a Foley catheter was placed for continuous bladder drainage.  A Pfannenstiel skin incision was made and carried down sharply to the rectus fascia which was scored in the midline and extended laterally.  The fascia was taken off the muscles superiorly and inferiorly without difficulty.  The muscles were divided.  The peritoneal cavity was entered.  A medium Alexis self-retaining retractor was placed.  The upper abdomen was packed away. Both uterine cornu were grasped with Coker clamps.  The left round ligament was suture ligated and coagulated with the electrocautery unit.  The left vesicouterine serosal flap was created.  An avascular window in in the peritoneum was created and the infundibulo pelvic ligament was cross clamped, cut and suture ligated.  The right round ligament was suture ligated and cut with the electrocautery unit.  The vesicouterine serosal flap on the right was created.  An avascular window in the peritoneum was created and  the right infundibulo pelvic ligament was cross clamped, cut and double suture ligated.  Thus both ovaries were removed.  The uterine vessels were skeletonized bilaterally.  The uterine vessels were clamped bilaterally,  then cut and suture ligated.  Two more pedicles were taken down the cervix medial to the uterine vessels.  Each pedicle was clamped cut and suture ligated with good resulting hemostasis.  The vagina was cross clamped and the specimen was removed.  The vagina was then closed anterior to posterior.  The pelvis was irrigated vigorously and all pedicles were examined and found to be hemostatic.    All specimens were sent to pathology for routine evaluation.  The Alexis self-retaining retractor was removed and the pelvis was irrigated vigorously.  All packs were removed and all counts were correct at this point x 3.  The muscles and peritoneum were reapproximated  loosely.  The fascia was closed with 0 Vicryl running.   The skin was closed using 3-0 Vicryl on a Keith needle in a subcuticular fashion.  Dermabond was then applied for additional wound integrity and to serve as a postoperative bacterial barrier.  The patient was awakened from anesthesia taken to the recovery room in good stable condition. All sponge instrument and needle counts were correct x 3.  The patient received 400 mg of Ciprofloxacin and 900 mg of Cleocin and 30 mgToradol prophylactically preoperatively.  Estimated blood loss for the procedure was 200  cc.  Teresa [name] H 08/15/2011 10:42 AM

## 2011-08-15 NOTE — Anesthesia Procedure Notes (Signed)
Procedure Name: Intubation Date/Time: 08/15/2011 8:48 AM Performed by: Franco Nones Pre-anesthesia Checklist: Patient identified, Patient being monitored, Timeout performed, Emergency Drugs available and Suction available Patient Re-evaluated:Patient Re-evaluated prior to inductionOxygen Delivery Method: Circle System Utilized Preoxygenation: Pre-oxygenation with 100% oxygen Intubation Type: IV induction Ventilation: Mask ventilation without difficulty Laryngoscope Size: Miller and 2 Grade View: Grade I Tube type: Oral Tube size: 7.0 mm Number of attempts: 1 Airway Equipment and Method: stylet Placement Confirmation: ETT inserted through vocal cords under direct vision,  positive ETCO2 and breath sounds checked- equal and bilateral Secured at: 21 cm Tube secured with: Tape Dental Injury: Teeth and Oropharynx as per pre-operative assessment

## 2011-08-16 LAB — CBC
HCT: 32.4 % — ABNORMAL LOW (ref 36.0–46.0)
MCH: 29.2 pg (ref 26.0–34.0)
MCHC: 32.4 g/dL (ref 30.0–36.0)
MCV: 90 fL (ref 78.0–100.0)
Platelets: 232 10*3/uL (ref 150–400)
RDW: 13.4 % (ref 11.5–15.5)
WBC: 9.6 10*3/uL (ref 4.0–10.5)

## 2011-08-16 LAB — BASIC METABOLIC PANEL
BUN: 5 mg/dL — ABNORMAL LOW (ref 6–23)
Calcium: 8.5 mg/dL (ref 8.4–10.5)
Chloride: 104 mEq/L (ref 96–112)
Creatinine, Ser: 0.69 mg/dL (ref 0.50–1.10)
GFR calc Af Amer: >90 mL/min (ref >90)

## 2011-08-16 MED ORDER — ONDANSETRON HCL 8 MG PO TABS: 8.0000 mg | ORAL_TABLET | Freq: Four times a day (QID) | ORAL | Status: AC | PRN

## 2011-08-16 MED ORDER — ESTRADIOL 2 MG PO TABS: 2.0000 mg | ORAL_TABLET | Freq: Every day | ORAL | Status: AC

## 2011-08-16 MED ORDER — OXYCODONE-ACETAMINOPHEN 5-325 MG PO TABS: 1.0000 | ORAL_TABLET | Freq: Four times a day (QID) | ORAL | Status: DC | PRN

## 2011-08-16 NOTE — Progress Notes (Signed)
Pt d/c instructions, medications, wound care, and follow up appts given to pt.  No issues or concerns addressed. Pt d/ced home at this time. Taken out of facility by staff via w/c.

## 2011-08-16 NOTE — Anesthesia Postprocedure Evaluation (Signed)
  Anesthesia Post-op Note  Patient: Teresa [name]  Procedure(s) Performed: Procedure(s) (LRB): HYSTERECTOMY ABDOMINAL (N/A) SALPINGO OOPHERECTOMY (Bilateral)  Patient Location: Room 308  Anesthesia Type: General  Level of Consciousness: awake, alert , oriented and patient cooperative  Airway and Oxygen Therapy: Patient Spontanous Breathing  Post-op Pain: mild  Post-op Assessment: Post-op Vital signs reviewed, Patient's Cardiovascular Status Stable, Respiratory Function Stable, Patent Airway, Pain level controlled, No headache and No backache  Post-op Vital Signs: Reviewed and stable  Complications: No apparent anesthesia complications

## 2011-08-16 NOTE — Addendum Note (Signed)
Addendum  created 08/16/11 0754 by Marolyn Hammock, CRNA   Modules edited:Notes Section

## 2011-08-16 NOTE — Discharge Instructions (Signed)
Hysterectomy  Care After  These instructions give you information on caring for yourself after your procedure. Your doctor may also give you more specific instructions. Call your doctor if you have any problems or questions after your procedure.  HOME CARE  Healing takes time. You may have discomfort, tenderness, puffiness (swelling), and bruising at the wound site. This may last for 2 weeks. This is normal and will get better.   Only take medicine as told by your doctor.   Do not take aspirin.   Do not drive when taking pain medicine.   Exercise, lift objects, drive, and get back to daily activites as told by your doctor.   Get back to your normal diet and activities as told by your doctor.   Get plenty of rest and sleep.   Do not douche, use tampons, or have sex (intercourse) for at least 6 weeks or as told.   Change your bandages (dressings) as told by your doctor.   Take your temperature during the day.   Take showers for 2 to 3 weeks. Do not take baths.   Do not drink alcohol until your doctor says it is okay.   Take a medicine to help you poop (laxative) as told by your doctor. Try eating bran foods. Drink enough fluids to keep your pee (urine) clear or pale yellow.   Have someone help you at home for 1 to 2 weeks after your surgery.   Keep follow-up doctor visits as told.  GET HELP RIGHT AWAY IF:    You have a fever.   You have bad belly (abdominal) pain.   You have chest pain.   You are short of breath.   You pass out (faint).   You have pain, puffiness, or redness of your leg.   You bleed a lot from your vagina and notice clumps of tissue (clots).   You have puffiness, redness, or pain where a tube was put in your vein (IV) or in the wound area.   You have yellowish-white fluid (pus) coming from the wound.   You have a bad smell coming from the wound or bandage.   Your wound pulls apart.   You feel dizzy or lightheaded.   You have pain or bleeding when you pee.   You keep having  watery poop (diarrhea).   You keep feeling sick to your stomach (nauseous) or keep throwing up (vomiting).   You have fluid (discharge) coming from your vagina.   You have a rash.   You have a reaction to your medicine.   You need stronger pain medicine.  MAKE SURE YOU:   Understand these instructions.   Will watch your condition.   Will get help right away if you are not doing well or get worse.  Document Released: 02/21/2008 Document Revised: 05/03/2011 Document Reviewed: 12/29/2010  Exit[employer] Patient Information 2012 [employer]

## 2011-08-17 ENCOUNTER — Encounter (HOSPITAL_COMMUNITY): Payer: Self-pay | Admitting: Obstetrics & Gynecology

## 2011-08-17 NOTE — Discharge Summary (Signed)
Physician Discharge Summary  Patient ID: Teresa [name] MRN: [id number] DOB/AGE: Jun 16, 1985 26 y.o.  Admit date: 08/15/2011 Discharge date: 08/17/2011  Admission Diagnoses: Chronic Pelvic pain  Discharge Diagnoses:  Normal post operative course  Discharged Condition: good  Hospital Course: unremarkable  Consults: None  Significant Diagnostic Studies: none  Treatments: surgery: TAHBSO  Discharge Exam: Blood pressure 97/66, pulse 85, temperature 98.6 F (37 C), temperature source Oral, resp. rate 20, height 5\' 5"  (1.651 m), weight 230 lb (104.327 kg), SpO2 97.00%. General appearance: alert, cooperative and no distress GI: soft, non-tender; bowel sounds normal; no masses,  no organomegaly  Disposition: 01-Home or Self Care  Discharge Orders    Future Appointments: Provider: Department: Dept Phone: Center:   11/07/2011 10:30 AM Joselyn Arrow, NP [facility] [phone] [facility]     Future Orders Please Complete By Expires   Diet - low sodium heart healthy      Increase activity slowly      Discharge wound care:      Comments:   Keep clean and dry   Call MD for:  temperature >100.4      Call MD for:  persistant nausea and vomiting      Call MD for:  severe uncontrolled pain        Medication List  As of 08/17/2011  1:00 PM   TAKE these medications         estradiol 2 MG tablet   Commonly known as: ESTRACE   Take 1 tablet (2 mg total) by mouth daily.      levothyroxine 75 MCG tablet   Commonly known as: SYNTHROID, LEVOTHROID   Take 75 mcg by mouth daily.      ondansetron 8 MG tablet   Commonly known as: ZOFRAN   Take 1 tablet (8 mg total) by mouth every 6 (six) hours as needed for nausea.      oxyCODONE-acetaminophen 5-325 MG per tablet   Commonly known as: PERCOCET   Take 1-2 tablets by mouth every 6 (six) hours as needed. For pain      pantoprazole 40 MG tablet   Commonly known as: PROTONIX   Take 1 tablet (40 mg total) by mouth daily.            Follow-up Information    Follow up with Lazaro Arms, MD in 1 week.   Contact information:   [facility] [address] [place] [address] [phone]          Signed: Lazaro Arms 08/17/2011, 1:00 PM

## 2011-11-01 ENCOUNTER — Encounter: Payer: Self-pay | Admitting: Internal Medicine

## 2011-11-07 ENCOUNTER — Ambulatory Visit: Payer: Medicaid Other | Admitting: Urgent Care

## 2011-11-07 ENCOUNTER — Telehealth: Payer: Self-pay | Admitting: Urgent Care

## 2011-11-07 NOTE — Telephone Encounter (Signed)
Pt was a no show

## 2011-11-07 NOTE — Telephone Encounter (Signed)
Noted  

## 2011-12-26 ENCOUNTER — Ambulatory Visit: Payer: Medicaid Other | Admitting: Physician Assistant

## 2011-12-28 ENCOUNTER — Encounter: Payer: Self-pay | Admitting: Adult Health

## 2011-12-28 ENCOUNTER — Ambulatory Visit (INDEPENDENT_AMBULATORY_CARE_PROVIDER_SITE_OTHER): Payer: Medicaid Other | Admitting: Adult Health

## 2011-12-28 VITALS — BP 118/72 | HR 89 | Ht 65.0 in | Wt 219.0 lb

## 2011-12-28 DIAGNOSIS — I4949 Other premature depolarization: Secondary | ICD-10-CM

## 2011-12-28 DIAGNOSIS — F419 Anxiety disorder, unspecified: Secondary | ICD-10-CM

## 2011-12-28 DIAGNOSIS — I493 Ventricular premature depolarization: Secondary | ICD-10-CM

## 2011-12-28 DIAGNOSIS — R1013 Epigastric pain: Secondary | ICD-10-CM

## 2011-12-28 NOTE — Progress Notes (Signed)
HPI: Teresa [name] is a 26 year old obese female patient of Dr. Kirke Corin presents to our office with recurrent chest pressure and palpitations. She was originally seen by Dr. Kirke Corin in June of 2012 after hospitalization for chest pain and palpitations. A cardiac monitor and stress test were completed all of which were found to be negative. He was diagnosed with stress and anxiety.    She has had a recent recurrence of presyncope and chest pressure associated with anxiety and worry concerning her children. She does not use any caffeine and has changed to diet [facility]. She had been seen in the past by a neurologist who gave her a prescription for Xanax. She would like a refill on this and she states this was the only thing that was helpful to her discomfort.  Allergies  Allergen Reactions  . Ceclor (Cefaclor) Hives  . Adhesive (Tape) Rash  . Latex Rash    Current Outpatient Prescriptions  Medication Sig Dispense Refill  . estradiol (ESTRACE) 2 MG tablet Take 1 tablet (2 mg total) by mouth daily.  30 tablet  12  . levothyroxine (SYNTHROID, LEVOTHROID) 75 MCG tablet Take 75 mcg by mouth daily.      Marland Kitchen DISCONTD: ranitidine (ZANTAC) 150 MG tablet Take 150 mg by mouth 2 (two) times daily.        Past Medical History  Diagnosis Date  . Asthma   . GERD (gastroesophageal reflux disease)   . Hiatal hernia   . Tobacco abuse   . PVC (premature ventricular contraction)   . Hypothyroidism     Past Surgical History  Procedure Date  . Cholecystectomy 2007  . Cesarean section 2007 and 2011  . Esophagogastroduodenoscopy 2008    incomplete due to inadequate conscious sedation, circumferential distal esophageal erosions, hiatal hernia  . Tubal ligation 2011  . Maloney dilation 07/26/2011    Procedure: [name] DILATION;  Surgeon: Corbin Ade, MD;  Location: [facility];  Service: Endoscopy;  Laterality: N/A;  40 [place]  . Esophageal biopsy 07/26/2011    Distal esophageal erosions consistent with  erosive reflux esophagitis.  Patulous EG junction, status post passage of a Maloney dilator (empirically), status post esophageal biopsy  followed dilation/ A 3-4 cm hiatal hernia, otherwise normal stomach, D1, D2.  . Abdominal hysterectomy 08/15/2011    Procedure: HYSTERECTOMY ABDOMINAL;  Surgeon: Lazaro Arms, MD;  Location: [facility];  Service: Gynecology;  Laterality: N/A;  . Salpingoophorectomy 08/15/2011    Procedure: SALPINGO OOPHERECTOMY;  Surgeon: Lazaro Arms, MD;  Location: [facility];  Service: Gynecology;  Laterality: Bilateral;    AOZ:HYQMVH of systems complete and found to be negative unless listed above  PHYSICAL EXAM BP 118/72  Pulse 89  Ht 5\' 5"  (1.651 m)  Wt 219 lb (99.338 kg)  BMI 36.44 kg/m2  General: Well developed, well nourished, in no acute distress Head: Eyes PERRLA, No xanthomas.   Normal cephalic and atramatic  Lungs: Clear bilaterally to auscultation and percussion. Heart: HRRR S1 S2, without MRG.  Pulses are 2+ & equal.            No carotid bruit. No JVD.  No abdominal bruits. No femoral bruits. Abdomen: Bowel sounds are positive, abdomen soft and non-tender without masses or                  Hernia's noted. Msk:  Back normal, normal gait. Normal strength and tone for age. Extremities: No clubbing, cyanosis or edema.  DP +1 Neuro: Alert and  oriented X 3. Psych:  Good affect, responds appropriately  EKG: Normal sinus rhythm with sinus arrhythmia. No evidence of preexcitation.   ASSESSMENT AND PLAN

## 2011-12-28 NOTE — Assessment & Plan Note (Addendum)
She continues to have some complaints of palpitations but mostly complaining of chest pressure associated with worry and anxiety. She states that the pressure is a 7/10 but she appears quite comfortable on exam and discussion of her symptoms. She is requesting Xanax to assist with her chest pressure. This is not a medication I will prescribe and have explained this to her. I suggested she followup with her primary care physician, Dr. Sudie Bailey, but she states he will not give her any refills.     I suggested that she be referred to behavioral health for assistance and anxiety and possible prescription for Xanax through their psychiatrist. Pain management specialist is also suggested should she continue to have pressure in her chest which she states is not related to anxiety. Review of the notes from Dr.Arida with a thorough evaluation. This does not point to cardiac etiology of chest discomfort in this patient. No further cardiac testing is recommended we will see her on a when necessary basis.

## 2011-12-28 NOTE — Patient Instructions (Addendum)
You have been referred to [facility] for anxiety.  You should contact Dr. Jon Gills office for pain management.

## 2011-12-28 NOTE — Assessment & Plan Note (Signed)
She will continue with PPI and to followup with GI when necessary.

## 2011-12-31 NOTE — Addendum Note (Signed)
Addended by: Reather Laurence A on: 12/31/2011 12:50 PM   Modules accepted: Orders

## 2012-02-11 DIAGNOSIS — R079 Chest pain, unspecified: Secondary | ICD-10-CM

## 2012-02-22 DIAGNOSIS — R079 Chest pain, unspecified: Secondary | ICD-10-CM

## 2012-10-21 ENCOUNTER — Encounter (HOSPITAL_COMMUNITY): Payer: Self-pay | Admitting: Emergency Medicine

## 2012-10-21 DIAGNOSIS — M545 Low back pain, unspecified: Secondary | ICD-10-CM | POA: Insufficient documentation

## 2012-10-21 DIAGNOSIS — R52 Pain, unspecified: Secondary | ICD-10-CM | POA: Insufficient documentation

## 2012-10-21 DIAGNOSIS — M546 Pain in thoracic spine: Secondary | ICD-10-CM | POA: Insufficient documentation

## 2012-10-21 DIAGNOSIS — E039 Hypothyroidism, unspecified: Secondary | ICD-10-CM | POA: Insufficient documentation

## 2012-10-21 DIAGNOSIS — Z91040 Latex allergy status: Secondary | ICD-10-CM | POA: Insufficient documentation

## 2012-10-21 DIAGNOSIS — J45909 Unspecified asthma, uncomplicated: Secondary | ICD-10-CM | POA: Insufficient documentation

## 2012-10-21 DIAGNOSIS — Z79899 Other long term (current) drug therapy: Secondary | ICD-10-CM | POA: Insufficient documentation

## 2012-10-21 DIAGNOSIS — Z8679 Personal history of other diseases of the circulatory system: Secondary | ICD-10-CM | POA: Insufficient documentation

## 2012-10-21 DIAGNOSIS — F17200 Nicotine dependence, unspecified, uncomplicated: Secondary | ICD-10-CM | POA: Insufficient documentation

## 2012-10-21 DIAGNOSIS — Z8719 Personal history of other diseases of the digestive system: Secondary | ICD-10-CM | POA: Insufficient documentation

## 2012-10-21 NOTE — ED Notes (Signed)
Pt c/o back pain after a 27 year old landed on her back yesterday with her knees.

## 2012-10-22 ENCOUNTER — Emergency Department (HOSPITAL_COMMUNITY)
Admission: EM | Admit: 2012-10-22 | Discharge: 2012-10-22 | Disposition: A | Discharge status: Home / Self Care | Payer: Medicaid Other | Attending: Emergency Medicine | Admitting: Emergency Medicine

## 2012-10-22 ENCOUNTER — Emergency Department (HOSPITAL_COMMUNITY): Payer: Medicaid Other

## 2012-10-22 DIAGNOSIS — M549 Dorsalgia, unspecified: Secondary | ICD-10-CM

## 2012-10-22 MED ORDER — IBUPROFEN 600 MG PO TABS: 600.0000 mg | ORAL_TABLET | Freq: Four times a day (QID) | ORAL | Status: DC | PRN

## 2012-10-22 MED ORDER — OXYCODONE-ACETAMINOPHEN 5-325 MG PO TABS
1.0000 | ORAL_TABLET | Freq: Once | ORAL | Status: AC
  Administered 2012-10-22: 1 via ORAL
  Filled 2012-10-22: qty 1

## 2012-10-22 MED ORDER — OXYCODONE-ACETAMINOPHEN 5-325 MG PO TABS: 1.0000 | ORAL_TABLET | ORAL | Status: DC | PRN

## 2012-10-22 NOTE — ED Provider Notes (Signed)
Medical screening examination/treatment/procedure(s) were performed by non-physician practitioner and as supervising physician I was immediately available for consultation/collaboration. Devoria Albe, MD, Armando Gang   Ward Givens, MD 10/22/12 Earle Gell

## 2012-10-22 NOTE — ED Provider Notes (Signed)
History     CSN: [id number]  Arrival date & time 10/21/12  2219   First MD Initiated Contact with Patient 10/22/12 0033      Chief Complaint  Patient presents with  . Back Pain    (Consider location/radiation/quality/duration/timing/severity/associated sxs/prior treatment) Patient is a 27 y.o. female presenting with back pain. The history is provided by the patient.  Back Pain Location:  Thoracic spine and lumbar spine Quality:  Shooting Radiates to:  Does not radiate Pain severity:  Severe Pain is:  Same all the time Onset quality:  Sudden Duration:  1 day Timing:  Constant Progression:  Worsening Chronicity:  New Context: recent injury   Context comment:  She fell when inside a ball pit yesterday.  A 27 year old landed with her knees directly on her mid and lower back. Relieved by:  Nothing Worsened by:  Movement, bending and touching Ineffective treatments:  NSAIDs (she has tried aleve with no relief of pain.) Associated symptoms: no abdominal pain, no bladder incontinence, no bowel incontinence, no chest pain, no dysuria, no fever, no numbness, no paresthesias, no perianal numbness, no tingling and no weakness     Past Medical History  Diagnosis Date  . Asthma   . GERD (gastroesophageal reflux disease)   . Hiatal hernia   . Tobacco abuse   . PVC (premature ventricular contraction)   . Hypothyroidism     Past Surgical History  Procedure Laterality Date  . Cholecystectomy  2007  . Cesarean section  2007 and 2011  . Esophagogastroduodenoscopy  2008    incomplete due to inadequate conscious sedation, circumferential distal esophageal erosions, hiatal hernia  . Tubal ligation  2011  . Maloney dilation  07/26/2011    Procedure: [name] DILATION;  Surgeon: Corbin Ade, MD;  Location: [facility];  Service: Endoscopy;  Laterality: N/A;  68 [place]  . Esophageal biopsy  07/26/2011    Distal esophageal erosions consistent with erosive reflux esophagitis.  Patulous  EG junction, status post passage of a Maloney dilator (empirically), status post esophageal biopsy  followed dilation/ A 3-4 cm hiatal hernia, otherwise normal stomach, D1, D2.  . Abdominal hysterectomy  08/15/2011    Procedure: HYSTERECTOMY ABDOMINAL;  Surgeon: Lazaro Arms, MD;  Location: [facility];  Service: Gynecology;  Laterality: N/A;  . Salpingoophorectomy  08/15/2011    Procedure: SALPINGO OOPHERECTOMY;  Surgeon: Lazaro Arms, MD;  Location: [facility];  Service: Gynecology;  Laterality: Bilateral;    Family History  Problem Relation Age of Onset  . Ulcers Mother     Questionable history  . Liver disease Neg Hx   . Cancer Neg Hx     Colorectal cancer  . Anesthesia problems Neg Hx   . Hypotension Neg Hx   . Malignant hyperthermia Neg Hx   . Pseudochol deficiency Neg Hx     History  Substance Use Topics  . Smoking status: Current Every Day Smoker -- 0.30 packs/day for 4 years    Types: Cigarettes  . Smokeless tobacco: Not on file  . Alcohol Use: No    OB History   Grav Para Term Preterm Abortions TAB SAB Ect Mult Living                  Review of Systems  Constitutional: Negative for fever.  Respiratory: Negative for shortness of breath.   Cardiovascular: Negative for chest pain and leg swelling.  Gastrointestinal: Negative for abdominal pain, constipation, abdominal distention and bowel incontinence.  Genitourinary:  Negative for bladder incontinence, dysuria, urgency, frequency, flank pain and difficulty urinating.  Musculoskeletal: Positive for back pain. Negative for joint swelling and gait problem.  Skin: Negative for rash.  Neurological: Negative for tingling, weakness, numbness and paresthesias.    Allergies  Ceclor; Hydrocodone; Adhesive; and Latex  Home Medications   Current Outpatient Rx  Name  Route  Sig  Dispense  Refill  . ibuprofen (ADVIL,MOTRIN) 600 MG tablet   Oral   Take 1 tablet (600 mg total) by mouth every 6 (six) hours as needed for pain.   30  tablet   0   . levothyroxine (SYNTHROID, LEVOTHROID) 75 MCG tablet   Oral   Take 75 mcg by mouth daily.         Marland Kitchen oxyCODONE-acetaminophen (PERCOCET/ROXICET) 5-325 MG per tablet   Oral   Take 1 tablet by mouth every 4 (four) hours as needed for pain.   15 tablet   0     BP 117/82  Pulse 94  Temp(Src) 97.9 F (36.6 C) (Oral)  Resp 18  Ht 5\' 5"  (1.651 m)  Wt 210 lb (95.255 kg)  BMI 34.95 kg/m2  SpO2 100%  LMP 05/28/2009  Physical Exam  Nursing note and vitals reviewed. Constitutional: She appears well-developed and well-nourished.  HENT:  Head: Normocephalic.  Eyes: Conjunctivae are normal.  Neck: Normal range of motion. Neck supple.  Cardiovascular: Normal rate and intact distal pulses.   Pedal pulses normal.  Pulmonary/Chest: Effort normal.  Abdominal: Soft. Bowel sounds are normal. She exhibits no distension and no mass.  Musculoskeletal: Normal range of motion. She exhibits no edema.       Thoracic back: She exhibits bony tenderness. She exhibits no swelling, no edema, no deformity and no spasm.       Lumbar back: She exhibits tenderness. She exhibits no swelling, no edema and no spasm.  Neurological: She is alert. She has normal strength. She displays no atrophy and no tremor. No sensory deficit. Gait normal.  Reflex Scores:      Patellar reflexes are 2+ on the right side and 2+ on the left side.      Achilles reflexes are 2+ on the right side and 2+ on the left side. No strength deficit noted in hip and knee flexor and extensor muscle groups.  Ankle flexion and extension intact.  Skin: Skin is warm and dry.  Psychiatric: She has a normal mood and affect.    ED Course  Procedures (including critical care time)  Labs Reviewed - No data to display Dg Thoracic Spine W/swimmers  10/22/2012   *RADIOLOGY REPORT*  Clinical Data: 27 year old jumped on back; mid back pain.  THORACIC SPINE - 2 VIEW + SWIMMERS  Comparison: Chest radiograph performed 04/22/2012, and CTA  of the chest performed 11/14/2010  Findings: There is no evidence of fracture or subluxation. Vertebral bodies demonstrate normal height and alignment. Intervertebral disc spaces are preserved.  Minimal right convex lower thoracic scoliosis is noted.  The visualized portions of both lungs are clear.  The mediastinum is unremarkable in appearance.  Clips are noted within the right upper quadrant, reflecting prior cholecystectomy.  IMPRESSION: No evidence of fracture or subluxation along the thoracic spine.   Original Report Authenticated By: Tonia Ghent, M.D.   Dg Lumbar Spine Complete  10/22/2012   *RADIOLOGY REPORT*  Clinical Data: 27 year old jumped on patient's back; mid and lower back pain.  LUMBAR SPINE - COMPLETE 4+ VIEW  Comparison: CT of the abdomen and  pelvis performed 02/22/2012, and abdominal radiograph performed 07/12/2012  Findings: There is no evidence of fracture or subluxation.  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  Vertebral bodies demonstrate normal height and alignment.  Intervertebral disc spaces are preserved.  The visualized neural foramina are grossly unremarkable in appearance.  The visualized bowel gas pattern is unremarkable in appearance; air and stool are noted within the colon.  The sacroiliac joints are within normal limits.  Clips are noted within the right upper quadrant, reflecting prior cholecystectomy.  IMPRESSION:  1.  No evidence of fracture or subluxation along the lumbar spine. 2.  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   Original Report Authenticated By: Tonia Ghent, M.D.     1. Back pain, acute       MDM  Pt prescribed oxycodone,  Ibuprofen.  Encouraged ice packs, rest.  Recheck by pcp if not improved over the next week.  Patients labs and/or radiological studies were viewed and considered during the medical decision making and disposition process.  No  neuro deficit on exam or by history to suggest emergent or surgical presentation.  Also discussed worsened sx that should prompt immediate re-evaluation including distal weakness, bowel/bladder retention/incontinence.              Burgess Amor, PA-C 10/22/12 [phone]

## 2012-11-23 DIAGNOSIS — R079 Chest pain, unspecified: Secondary | ICD-10-CM

## 2013-03-25 ENCOUNTER — Telehealth: Payer: Self-pay | Admitting: Obstetrics & Gynecology

## 2013-03-25 NOTE — Telephone Encounter (Signed)
Pt states 3 years ago had c-section and had hysterectomy x 1 year, abdominal pain at old scars. Call transferred to front staff for soonest available appt.

## 2013-03-26 ENCOUNTER — Ambulatory Visit: Payer: Self-pay | Admitting: Adult Health

## 2013-04-02 ENCOUNTER — Other Ambulatory Visit: Payer: Self-pay

## 2013-04-25 DIAGNOSIS — E039 Hypothyroidism, unspecified: Secondary | ICD-10-CM | POA: Insufficient documentation

## 2013-04-25 DIAGNOSIS — R1031 Right lower quadrant pain: Secondary | ICD-10-CM | POA: Insufficient documentation

## 2013-04-25 DIAGNOSIS — Z90710 Acquired absence of both cervix and uterus: Secondary | ICD-10-CM | POA: Insufficient documentation

## 2013-04-25 DIAGNOSIS — I4949 Other premature depolarization: Secondary | ICD-10-CM | POA: Insufficient documentation

## 2013-04-25 DIAGNOSIS — R112 Nausea with vomiting, unspecified: Secondary | ICD-10-CM | POA: Insufficient documentation

## 2013-04-25 DIAGNOSIS — F17200 Nicotine dependence, unspecified, uncomplicated: Secondary | ICD-10-CM | POA: Insufficient documentation

## 2013-04-25 DIAGNOSIS — Z9851 Tubal ligation status: Secondary | ICD-10-CM | POA: Insufficient documentation

## 2013-04-25 DIAGNOSIS — Z79899 Other long term (current) drug therapy: Secondary | ICD-10-CM | POA: Insufficient documentation

## 2013-04-25 DIAGNOSIS — J45909 Unspecified asthma, uncomplicated: Secondary | ICD-10-CM | POA: Insufficient documentation

## 2013-04-25 DIAGNOSIS — Z9889 Other specified postprocedural states: Secondary | ICD-10-CM | POA: Insufficient documentation

## 2013-04-25 DIAGNOSIS — Z8719 Personal history of other diseases of the digestive system: Secondary | ICD-10-CM | POA: Insufficient documentation

## 2013-04-25 DIAGNOSIS — Z9089 Acquired absence of other organs: Secondary | ICD-10-CM | POA: Insufficient documentation

## 2013-04-25 DIAGNOSIS — Z91040 Latex allergy status: Secondary | ICD-10-CM | POA: Insufficient documentation

## 2013-04-26 ENCOUNTER — Emergency Department (HOSPITAL_COMMUNITY): Payer: Medicaid Other

## 2013-04-26 ENCOUNTER — Encounter (HOSPITAL_COMMUNITY): Payer: Self-pay | Admitting: Emergency Medicine

## 2013-04-26 ENCOUNTER — Emergency Department (HOSPITAL_COMMUNITY)
Admission: EM | Admit: 2013-04-26 | Discharge: 2013-04-26 | Disposition: A | Discharge status: Home / Self Care | Payer: Medicaid Other | Attending: Emergency Medicine | Admitting: Emergency Medicine

## 2013-04-26 DIAGNOSIS — R109 Unspecified abdominal pain: Secondary | ICD-10-CM

## 2013-04-26 DIAGNOSIS — R112 Nausea with vomiting, unspecified: Secondary | ICD-10-CM

## 2013-04-26 LAB — URINE MICROSCOPIC-ADD ON

## 2013-04-26 LAB — COMPREHENSIVE METABOLIC PANEL
Albumin: 3.6 g/dL (ref 3.5–5.2)
BUN: 18 mg/dL (ref 6–23)
Creatinine, Ser: 0.8 mg/dL (ref 0.50–1.10)
Total Protein: 7.6 g/dL (ref 6.0–8.3)

## 2013-04-26 LAB — CBC WITH DIFFERENTIAL/PLATELET
Basophils Relative: 0 % (ref 0–1)
Eosinophils Absolute: 0.2 10*3/uL (ref 0.0–0.7)
HCT: 37.5 % (ref 36.0–46.0)
Hemoglobin: 12 g/dL (ref 12.0–15.0)
MCH: 28.9 pg (ref 26.0–34.0)
MCHC: 32 g/dL (ref 30.0–36.0)
Monocytes Absolute: 0.8 10*3/uL (ref 0.1–1.0)
Monocytes Relative: 5 % (ref 3–12)
Neutrophils Relative %: 76 % (ref 43–77)
RDW: 12.9 % (ref 11.5–15.5)

## 2013-04-26 LAB — URINALYSIS, ROUTINE W REFLEX MICROSCOPIC
Bilirubin Urine: NEGATIVE
Ketones, ur: NEGATIVE mg/dL
Nitrite: NEGATIVE
Protein, ur: NEGATIVE mg/dL
pH: 6 (ref 5.0–8.0)

## 2013-04-26 MED ORDER — ONDANSETRON HCL 4 MG/2ML IJ SOLN
4.0000 mg | Freq: Once | INTRAMUSCULAR | Status: AC
  Administered 2013-04-26: 4 mg via INTRAVENOUS
  Filled 2013-04-26: qty 2

## 2013-04-26 MED ORDER — ONDANSETRON 8 MG PO TBDP: 8.0000 mg | ORAL_TABLET | Freq: Three times a day (TID) | ORAL | Status: DC | PRN

## 2013-04-26 MED ORDER — HYDROMORPHONE HCL PF 1 MG/ML IJ SOLN
1.0000 mg | Freq: Once | INTRAMUSCULAR | Status: AC
  Administered 2013-04-26: 1 mg via INTRAVENOUS
  Filled 2013-04-26: qty 1

## 2013-04-26 MED ORDER — HYDROCODONE-ACETAMINOPHEN 5-325 MG PO TABS: 1.0000 | ORAL_TABLET | ORAL | Status: DC | PRN

## 2013-04-26 MED ORDER — IOHEXOL 300 MG/ML  SOLN
50.0000 mL | Freq: Once | INTRAMUSCULAR | Status: AC | PRN
  Administered 2013-04-26: 50 mL via ORAL

## 2013-04-26 MED ORDER — IOHEXOL 300 MG/ML  SOLN
100.0000 mL | Freq: Once | INTRAMUSCULAR | Status: AC | PRN
  Administered 2013-04-26: 100 mL via INTRAVENOUS

## 2013-04-26 NOTE — ED Notes (Signed)
Patient complaining diffuse abdominal pain, worse pain to right lower quadrant. Reports started yesterday afternoon. Also reports nausea and vomiting.

## 2013-04-26 NOTE — ED Provider Notes (Signed)
CSN: [id number]     Arrival date & time 04/25/13  2352 History   First MD Initiated Contact with Patient 04/26/13 0011     Chief Complaint  Patient presents with  . Abdominal Pain    HPI Patient reports developing nausea vomiting as well as worsening right lower quadrant pain throughout the day.  She denies hematemesis.  No diarrhea.  No melena or hematochezia.  No fevers or chills.  Patient started at her gallbladder removed..  No urinary complaints.  No new vaginal discharge or abnormal vaginal bleeding.  She's had a hysterectomy and bilateral salpingo-oophorectomy.  Her pain is moderate to severe in severity.  No other complaints   Past Medical History  Diagnosis Date  . Asthma   . GERD (gastroesophageal reflux disease)   . Hiatal hernia   . Tobacco abuse   . PVC (premature ventricular contraction)   . Hypothyroidism    Past Surgical History  Procedure Laterality Date  . Cholecystectomy  2007  . Cesarean section  2007 and 2011  . Esophagogastroduodenoscopy  2008    incomplete due to inadequate conscious sedation, circumferential distal esophageal erosions, hiatal hernia  . Tubal ligation  2011  . Maloney dilation  07/26/2011    Procedure: [name] DILATION;  Surgeon: Corbin Ade, MD;  Location: [facility];  Service: Endoscopy;  Laterality: N/A;  76 [place]  . Esophageal biopsy  07/26/2011    Distal esophageal erosions consistent with erosive reflux esophagitis.  Patulous EG junction, status post passage of a Maloney dilator (empirically), status post esophageal biopsy  followed dilation/ A 3-4 cm hiatal hernia, otherwise normal stomach, D1, D2.  . Abdominal hysterectomy  08/15/2011    Procedure: HYSTERECTOMY ABDOMINAL;  Surgeon: Lazaro Arms, MD;  Location: [facility];  Service: Gynecology;  Laterality: N/A;  . Salpingoophorectomy  08/15/2011    Procedure: SALPINGO OOPHERECTOMY;  Surgeon: Lazaro Arms, MD;  Location: [facility];  Service: Gynecology;  Laterality: Bilateral;    Family History  Problem Relation Age of Onset  . Ulcers Mother     Questionable history  . Liver disease Neg Hx   . Cancer Neg Hx     Colorectal cancer  . Anesthesia problems Neg Hx   . Hypotension Neg Hx   . Malignant hyperthermia Neg Hx   . Pseudochol deficiency Neg Hx    History  Substance Use Topics  . Smoking status: Current Every Day Smoker -- 0.30 packs/day for 4 years    Types: Cigarettes  . Smokeless tobacco: Not on file  . Alcohol Use: No   OB History   Grav Para Term Preterm Abortions TAB SAB Ect Mult Living                 Review of Systems  All other systems reviewed and are negative.    Allergies  Betadine; Ceclor; Hydrocodone; Morphine and related; Adhesive; and Latex  Home Medications   Current Outpatient Rx  Name  Route  Sig  Dispense  Refill  . HYDROcodone-acetaminophen (NORCO/VICODIN) 5-325 MG per tablet   Oral   Take 1 tablet by mouth every 4 (four) hours as needed for moderate pain.   15 tablet   0   . ibuprofen (ADVIL,MOTRIN) 600 MG tablet   Oral   Take 1 tablet (600 mg total) by mouth every 6 (six) hours as needed for pain.   30 tablet   0   . levothyroxine (SYNTHROID, LEVOTHROID) 75 MCG tablet   Oral  Take 75 mcg by mouth daily.         . ondansetron (ZOFRAN ODT) 8 MG disintegrating tablet   Oral   Take 1 tablet (8 mg total) by mouth every 8 (eight) hours as needed for nausea or vomiting.   10 tablet   0   . oxyCODONE-acetaminophen (PERCOCET/ROXICET) 5-325 MG per tablet   Oral   Take 1 tablet by mouth every 4 (four) hours as needed for pain.   15 tablet   0    BP 123/75  Pulse 99  Temp(Src) 97.6 F (36.4 C) (Oral)  Resp 22  Ht 5\' 5"  (1.651 m)  Wt 210 lb (95.255 kg)  BMI 34.95 kg/m2  SpO2 98%  LMP 05/28/2009 Physical Exam  Nursing note and vitals reviewed. Constitutional: She is oriented to person, place, and time. She appears well-developed and well-nourished. No distress.  HENT:  Head: Normocephalic and  atraumatic.  Eyes: EOM are normal.  Neck: Normal range of motion.  Cardiovascular: Normal rate, regular rhythm and normal heart sounds.   Pulmonary/Chest: Effort normal and breath sounds normal.  Abdominal: Soft. She exhibits no distension.  Right lower quadrant tenderness without guarding or rebound  Musculoskeletal: Normal range of motion.  Neurological: She is alert and oriented to person, place, and time.  Skin: Skin is warm and dry.  Psychiatric: She has a normal mood and affect. Judgment normal.    ED Course  Procedures (including critical care time) Labs Review Labs Reviewed  URINALYSIS, ROUTINE W REFLEX MICROSCOPIC - Abnormal; Notable for the following:    Hgb urine dipstick TRACE (*)    All other components within normal limits  CBC WITH DIFFERENTIAL - Abnormal; Notable for the following:    WBC 16.7 (*)    Neutro Abs 12.7 (*)    All other components within normal limits  COMPREHENSIVE METABOLIC PANEL - Abnormal; Notable for the following:    Glucose, Bld 104 (*)    Total Bilirubin 0.2 (*)    All other components within normal limits  URINE MICROSCOPIC-ADD ON - Abnormal; Notable for the following:    Squamous Epithelial / LPF FEW (*)    All other components within normal limits   Imaging Review Ct Abdomen Pelvis W Contrast  04/26/2013   CLINICAL DATA:  Abdominal pain.  Right lower quadrant pain.  EXAM: CT ABDOMEN AND PELVIS WITH CONTRAST  TECHNIQUE: Multidetector CT imaging of the abdomen and pelvis was performed using the standard protocol following bolus administration of intravenous contrast.  CONTRAST:  OMNIPAQUE IOHEXOL 300 MG/ML SOLN, 50mL OMNIPAQUE IOHEXOL 300 MG/ML SOLN  COMPARISON:  02/22/2012  FINDINGS: The lung bases are clear.  Surgical absence of the gallbladder. Small accessory spleen. The liver, spleen, pancreas, adrenal glands, kidneys, abdominal aorta, inferior vena cava, and retroperitoneal lymph nodes are unremarkable. The stomach, small bowel, and  colon are not abnormally distended. No free air or free fluid in the abdomen.  Pelvis: Uterus is surgically absent. The bladder is decompressed. No abnormal adnexal masses. Stool-filled rectosigmoid colon without inflammation. The appendix is normal. Normal alignment of the lumbar spine.  IMPRESSION: No acute process demonstrated in the abdomen or pelvis to account for the patient's pain.   Electronically Signed   By: Burman Nieves M.D.   On: 04/26/2013 02:34  I personally reviewed the imaging tests through PACS system I reviewed available ER/hospitalization records through the EMR   EKG Interpretation   None       MDM   1.  Nausea & vomiting   2. Abdominal pain    Appendix is visualized and CT scan is normal.  Labs, urine, CT scan without significant abnormalities.  Patient feels much better at this time.  Discharge home in good condition.  Close PCP followup.  She understands to return to the ER for new or worsening symptoms.    Lyanne Co, MD 04/26/13 0330

## 2013-07-07 ENCOUNTER — Encounter: Payer: Self-pay | Admitting: Cardiovascular Disease

## 2013-09-09 ENCOUNTER — Encounter: Payer: Self-pay | Admitting: Gastroenterology

## 2013-09-09 ENCOUNTER — Ambulatory Visit: Payer: Medicaid Other | Admitting: Gastroenterology

## 2013-09-09 ENCOUNTER — Telehealth: Payer: Self-pay | Admitting: Gastroenterology

## 2013-09-09 NOTE — Telephone Encounter (Signed)
Mailed letter °

## 2013-09-09 NOTE — Telephone Encounter (Signed)
Pt was a no show

## 2013-09-13 ENCOUNTER — Encounter (HOSPITAL_COMMUNITY): Payer: Self-pay | Admitting: Emergency Medicine

## 2013-09-13 ENCOUNTER — Emergency Department (HOSPITAL_COMMUNITY): Payer: Medicaid Other

## 2013-09-13 ENCOUNTER — Emergency Department (HOSPITAL_COMMUNITY)
Admission: EM | Admit: 2013-09-13 | Discharge: 2013-09-13 | Disposition: A | Discharge status: Home / Self Care | Payer: Medicaid Other | Attending: Emergency Medicine | Admitting: Emergency Medicine

## 2013-09-13 DIAGNOSIS — R109 Unspecified abdominal pain: Secondary | ICD-10-CM

## 2013-09-13 DIAGNOSIS — Z8679 Personal history of other diseases of the circulatory system: Secondary | ICD-10-CM | POA: Insufficient documentation

## 2013-09-13 DIAGNOSIS — R110 Nausea: Secondary | ICD-10-CM | POA: Insufficient documentation

## 2013-09-13 DIAGNOSIS — J45909 Unspecified asthma, uncomplicated: Secondary | ICD-10-CM | POA: Insufficient documentation

## 2013-09-13 DIAGNOSIS — Z91040 Latex allergy status: Secondary | ICD-10-CM | POA: Insufficient documentation

## 2013-09-13 DIAGNOSIS — R1031 Right lower quadrant pain: Secondary | ICD-10-CM | POA: Insufficient documentation

## 2013-09-13 DIAGNOSIS — G8929 Other chronic pain: Secondary | ICD-10-CM | POA: Insufficient documentation

## 2013-09-13 DIAGNOSIS — Z90710 Acquired absence of both cervix and uterus: Secondary | ICD-10-CM | POA: Insufficient documentation

## 2013-09-13 DIAGNOSIS — F17200 Nicotine dependence, unspecified, uncomplicated: Secondary | ICD-10-CM | POA: Insufficient documentation

## 2013-09-13 DIAGNOSIS — Z8719 Personal history of other diseases of the digestive system: Secondary | ICD-10-CM | POA: Insufficient documentation

## 2013-09-13 DIAGNOSIS — E039 Hypothyroidism, unspecified: Secondary | ICD-10-CM | POA: Insufficient documentation

## 2013-09-13 DIAGNOSIS — Z79899 Other long term (current) drug therapy: Secondary | ICD-10-CM | POA: Insufficient documentation

## 2013-09-13 LAB — COMPREHENSIVE METABOLIC PANEL
ALK PHOS: 99 U/L (ref 39–117)
ALT: 12 U/L (ref 0–35)
AST: 13 U/L (ref 0–37)
Albumin: 3.4 g/dL — ABNORMAL LOW (ref 3.5–5.2)
BILIRUBIN TOTAL: 0.1 mg/dL — AB (ref 0.3–1.2)
BUN: 13 mg/dL (ref 6–23)
CHLORIDE: 105 meq/L (ref 96–112)
CO2: 28 meq/L (ref 19–32)
CREATININE: 0.77 mg/dL (ref 0.50–1.10)
Calcium: 9.1 mg/dL (ref 8.4–10.5)
GFR calc Af Amer: >90 mL/min (ref >90)
GFR calc non Af Amer: >90 mL/min (ref >90)
Glucose, Bld: 108 mg/dL — ABNORMAL HIGH (ref 70–99)
POTASSIUM: 3.8 meq/L (ref 3.7–5.3)
Sodium: 142 mEq/L (ref 137–147)
Total Protein: 7.3 g/dL (ref 6.0–8.3)

## 2013-09-13 LAB — CBC WITH DIFFERENTIAL/PLATELET
BASOS ABS: 0 10*3/uL (ref 0.0–0.1)
BASOS PCT: 0 % (ref 0–1)
Eosinophils Absolute: 0.2 10*3/uL (ref 0.0–0.7)
Eosinophils Relative: 2 % (ref 0–5)
HEMATOCRIT: 37.7 % (ref 36.0–46.0)
Hemoglobin: 12.2 g/dL (ref 12.0–15.0)
LYMPHS PCT: 26 % (ref 12–46)
Lymphs Abs: 3.2 10*3/uL (ref 0.7–4.0)
MCH: 28.8 pg (ref 26.0–34.0)
MCHC: 32.4 g/dL (ref 30.0–36.0)
MCV: 89.1 fL (ref 78.0–100.0)
MONO ABS: 0.6 10*3/uL (ref 0.1–1.0)
Monocytes Relative: 5 % (ref 3–12)
NEUTROS ABS: 8.2 10*3/uL — AB (ref 1.7–7.7)
NEUTROS PCT: 67 % (ref 43–77)
Platelets: 251 10*3/uL (ref 150–400)
RBC: 4.23 MIL/uL (ref 3.87–5.11)
RDW: 13.4 % (ref 11.5–15.5)
WBC: 12.1 10*3/uL — AB (ref 4.0–10.5)

## 2013-09-13 LAB — URINALYSIS, ROUTINE W REFLEX MICROSCOPIC
Bilirubin Urine: NEGATIVE
GLUCOSE, UA: NEGATIVE mg/dL
Hgb urine dipstick: NEGATIVE
KETONES UR: NEGATIVE mg/dL
LEUKOCYTES UA: NEGATIVE
Nitrite: NEGATIVE
Protein, ur: NEGATIVE mg/dL
Specific Gravity, Urine: >1.03 — ABNORMAL HIGH (ref 1.005–1.030)
Urobilinogen, UA: 0.2 mg/dL (ref 0.0–1.0)
pH: 5.5 (ref 5.0–8.0)

## 2013-09-13 LAB — LIPASE, BLOOD: Lipase: 26 U/L (ref 11–59)

## 2013-09-13 MED ORDER — PANTOPRAZOLE SODIUM 40 MG IV SOLR
40.0000 mg | Freq: Once | INTRAVENOUS | Status: AC
  Administered 2013-09-13: 40 mg via INTRAVENOUS

## 2013-09-13 MED ORDER — HYDROMORPHONE HCL PF 1 MG/ML IJ SOLN
1.0000 mg | Freq: Once | INTRAMUSCULAR | Status: AC
  Administered 2013-09-13: 1 mg via INTRAVENOUS
  Filled 2013-09-13: qty 1

## 2013-09-13 MED ORDER — IOHEXOL 300 MG/ML  SOLN
100.0000 mL | Freq: Once | INTRAMUSCULAR | Status: AC | PRN
  Administered 2013-09-13: 100 mL via INTRAVENOUS

## 2013-09-13 MED ORDER — SODIUM CHLORIDE 0.9 % IV BOLUS (SEPSIS)
1000.0000 mL | Freq: Once | INTRAVENOUS | Status: AC
  Administered 2013-09-13: 1000 mL via INTRAVENOUS

## 2013-09-13 MED ORDER — PANTOPRAZOLE SODIUM 40 MG IV SOLR
INTRAVENOUS | Status: AC
  Filled 2013-09-13: qty 40

## 2013-09-13 MED ORDER — HYDROMORPHONE HCL PF 1 MG/ML IJ SOLN
INTRAMUSCULAR | Status: AC
  Filled 2013-09-13: qty 1

## 2013-09-13 MED ORDER — ONDANSETRON HCL 4 MG/2ML IJ SOLN
4.0000 mg | Freq: Once | INTRAMUSCULAR | Status: AC
  Administered 2013-09-13: 4 mg via INTRAVENOUS
  Filled 2013-09-13: qty 2

## 2013-09-13 MED ORDER — ONDANSETRON 4 MG PO TBDP: ORAL_TABLET | ORAL | Status: DC

## 2013-09-13 MED ORDER — HYDROMORPHONE HCL PF 1 MG/ML IJ SOLN
1.0000 mg | Freq: Once | INTRAMUSCULAR | Status: AC
  Administered 2013-09-13: 1 mg via INTRAVENOUS

## 2013-09-13 MED ORDER — IOHEXOL 300 MG/ML  SOLN
50.0000 mL | Freq: Once | INTRAMUSCULAR | Status: AC | PRN
  Administered 2013-09-13: 50 mL via ORAL

## 2013-09-13 NOTE — ED Notes (Signed)
Patient ambulated back to her room. Moaning in pain when returning to the bed.

## 2013-09-13 NOTE — ED Provider Notes (Signed)
CSN: [id number]     Arrival date & time 09/13/13  0028 History   This chart was scribed for Loren Racer, MD by Shari Heritage, ED Scribe. The patient was seen in room APA04/APA04. Patient's care was started at 12:36 AM.    Chief Complaint  Patient presents with  . Abdominal Pain    Patient is a 28 y.o. female presenting with abdominal pain. The history is provided by the patient. No language interpreter was used.  Abdominal Pain Pain location:  RLQ Pain quality: sharp   Pain radiates to:  Does not radiate Pain severity:  Severe Onset quality:  Sudden Duration:  1 hour Timing:  Constant Chronicity:  New Context: not eating   Associated symptoms: nausea   Associated symptoms: no chest pain, no chills, no constipation, no diarrhea, no dysuria, no fever, no shortness of breath, no vaginal bleeding, no vaginal discharge and no vomiting   Risk factors: multiple surgeries     HPI Comments: Teresa [name] is a 28 y.o. female with a history of GERD who presents to the Emergency Department complaining of sudden onset severe, sharp right lower abdominal pain that began an hour ago. Patient states that she ate 3-4 hours prior to the onset of the symptoms. Patient reports that she has had pain like this before in the same location. She has associated nausea. She denies urinary symptoms, vomiting, shortness of breath, chest pain, headache, fever, chills, or other symptoms at this time. She has not taken any medications for symptom relief. She has a surgical history of cholecystectomy, C-section. EGD, abdominal hysterectomy, salpingoophorectomy. Patient reports that she has a GI specialist but cannot remember the name at this time.   Past Medical History  Diagnosis Date  . Asthma   . GERD (gastroesophageal reflux disease)   . Hiatal hernia   . Tobacco abuse   . PVC (premature ventricular contraction)   . Hypothyroidism    Past Surgical History  Procedure Laterality Date  . Cholecystectomy   2007  . Cesarean section  2007 and 2011  . Esophagogastroduodenoscopy  2008    incomplete due to inadequate conscious sedation, circumferential distal esophageal erosions, hiatal hernia  . Tubal ligation  2011  . Maloney dilation  07/26/2011    Procedure: [name] DILATION;  Surgeon: Corbin Ade, MD;  Location: [facility];  Service: Endoscopy;  Laterality: N/A;  [address] [place]  . Esophageal biopsy  07/26/2011    WUJ:WJXBJY esophageal erosions consistent with erosive reflux esophagitis.  Patulous EG junction, status post passage of a Maloney dilator (empirically), status post esophageal biopsy  followed dilation/ A 3-4 cm hiatal hernia, otherwise normal stomach, D1, D2.  . Abdominal hysterectomy  08/15/2011    Procedure: HYSTERECTOMY ABDOMINAL;  Surgeon: Lazaro Arms, MD;  Location: [facility];  Service: Gynecology;  Laterality: N/A;  . Salpingoophorectomy  08/15/2011    Procedure: SALPINGO OOPHERECTOMY;  Surgeon: Lazaro Arms, MD;  Location: [facility];  Service: Gynecology;  Laterality: Bilateral;   Family History  Problem Relation Age of Onset  . Ulcers Mother     Questionable history  . Liver disease Neg Hx   . Cancer Neg Hx     Colorectal cancer  . Anesthesia problems Neg Hx   . Hypotension Neg Hx   . Malignant hyperthermia Neg Hx   . Pseudochol deficiency Neg Hx    History  Substance Use Topics  . Smoking status: Current Every Day Smoker -- 0.30 packs/day for 4 years  Types: Cigarettes  . Smokeless tobacco: Not on file  . Alcohol Use: No   OB History   Grav Para Term Preterm Abortions TAB SAB Ect Mult Living                 Review of Systems  Constitutional: Negative for fever and chills.  Respiratory: Negative for shortness of breath.   Cardiovascular: Negative for chest pain.  Gastrointestinal: Positive for nausea and abdominal pain. Negative for vomiting, diarrhea, constipation and blood in stool.  Genitourinary: Negative for dysuria, flank pain, vaginal bleeding, vaginal  discharge and pelvic pain.  Musculoskeletal: Negative for back pain, neck pain and neck stiffness.  Skin: Negative for rash and wound.  Neurological: Negative for dizziness, weakness, light-headedness, numbness and headaches.  All other systems reviewed and are negative.      Allergies  Betadine; Ceclor; Hydrocodone; Morphine and related; Adhesive; and Latex  Home Medications   Prior to Admission medications   Medication Sig Start Date End Date Taking? Authorizing Provider  HYDROcodone-acetaminophen (NORCO/VICODIN) 5-325 MG per tablet Take 1 tablet by mouth every 4 (four) hours as needed for moderate pain. 04/26/13   Lyanne CoKevin M Campos, MD  ibuprofen (ADVIL,MOTRIN) 600 MG tablet Take 1 tablet (600 mg total) by mouth every 6 (six) hours as needed for pain. 10/22/12   Burgess AmorJulie Idol, PA-C  levothyroxine (SYNTHROID, LEVOTHROID) 75 MCG tablet Take 75 mcg by mouth daily.    Historical Provider, MD  ondansetron (ZOFRAN ODT) 8 MG disintegrating tablet Take 1 tablet (8 mg total) by mouth every 8 (eight) hours as needed for nausea or vomiting. 04/26/13   Lyanne CoKevin M Campos, MD  oxyCODONE-acetaminophen (PERCOCET/ROXICET) 5-325 MG per tablet Take 1 tablet by mouth every 4 (four) hours as needed for pain. 10/22/12   Burgess AmorJulie Idol, PA-C   Triage Vitals: BP 118/68  Pulse 98  Temp(Src) 97.8 F (36.6 C) (Oral)  Resp 16  SpO2 100%  LMP 05/28/2009  Physical Exam  Nursing note and vitals reviewed. Constitutional: She is oriented to person, place, and time. She appears well-developed and well-nourished. No distress.  The patient writhing in bed  HENT:  Head: Normocephalic and atraumatic.  Mouth/Throat: Oropharynx is clear and moist.  Eyes: EOM are normal. Pupils are equal, round, and reactive to light.  Neck: Normal range of motion. Neck supple.  Cardiovascular: Normal rate and regular rhythm.   Pulmonary/Chest: Effort normal and breath sounds normal. No respiratory distress. She has no wheezes. She has no  rales.  Abdominal: Soft. Bowel sounds are normal. She exhibits no distension and no mass. There is tenderness (right lower quadrant tenderness to palpation without rebound or guarding). There is no rebound and no guarding.  Musculoskeletal: Normal range of motion. She exhibits no edema and no tenderness.  No CVA tenderness bilaterally.  Neurological: She is alert and oriented to person, place, and time.  Moves all extremities without deficit. Sensation grossly intact.  Skin: Skin is warm and dry. No rash noted. No erythema.  Psychiatric:  Histrionic behavior.    ED Course  Procedures (including critical care time)  DIAGNOSTIC STUDIES: Oxygen Saturation is 100% on RA, normal by my interpretation.    COORDINATION OF CARE: 12:53 AM: Will order CBC with diff, CMP, blood lipase, UA. Will also order Dilaudid, Zofran and IV fluids to help with symptoms. Patient informed of current plan for treatment and evaluation and agrees with plan at this time.     Labs Review Labs Reviewed  CBC WITH DIFFERENTIAL - Abnormal;  Notable for the following:    WBC 12.1 (*)    Neutro Abs 8.2 (*)    All other components within normal limits  COMPREHENSIVE METABOLIC PANEL - Abnormal; Notable for the following:    Glucose, Bld 108 (*)    Albumin 3.4 (*)    Total Bilirubin 0.1 (*)    All other components within normal limits  URINALYSIS, ROUTINE W REFLEX MICROSCOPIC - Abnormal; Notable for the following:    Specific Gravity, Urine >1.030 (*)    All other components within normal limits  LIPASE, BLOOD    Imaging Review Ct Abdomen Pelvis W Contrast  09/13/2013   CLINICAL DATA:  Right-sided flank pain with nausea and vomiting.  EXAM: CT ABDOMEN AND PELVIS WITH CONTRAST  TECHNIQUE: Multidetector CT imaging of the abdomen and pelvis was performed using the standard protocol following bolus administration of intravenous contrast.  CONTRAST:  50mL OMNIPAQUE IOHEXOL 300 MG/ML SOLN, 100mL OMNIPAQUE IOHEXOL 300  MG/ML SOLN  COMPARISON:  04/26/2013  FINDINGS: Lung bases are clear. Contrast material is present in the lower esophagus suggesting reflux or dysmotility. Surgical absence of the gallbladder. The liver, spleen, pancreas, adrenal glands, kidneys, abdominal aorta, inferior vena cava, and retroperitoneal lymph nodes are unremarkable. Small accessory spleen. The stomach, small bowel, and colon are not abnormally distended. No free air or free fluid in the abdomen. Abdominal wall musculature appears intact.  Pelvis: The appendix is normal. No evidence of diverticulitis. No pelvic mass or lymphadenopathy. No free or loculated pelvic fluid collections. No destructive bone lesions.  IMPRESSION: No acute process demonstrated in the abdomen or pelvis.   Electronically Signed   By: Burman NievesWilliam  Stevens M.D.   On: 09/13/2013 05:13     EKG Interpretation None      MDM   Final diagnoses:  Chronic abdominal pain    I personally performed the services described in this documentation, which was scribed in my presence. The recorded information has been reviewed and is accurate.   Take in addition continues to cry out in pain despite multiple doses of Dilaudid. She seems especially agitated with others in the room. Suspect there is some psychiatric component the patient's presentation. However, with persistent pain in the right lower quadrants get CT abdomen to rule out any acute pathology  Patient's CT without any acute findings. She is continued to request more doses of Dilaudid and is asking to be discharged home with narcotics. I expressed concern that she may have dependency issues on narcotics given her remarkable tolerance for high doses of Dilaudid and behavior. Patient became very irate demanding to be discharged immediately. When her discharge paperwork was presented to her the patient threw it using multiple expletives. She appeared in no distress as she was walking from her bed to the front of the  door.  Encouraged the patient to followup with her gastroenterologist for her ongoing chronic abdominal pain. Return precautions have been given.   Loren Raceravid Mikhai Bienvenue, MD 09/13/13 [id number]

## 2013-09-13 NOTE — ED Notes (Signed)
Patient refused discharge papers. Removed her own IV, left hollering at Nursing staff and MD.

## 2013-09-13 NOTE — ED Notes (Signed)
MD notified of patient's pain at this time.

## 2013-09-13 NOTE — ED Notes (Signed)
Patient ambulated to bathroom, she was walking bent over and said it was painful to walk.

## 2013-09-13 NOTE — Discharge Instructions (Signed)
Abdominal Pain, Women °Abdominal (stomach, pelvic, or belly) pain can be caused by many things. It is important to tell your doctor: °· The location of the pain. °· Does it come and go or is it present all the time? °· Are there things that start the pain (eating certain foods, exercise)? °· Are there other symptoms associated with the pain (fever, nausea, vomiting, diarrhea)? °All of this is helpful to know when trying to find the cause of the pain. °CAUSES  °· Stomach: virus or bacteria infection, or ulcer. °· Intestine: appendicitis (inflamed appendix), regional ileitis (Crohn's disease), ulcerative colitis (inflamed colon), irritable bowel syndrome, diverticulitis (inflamed diverticulum of the colon), or cancer of the stomach or intestine. °· Gallbladder disease or stones in the gallbladder. °· Kidney disease, kidney stones, or infection. °· Pancreas infection or cancer. °· Fibromyalgia (pain disorder). °· Diseases of the female organs: °· Uterus: fibroid (non-cancerous) tumors or infection. °· Fallopian tubes: infection or tubal pregnancy. °· Ovary: cysts or tumors. °· Pelvic adhesions (scar tissue). °· Endometriosis (uterus lining tissue growing in the pelvis and on the pelvic organs). °· Pelvic congestion syndrome (female organs filling up with blood just before the menstrual period). °· Pain with the menstrual period. °· Pain with ovulation (producing an egg). °· Pain with an IUD (intrauterine device, birth control) in the uterus. °· Cancer of the female organs. °· Functional pain (pain not caused by a disease, may improve without treatment). °· Psychological pain. °· Depression. °DIAGNOSIS  °Your doctor will decide the seriousness of your pain by doing an examination. °· Blood tests. °· X-rays. °· Ultrasound. °· CT scan (computed tomography, special type of X-ray). °· MRI (magnetic resonance imaging). °· Cultures, for infection. °· Barium enema (dye inserted in the large intestine, to better view it with  X-rays). °· Colonoscopy (looking in intestine with a lighted tube). °· Laparoscopy (minor surgery, looking in abdomen with a lighted tube). °· Major abdominal exploratory surgery (looking in abdomen with a large incision). °TREATMENT  °The treatment will depend on the cause of the pain.  °· Many cases can be observed and treated at home. °· Over-the-counter medicines recommended by your caregiver. °· Prescription medicine. °· Antibiotics, for infection. °· Birth control pills, for painful periods or for ovulation pain. °· Hormone treatment, for endometriosis. °· Nerve blocking injections. °· Physical therapy. °· Antidepressants. °· Counseling with a psychologist or psychiatrist. °· Minor or major surgery. °HOME CARE INSTRUCTIONS  °· Do not take laxatives, unless directed by your caregiver. °· Take over-the-counter pain medicine only if ordered by your caregiver. Do not take aspirin because it can cause an upset stomach or bleeding. °· Try a clear liquid diet (broth or water) as ordered by your caregiver. Slowly move to a bland diet, as tolerated, if the pain is related to the stomach or intestine. °· Have a thermometer and take your temperature several times a day, and record it. °· Bed rest and sleep, if it helps the pain. °· Avoid sexual intercourse, if it causes pain. °· Avoid stressful situations. °· Keep your follow-up appointments and tests, as your caregiver orders. °· If the pain does not go away with medicine or surgery, you may try: °· Acupuncture. °· Relaxation exercises (yoga, meditation). °· Group therapy. °· Counseling. °SEEK MEDICAL CARE IF:  °· You notice certain foods cause stomach pain. °· Your home care treatment is not helping your pain. °· You need stronger pain medicine. °· You want your IUD removed. °· You feel faint or   lightheaded. °· You develop nausea and vomiting. °· You develop a rash. °· You are having side effects or an allergy to your medicine. °SEEK IMMEDIATE MEDICAL CARE IF:  °· Your  pain does not go away or gets worse. °· You have a fever. °· Your pain is felt only in portions of the abdomen. The right side could possibly be appendicitis. The left lower portion of the abdomen could be colitis or diverticulitis. °· You are passing blood in your stools (bright red or black tarry stools, with or without vomiting). °· You have blood in your urine. °· You develop chills, with or without a fever. °· You pass out. °MAKE SURE YOU:  °· Understand these instructions. °· Will watch your condition. °· Will get help right away if you are not doing well or get worse. °Document Released: 03/11/2007 Document Revised: 08/06/2011 Document Reviewed: 03/31/2009 °Exit[employer]® Patient Information ©2014 [employer]. ° °

## 2013-12-18 ENCOUNTER — Encounter: Payer: Self-pay | Admitting: Cardiovascular Disease

## 2013-12-19 ENCOUNTER — Encounter: Payer: Self-pay | Admitting: Cardiovascular Disease

## 2014-03-30 ENCOUNTER — Encounter: Payer: Self-pay | Admitting: Cardiovascular Disease

## 2014-04-05 ENCOUNTER — Ambulatory Visit (INDEPENDENT_AMBULATORY_CARE_PROVIDER_SITE_OTHER): Payer: Medicaid Other | Admitting: Cardiovascular Disease

## 2014-04-05 ENCOUNTER — Ambulatory Visit: Payer: Medicaid Other | Admitting: Cardiovascular Disease

## 2014-04-05 ENCOUNTER — Encounter: Payer: Self-pay | Admitting: Cardiovascular Disease

## 2014-04-05 VITALS — BP 103/73 | HR 89 | Ht 65.0 in | Wt 241.0 lb

## 2014-04-05 DIAGNOSIS — I493 Ventricular premature depolarization: Secondary | ICD-10-CM | POA: Diagnosis not present

## 2014-04-05 DIAGNOSIS — K219 Gastro-esophageal reflux disease without esophagitis: Secondary | ICD-10-CM | POA: Diagnosis not present

## 2014-04-05 DIAGNOSIS — R0789 Other chest pain: Secondary | ICD-10-CM

## 2014-04-05 DIAGNOSIS — R002 Palpitations: Secondary | ICD-10-CM

## 2014-04-05 DIAGNOSIS — I517 Cardiomegaly: Secondary | ICD-10-CM

## 2014-04-05 MED ORDER — OMEPRAZOLE 20 MG PO CPDR: 20.0000 mg | DELAYED_RELEASE_CAPSULE | Freq: Every day | ORAL | Status: DC

## 2014-04-05 NOTE — Patient Instructions (Signed)
   Stop Zantac  Begin Omeprazole 20mg  daily - new sent to pharm  If palpitations get worse, may consider a 48 hour holter monitor.   Continue all other medications.   Your physician wants you to follow up in: 6 months.  You will receive a reminder letter in the mail one-two months in advance.  If you don't receive a letter, please call our office to schedule the follow up appointment

## 2014-04-05 NOTE — Progress Notes (Signed)
Patient ID: Teresa [name], female   DOB: Apr 15, 1986, 28 y.o.   MRN: [id number]      SUBJECTIVE: The patient is a 28 year old woman who was most recently evaluated by K. Lawrence NP in 2013. She has a history of severe GERD, morbid obesity, anxiety, syncope, tobacco abuse, and chest pressure with palpitations due to anxiety. At her last office visit, she requested Xanax which was declined. She underwent a normal echocardiogram on 10/27/2010. There was normal left ventricular systolic function, LVEF 55-60%. She was evaluated in the ED for abdominal pain in April 2015 and no acute abnormalities were found. She reportedly requested Dilaudid several times in the ER and then requested a prescription for narcotics. Chest xray on 03/30/14 demonstrated "borderline cardiomegaly without failure". ECG performed in the office today demonstrates normal sinus rhythm with a nonspecific T wave abnormality, heart rate 89 bpm.  She tells me that she was told that her heart was "severely enlarged" and "needs to see a cardiologist right away". She complains of palpitations on a daily basis which have previously been evaluated by Dr. Kirke CorinArida. She said it is made worse with anxiety. She takes over-the-counter ranitidine. Her chest pain has been alleviated with a GI cocktail in the recent past.    Review of Systems: As per "subjective", otherwise negative.  Allergies  Allergen Reactions  . Betadine [Povidone Iodine] Hives  . Ceclor [Cefaclor] Hives  . Hydrocodone Itching  . Morphine And Related     PVCs and "it stops my heart" per patient.  . Adhesive [Tape] Rash  . Latex Rash    Current Outpatient Prescriptions  Medication Sig Dispense Refill  . HYDROcodone-acetaminophen (NORCO/VICODIN) 5-325 MG per tablet Take 1 tablet by mouth every 4 (four) hours as needed for moderate pain. 15 tablet 0  . ibuprofen (ADVIL,MOTRIN) 600 MG tablet Take 1 tablet (600 mg total) by mouth every 6 (six) hours as needed for pain.  30 tablet 0  . levothyroxine (SYNTHROID, LEVOTHROID) 75 MCG tablet Take 75 mcg by mouth daily.    . ondansetron (ZOFRAN ODT) 4 MG disintegrating tablet 4mg  ODT q4 hours prn nausea/vomit 8 tablet 0  . ondansetron (ZOFRAN ODT) 8 MG disintegrating tablet Take 1 tablet (8 mg total) by mouth every 8 (eight) hours as needed for nausea or vomiting. 10 tablet 0  . oxyCODONE-acetaminophen (PERCOCET/ROXICET) 5-325 MG per tablet Take 1 tablet by mouth every 4 (four) hours as needed for pain. 15 tablet 0  . [DISCONTINUED] ranitidine (ZANTAC) 150 MG tablet Take 150 mg by mouth 2 (two) times daily.     No current facility-administered medications for this visit.    Past Medical History  Diagnosis Date  . Asthma   . GERD (gastroesophageal reflux disease)   . Hiatal hernia   . Tobacco abuse   . PVC (premature ventricular contraction)   . Hypothyroidism     Past Surgical History  Procedure Laterality Date  . Cholecystectomy  2007  . Cesarean section  2007 and 2011  . Esophagogastroduodenoscopy  2008    incomplete due to inadequate conscious sedation, circumferential distal esophageal erosions, hiatal hernia  . Tubal ligation  2011  . Maloney dilation  07/26/2011    Procedure: Elease HashimotoMALONEY DILATION;  Surgeon: Corbin Adeobert M Rourk, MD;  Location: [facility];  Service: Endoscopy;  Laterality: N/A;  [address] [place]  . Esophageal biopsy  07/26/2011    WUJ:WJXBJYRMR:Distal esophageal erosions consistent with erosive reflux esophagitis.  Patulous EG junction, status post passage of a Maloney dilator (  empirically), status post esophageal biopsy  followed dilation/ A 3-4 cm hiatal hernia, otherwise normal stomach, D1, D2.  . Abdominal hysterectomy  08/15/2011    Procedure: HYSTERECTOMY ABDOMINAL;  Surgeon: Lazaro ArmsLuther H Eure, MD;  Location: [facility];  Service: Gynecology;  Laterality: N/A;  . Salpingoophorectomy  08/15/2011    Procedure: SALPINGO OOPHERECTOMY;  Surgeon: Lazaro ArmsLuther H Eure, MD;  Location: [facility];  Service: Gynecology;  Laterality:  Bilateral;    History   Social History  . Marital Status: Married    Spouse Name: N/A    Number of Children: 3  . Years of Education: N/A   Occupational History  .     Social History Main Topics  . Smoking status: Current Every Day Smoker -- 0.30 packs/day for 4 years    Types: Cigarettes  . Smokeless tobacco: Not on file  . Alcohol Use: No  . Drug Use: No  . Sexual Activity: Yes    Birth Control/ Protection: None   Other Topics Concern  . Not on file   Social History Narrative   Pt is doing online classes.    BP 103/73  Pulse 89 SpO2 98% Weight 241 lb (109.317 kg) Height 5\' 5"  (1.651 m)    PHYSICAL EXAM General: NAD HEENT: Normal. Neck: No JVD, no thyromegaly. Lungs: Clear to auscultation bilaterally with normal respiratory effort. CV: Nondisplaced PMI.  Regular rate and rhythm, normal S1/S2, no S3/S4, no murmur. No pretibial or periankle edema.  No carotid bruit.  Normal pedal pulses.  Abdomen: Soft, nontender, no hepatosplenomegaly, no distention.  Neurologic: Alert and oriented x 3.  Psych: Normal affect. Skin: Normal. Musculoskeletal: Normal range of motion, no gross deformities. Extremities: No clubbing or cyanosis.   ECG: Most recent ECG reviewed.      ASSESSMENT AND PLAN: 1. Borderline cardiomegaly: Normal echocardiogram two years ago. No evidence of heart failure. I suspect the cardiac size was overestimated. I do not see a clear indication to repeat cardiac testing. 2. Palpitations: She complained of these during her ECG which demonstrated normal sinus rhythm, and no PVC's. If they continue to recur, I may consider Holter monitoring. 3. Chest pressure: Given that it was alleviated with a GI cocktail, this points to a esophageal etiology rather than a cardiac one. I will have her stop ranitidine and start omeprazole 20 mg daily. 4. Leg/ankle swelling: I advised her on a low-sodium diet.  Dispo: f/u 6 months.  Prentice DockerSuresh Pamelyn Bancroft, M.D., F.A.C.C.

## 2015-04-22 ENCOUNTER — Ambulatory Visit: Payer: Self-pay | Admitting: Pediatrics

## 2016-06-08 ENCOUNTER — Inpatient Hospital Stay (HOSPITAL_COMMUNITY): Payer: Medicaid Other

## 2016-06-08 ENCOUNTER — Encounter (HOSPITAL_COMMUNITY): Payer: Self-pay

## 2016-06-08 ENCOUNTER — Inpatient Hospital Stay (HOSPITAL_COMMUNITY)
Admission: AD | Admit: 2016-06-08 | Discharge: 2016-06-08 | Disposition: A | Discharge status: Home / Self Care | Payer: Medicaid Other | Source: Ambulatory Visit | Attending: Obstetrics & Gynecology | Admitting: Obstetrics & Gynecology

## 2016-06-08 DIAGNOSIS — J45909 Unspecified asthma, uncomplicated: Secondary | ICD-10-CM | POA: Diagnosis not present

## 2016-06-08 DIAGNOSIS — K449 Diaphragmatic hernia without obstruction or gangrene: Secondary | ICD-10-CM | POA: Diagnosis not present

## 2016-06-08 DIAGNOSIS — K219 Gastro-esophageal reflux disease without esophagitis: Secondary | ICD-10-CM | POA: Insufficient documentation

## 2016-06-08 DIAGNOSIS — E039 Hypothyroidism, unspecified: Secondary | ICD-10-CM | POA: Insufficient documentation

## 2016-06-08 DIAGNOSIS — N939 Abnormal uterine and vaginal bleeding, unspecified: Secondary | ICD-10-CM | POA: Diagnosis not present

## 2016-06-08 DIAGNOSIS — Z90710 Acquired absence of both cervix and uterus: Secondary | ICD-10-CM | POA: Insufficient documentation

## 2016-06-08 DIAGNOSIS — R310 Gross hematuria: Secondary | ICD-10-CM | POA: Diagnosis not present

## 2016-06-08 DIAGNOSIS — R319 Hematuria, unspecified: Secondary | ICD-10-CM

## 2016-06-08 DIAGNOSIS — F17210 Nicotine dependence, cigarettes, uncomplicated: Secondary | ICD-10-CM | POA: Insufficient documentation

## 2016-06-08 DIAGNOSIS — R102 Pelvic and perineal pain: Secondary | ICD-10-CM

## 2016-06-08 LAB — CBC WITH DIFFERENTIAL/PLATELET
BASOS ABS: 0 10*3/uL (ref 0.0–0.1)
Basophils Relative: 1 %
Eosinophils Absolute: 0.4 10*3/uL (ref 0.0–0.7)
Eosinophils Relative: 4 %
HCT: 39.4 % (ref 36.0–46.0)
Hemoglobin: 12.5 g/dL (ref 12.0–15.0)
Lymphocytes Relative: 44 %
Lymphs Abs: 3.5 10*3/uL (ref 0.7–4.0)
MCH: 27.8 pg (ref 26.0–34.0)
MCHC: 31.7 g/dL (ref 30.0–36.0)
MCV: 87.8 fL (ref 78.0–100.0)
MONO ABS: 0.3 10*3/uL (ref 0.1–1.0)
Monocytes Relative: 4 %
Neutro Abs: 3.8 10*3/uL (ref 1.7–7.7)
Neutrophils Relative %: 47 %
Platelets: 264 10*3/uL (ref 150–400)
RBC: 4.49 MIL/uL (ref 3.87–5.11)
RDW: 13.3 % (ref 11.5–15.5)
WBC: 8 10*3/uL (ref 4.0–10.5)

## 2016-06-08 LAB — URINALYSIS, ROUTINE W REFLEX MICROSCOPIC
Bilirubin Urine: NEGATIVE
Glucose, UA: NEGATIVE mg/dL
Ketones, ur: NEGATIVE mg/dL
Leukocytes, UA: NEGATIVE
Nitrite: NEGATIVE
PROTEIN: NEGATIVE mg/dL
Specific Gravity, Urine: 1.013 (ref 1.005–1.030)
pH: 8 (ref 5.0–8.0)

## 2016-06-08 LAB — WET PREP, GENITAL
Clue Cells Wet Prep HPF POC: NONE SEEN
Sperm: NONE SEEN
Trich, Wet Prep: NONE SEEN
Yeast Wet Prep HPF POC: NONE SEEN

## 2016-06-08 MED ORDER — KETOROLAC TROMETHAMINE 60 MG/2ML IM SOLN
60.0000 mg | Freq: Once | INTRAMUSCULAR | Status: AC
  Administered 2016-06-08: 60 mg via INTRAMUSCULAR
  Filled 2016-06-08: qty 2

## 2016-06-08 MED ORDER — OXYCODONE-ACETAMINOPHEN 5-325 MG PO TABS: 1.0000 | ORAL_TABLET | Freq: Four times a day (QID) | ORAL | 0 refills | Status: DC | PRN

## 2016-06-08 NOTE — Discharge Instructions (Signed)

## 2016-06-08 NOTE — MAU Note (Signed)
Went to the hosp [facility]([place]) this past Monday, for a bladder and kidney infection. Was bleeding then.  Was told the blood was coming from her bladder. Still bleeding, it is not coming from her bladder and she has had a total hysterectomy.  Says it is almost like a period, not wearing pads, going to the bathroom' like every 5 min'.  Getting weak, still hurting.  Was put on Cipro. +CVA tenderness.

## 2016-06-08 NOTE — MAU Provider Note (Signed)
History     CSN: [id number]  Arrival date and time: 06/08/16 1757   First Provider Initiated Contact with Patient 06/08/16 1840      Chief Complaint  Patient presents with  . Vaginal Bleeding   HPI Ms. Teresa Dareiffany D [name] is a 31 y.o. G2P2000 who presents to MAU today with complaint of possible vaginal bleeding and abdominal pain. The patient states bleeding noted first on Monday. [facility] and was diagnosed with a UTI and given Cipro. She states increased bleeding noted since then. She denies UTI symptoms. She feels that bleeding may be vaginal. She had a hysterectomy in 2013. She denies fever or UTI symptoms.    OB History    Gravida Para Term Preterm AB Living   2 2 2          SAB TAB Ectopic Multiple Live Births           2      Past Medical History:  Diagnosis Date  . Asthma   . GERD (gastroesophageal reflux disease)   . Hiatal hernia   . Hypothyroidism   . PVC (premature ventricular contraction)   . Tobacco abuse     Past Surgical History:  Procedure Laterality Date  . ABDOMINAL HYSTERECTOMY  08/15/2011   Procedure: HYSTERECTOMY ABDOMINAL;  Surgeon: Lazaro ArmsLuther H Eure, MD;  Location: [facility];  Service: Gynecology;  Laterality: N/A;  . BIOPSY  07/26/2011   WUJ:[id number]:Distal esophageal erosions consistent with erosive reflux esophagitis.  Patulous EG junction, status post passage of a Maloney dilator (empirically), status post esophageal biopsy  followed dilation/ A 3-4 cm hiatal hernia, otherwise normal stomach, D1, D2.  Marland Kitchen. CESAREAN SECTION  2007 and 2011  . CHOLECYSTECTOMY  2007  . ESOPHAGOGASTRODUODENOSCOPY  2008   incomplete due to inadequate conscious sedation, circumferential distal esophageal erosions, hiatal hernia  . MALONEY DILATION  07/26/2011   Procedure: Elease HashimotoMALONEY DILATION;  Surgeon: Corbin Adeobert M Rourk, MD;  Location: [facility];  Service: Endoscopy;  Laterality: N/A;  [address] [place] Dil[place]  . SALPINGOOPHORECTOMY  08/15/2011   Procedure: SALPINGO OOPHERECTOMY;   Surgeon: Lazaro ArmsLuther H Eure, MD;  Location: [facility];  Service: Gynecology;  Laterality: Bilateral;  . TUBAL LIGATION  2011    Family History  Problem Relation Age of Onset  . Ulcers Mother     Questionable history  . Liver disease Neg Hx   . Cancer Neg Hx     Colorectal cancer  . Anesthesia problems Neg Hx   . Hypotension Neg Hx   . Malignant hyperthermia Neg Hx   . Pseudochol deficiency Neg Hx     Social History  Substance Use Topics  . Smoking status: Current Every Day Smoker    Packs/day: 0.50    Years: 4.00    Types: Cigarettes    Start date: 07/06/2005  . Smokeless tobacco: Never Used  . Alcohol use No    Allergies:  Allergies  Allergen Reactions  . Betadine [Povidone Iodine] Hives  . Ceclor [Cefaclor] Hives  . Hydrocodone Itching  . Morphine And Related     PVCs and "it stops my heart" per patient.  . Adhesive [Tape] Rash  . Latex Rash    Prescriptions Prior to Admission  Medication Sig Dispense Refill Last Dose  . buPROPion (WELLBUTRIN XL) 150 MG 24 hr tablet Take 150 mg by mouth daily.   06/07/2016 at Unknown time  . ciprofloxacin (CIPRO) 500 MG tablet Take 500 mg by mouth 2 (two) times daily.  06/08/2016 at Unknown time  . ibuprofen (ADVIL,MOTRIN) 600 MG tablet Take 1 tablet (600 mg total) by mouth every 6 (six) hours as needed for pain. (Patient not taking: Reported on 06/08/2016) 30 tablet 0 Not Taking at Unknown time  . omeprazole (PRILOSEC) 20 MG capsule Take 1 capsule (20 mg total) by mouth daily. (Patient not taking: Reported on 06/08/2016) 30 capsule 6 Not Taking at Unknown time  . ondansetron (ZOFRAN ODT) 4 MG disintegrating tablet 4mg  ODT q4 hours prn nausea/vomit (Patient not taking: Reported on 06/08/2016) 8 tablet 0 Not Taking at Unknown time  . ondansetron (ZOFRAN ODT) 8 MG disintegrating tablet Take 1 tablet (8 mg total) by mouth every 8 (eight) hours as needed for nausea or vomiting. (Patient not taking: Reported on 06/08/2016) 10 tablet 0 Not Taking at  Unknown time  . [DISCONTINUED] oxyCODONE-acetaminophen (PERCOCET/ROXICET) 5-325 MG per tablet Take 1 tablet by mouth every 4 (four) hours as needed for pain. (Patient not taking: Reported on 06/08/2016) 15 tablet 0 Not Taking at Unknown time    Review of Systems  Constitutional: Negative for fever.  Gastrointestinal: Positive for abdominal pain. Negative for constipation, diarrhea, nausea and vomiting.  Genitourinary: Positive for hematuria. Negative for dysuria, frequency, urgency, vaginal bleeding and vaginal discharge.   Physical Exam   Blood pressure 123/94, pulse 94, temperature 98.1 F (36.7 C), temperature source Oral, resp. rate 18, weight 245 lb 4 oz (111.2 kg), last menstrual period 05/28/2009, SpO2 97 %.  Physical Exam  Nursing note and vitals reviewed. Constitutional: She is oriented to person, place, and time. She appears well-developed and well-nourished. No distress.  HENT:  Head: Normocephalic and atraumatic.  Cardiovascular: Normal rate.   Respiratory: Effort normal.  GI: Soft. She exhibits no distension and no mass. There is tenderness (mild diffuse tenderness to palpation of the abdomen). There is no rebound, no guarding and no CVA tenderness.  Genitourinary: No bleeding in the vagina. Vaginal discharge (scant thin, white discharge noted ) found.  Neurological: She is alert and oriented to person, place, and time.  Skin: Skin is warm and dry. No erythema.  Psychiatric: She has a normal mood and affect.     Results for orders placed or performed during the hospital encounter of 06/08/16 (from the past 24 hour(s))  Urinalysis, Routine w reflex microscopic     Status: Abnormal   Collection Time: 06/08/16  6:15 PM  Result Value Ref Range   Color, Urine STRAW (A) YELLOW   APPearance CLEAR CLEAR   Specific Gravity, Urine 1.013 1.005 - 1.030   pH 8.0 5.0 - 8.0   Glucose, UA NEGATIVE NEGATIVE mg/dL   Hgb urine dipstick MODERATE (A) NEGATIVE   Bilirubin Urine NEGATIVE  NEGATIVE   Ketones, ur NEGATIVE NEGATIVE mg/dL   Protein, ur NEGATIVE NEGATIVE mg/dL   Nitrite NEGATIVE NEGATIVE   Leukocytes, UA NEGATIVE NEGATIVE   RBC / HPF TOO NUMEROUS TO COUNT 0 - 5 RBC/hpf   WBC, UA 0-5 0 - 5 WBC/hpf   Bacteria, UA RARE (A) NONE SEEN   Squamous Epithelial / LPF 0-5 (A) NONE SEEN   Mucous PRESENT   CBC with Differential/Platelet     Status: None   Collection Time: 06/08/16  6:38 PM  Result Value Ref Range   WBC 8.0 4.0 - 10.5 K/uL   RBC 4.49 3.87 - 5.11 MIL/uL   Hemoglobin 12.5 12.0 - 15.0 g/dL   HCT 16.1 09.6 - 04.5 %   MCV 87.8 78.0 - 100.0 fL  MCH 27.8 26.0 - 34.0 pg   MCHC 31.7 30.0 - 36.0 g/dL   RDW 82.9 56.2 - 13.0 %   Platelets 264 150 - 400 K/uL   Neutrophils Relative % 47 %   Neutro Abs 3.8 1.7 - 7.7 K/uL   Lymphocytes Relative 44 %   Lymphs Abs 3.5 0.7 - 4.0 K/uL   Monocytes Relative 4 %   Monocytes Absolute 0.3 0.1 - 1.0 K/uL   Eosinophils Relative 4 %   Eosinophils Absolute 0.4 0.0 - 0.7 K/uL   Basophils Relative 1 %   Basophils Absolute 0.0 0.0 - 0.1 K/uL  Wet prep, genital     Status: Abnormal   Collection Time: 06/08/16  6:56 PM  Result Value Ref Range   Yeast Wet Prep HPF POC NONE SEEN NONE SEEN   Trich, Wet Prep NONE SEEN NONE SEEN   Clue Cells Wet Prep HPF POC NONE SEEN NONE SEEN   WBC, Wet Prep HPF POC FEW (A) NONE SEEN   Sperm NONE SEEN    US Renal  Result Date: 06/08/2016 CLINICAL DATA:  Hematuria. EXAM: RENAL / URINARY TRACT ULTRASOUND COMPLETE COMPARISON:  Stone study of 06/05/2016 FINDINGS: Right Kidney: Length: 9.5 cm. Echogenicity within normal limits. No mass or hydronephrosis visualized. Left Kidney: Length: 10.0 cm. Echogenicity within normal limits. No mass or hydronephrosis visualized. Bladder: Normal urinary bladder.  Bilateral ureteric jets identified. IMPRESSION: Normal renal ultrasound ; no explanation for hematuria. Electronically Signed   By: Jeronimo Greaves M.D.   On: 06/08/2016 20:06    MAU Course   Procedures None   MDM UA, CBC and wet prep today  60 mg Toradol given IM for pain Renal US ordered due to hematuria without evidence of UTI Assessment and Plan  A: Hematuria   P: Discharge home Rx for Percocet given to patient  Warning signs for worsening condition discussed Patient advised to follow-up with PCP next week for further evaluation and possible urology referral Patient may return to MAU as needed or if her condition were to change or worsen  Marny Lowenstein, PA-C  06/08/2016, 8:08 PM

## 2016-06-10 LAB — URINE CULTURE: Culture: NO GROWTH

## 2016-08-13 ENCOUNTER — Emergency Department (HOSPITAL_COMMUNITY): Payer: Medicaid Other

## 2016-08-13 ENCOUNTER — Emergency Department (HOSPITAL_COMMUNITY)
Admission: EM | Admit: 2016-08-13 | Discharge: 2016-08-13 | Disposition: A | Discharge status: Home / Self Care | Payer: Medicaid Other | Attending: Emergency Medicine | Admitting: Emergency Medicine

## 2016-08-13 ENCOUNTER — Encounter (HOSPITAL_COMMUNITY): Payer: Self-pay | Admitting: Emergency Medicine

## 2016-08-13 DIAGNOSIS — E039 Hypothyroidism, unspecified: Secondary | ICD-10-CM | POA: Diagnosis not present

## 2016-08-13 DIAGNOSIS — F17210 Nicotine dependence, cigarettes, uncomplicated: Secondary | ICD-10-CM | POA: Diagnosis not present

## 2016-08-13 DIAGNOSIS — J9801 Acute bronchospasm: Secondary | ICD-10-CM | POA: Insufficient documentation

## 2016-08-13 DIAGNOSIS — J40 Bronchitis, not specified as acute or chronic: Secondary | ICD-10-CM | POA: Insufficient documentation

## 2016-08-13 DIAGNOSIS — Z79899 Other long term (current) drug therapy: Secondary | ICD-10-CM | POA: Diagnosis not present

## 2016-08-13 DIAGNOSIS — R05 Cough: Secondary | ICD-10-CM | POA: Diagnosis present

## 2016-08-13 MED ORDER — PREDNISONE 50 MG PO TABS
60.0000 mg | ORAL_TABLET | Freq: Once | ORAL | Status: AC
  Administered 2016-08-13: 60 mg via ORAL
  Filled 2016-08-13: qty 1

## 2016-08-13 MED ORDER — IPRATROPIUM-ALBUTEROL 0.5-2.5 (3) MG/3ML IN SOLN
3.0000 mL | Freq: Once | RESPIRATORY_TRACT | Status: AC
  Administered 2016-08-13: 3 mL via RESPIRATORY_TRACT
  Filled 2016-08-13: qty 3

## 2016-08-13 MED ORDER — ALBUTEROL SULFATE (2.5 MG/3ML) 0.083% IN NEBU
2.5000 mg | INHALATION_SOLUTION | Freq: Once | RESPIRATORY_TRACT | Status: AC
  Administered 2016-08-13: 2.5 mg via RESPIRATORY_TRACT
  Filled 2016-08-13: qty 3

## 2016-08-13 MED ORDER — PREDNISONE 10 MG PO TABS: 20.0000 mg | ORAL_TABLET | Freq: Every day | ORAL | 0 refills | Status: DC

## 2016-08-13 MED ORDER — AZITHROMYCIN 250 MG PO TABS: ORAL_TABLET | ORAL | 0 refills | Status: DC

## 2016-08-13 NOTE — ED Notes (Signed)
Pt wants to know what is taking so long, EDP notified.

## 2016-08-13 NOTE — ED Triage Notes (Signed)
Pt reports productive cough since Friday. Pt reports emesis x1. Pt denies any known fever but reports history of same. nad noted.

## 2016-08-13 NOTE — ED Provider Notes (Signed)
[facility] Provider Note   CSN: [id number] Arrival date & time: 08/13/16  4098  By signing my name below, I, Modena Jansky, attest that this documentation has been prepared under the direction and in the presence of Bethann Berkshire, MD. Electronically Signed: Modena Jansky, Scribe. 08/13/2016. 10:00 AM.  History   Chief Complaint Chief Complaint  Patient presents with  . Cough   The history is provided by the patient. No language interpreter was used.  Cough  This is a new problem. The current episode started more than 2 days ago. The problem occurs every few minutes. The problem has not changed since onset.The cough is productive of sputum. There has been no fever. Pertinent negatives include no chest pain, no chills and no headaches. She has tried nothing for the symptoms. She is a smoker. Her past medical history is significant for asthma.   HPI Comments: Teresa [name] is a 31 y.o. female with a PMHx of asthma who presents to the Emergency Department complaining of intermittent moderate cough that started about 3 days ago. She describes the cough as productive of green sputum. She has a nebulizer at home, which she uses only when sick. She reports associated post-tussive vomiting and chest congest. She admits to a hx of smoking and started Wellbutrin yesterday. She denies any sick contacts, fever, chills, or other complaints.   Past Medical History:  Diagnosis Date  . Asthma   . GERD (gastroesophageal reflux disease)   . Hiatal hernia   . Hypothyroidism   . PVC (premature ventricular contraction)   . Tobacco abuse     Patient Active Problem List   Diagnosis Date Noted  . Esophageal dysphagia 07/04/2011  . Rectal bleed 07/04/2011  . Syncopal episodes 11/24/2010  . Tobacco abuse   . PVC (premature ventricular contraction)   . GERD, SEVERE 02/08/2009  . EPIGASTRIC PAIN 02/08/2009    Past Surgical History:  Procedure Laterality Date  . ABDOMINAL HYSTERECTOMY   08/15/2011   Procedure: HYSTERECTOMY ABDOMINAL;  Surgeon: Lazaro Arms, MD;  Location: [facility];  Service: Gynecology;  Laterality: N/A;  . BIOPSY  07/26/2011   JXB:JYNWGN esophageal erosions consistent with erosive reflux esophagitis.  Patulous EG junction, status post passage of a Maloney dilator (empirically), status post esophageal biopsy  followed dilation/ A 3-4 cm hiatal hernia, otherwise normal stomach, D1, D2.  Marland Kitchen CESAREAN SECTION  2007 and 2011  . CHOLECYSTECTOMY  2007  . ESOPHAGOGASTRODUODENOSCOPY  2008   incomplete due to inadequate conscious sedation, circumferential distal esophageal erosions, hiatal hernia  . MALONEY DILATION  07/26/2011   Procedure: [name] DILATION;  Surgeon: Corbin Ade, MD;  Location: [facility];  Service: Endoscopy;  Laterality: N/A;  [address] [place]  . SALPINGOOPHORECTOMY  08/15/2011   Procedure: SALPINGO OOPHERECTOMY;  Surgeon: Lazaro Arms, MD;  Location: [facility];  Service: Gynecology;  Laterality: Bilateral;  . TUBAL LIGATION  2011    OB History    Gravida Para Term Preterm AB Living   2 2 2          SAB TAB Ectopic Multiple Live Births           2       Home Medications    Prior to Admission medications   Medication Sig Start Date End Date Taking? Authorizing Provider  buPROPion (WELLBUTRIN XL) 150 MG 24 hr tablet Take 150 mg by mouth daily.    Historical Provider, MD  ciprofloxacin (CIPRO) 500 MG tablet Take 500 mg by  mouth 2 (two) times daily. 06/04/16   Historical Provider, MD  oxyCODONE-acetaminophen (PERCOCET/ROXICET) 5-325 MG tablet Take 1-2 tablets by mouth every 6 (six) hours as needed. 06/08/16   Marny LowensteinJulie N Wenzel, PA-C    Family History Family History  Problem Relation Age of Onset  . Ulcers Mother     Questionable history  . Liver disease Neg Hx   . Cancer Neg Hx     Colorectal cancer  . Anesthesia problems Neg Hx   . Hypotension Neg Hx   . Malignant hyperthermia Neg Hx   . Pseudochol deficiency Neg Hx     Social History Social  History  Substance Use Topics  . Smoking status: Current Every Day Smoker    Packs/day: 0.50    Years: 4.00    Types: Cigarettes    Start date: 07/06/2005  . Smokeless tobacco: Never Used  . Alcohol use No     Allergies   Betadine [povidone iodine]; Ceclor [cefaclor]; Hydrocodone; Morphine and related; Adhesive [tape]; and Latex   Review of Systems Review of Systems  Constitutional: Negative for appetite change, chills, fatigue and fever.  HENT: Negative for congestion, ear discharge and sinus pressure.   Eyes: Negative for discharge.  Respiratory: Positive for cough (Productive).   Cardiovascular: Negative for chest pain.  Gastrointestinal: Positive for vomiting (Post-tussive). Negative for abdominal pain and diarrhea.  Genitourinary: Negative for frequency and hematuria.  Musculoskeletal: Negative for back pain.  Skin: Negative for rash.  Neurological: Negative for seizures and headaches.  Psychiatric/Behavioral: Negative for hallucinations.     Physical Exam Updated Vital Signs BP (!) 153/97 (BP Location: Left Arm)   Pulse 87   Temp 97.6 F (36.4 C) (Oral)   Resp 18   Ht 5\' 5"  (1.651 m)   Wt 220 lb (99.8 kg)   LMP 06/29/2011   SpO2 97%   BMI 36.61 kg/m   Physical Exam  Constitutional: She is oriented to person, place, and time. She appears well-developed.  HENT:  Head: Normocephalic.  Eyes: Conjunctivae and EOM are normal. No scleral icterus.  Neck: Neck supple. No thyromegaly present.  Cardiovascular: Normal rate and regular rhythm.  Exam reveals no gallop and no friction rub.   No murmur heard. Pulmonary/Chest: No stridor. She has wheezes. She has no rales. She exhibits no tenderness.  Minimal wheezing bilaterally.   Abdominal: She exhibits no distension. There is no tenderness. There is no rebound.  Musculoskeletal: Normal range of motion. She exhibits no edema.  Lymphadenopathy:    She has no cervical adenopathy.  Neurological: She is oriented to  person, place, and time. She exhibits normal muscle tone. Coordination normal.  Skin: No rash noted. No erythema.  Psychiatric: She has a normal mood and affect. Her behavior is normal.     ED Treatments / Results  DIAGNOSTIC STUDIES: Oxygen Saturation is 97% on RA, normal by my interpretation.    COORDINATION OF CARE: 10:04 AM- Pt advised of plan for treatment and pt agrees.  Labs (all labs ordered are listed, but only abnormal results are displayed) Labs Reviewed - No data to display  EKG  EKG Interpretation None       Radiology No results found.  Procedures Procedures (including critical care time)  Medications Ordered in ED Medications  albuterol (PROVENTIL) (2.5 MG/3ML) 0.083% nebulizer solution 2.5 mg (not administered)  ipratropium-albuterol (DUONEB) 0.5-2.5 (3) MG/3ML nebulizer solution 3 mL (not administered)  predniSONE (DELTASONE) tablet 60 mg (not administered)     Initial Impression /  Assessment and Plan / ED Course  I have reviewed the triage vital signs and the nursing notes.  Pertinent labs & imaging results that were available during my care of the patient were reviewed by me and considered in my medical decision making (see chart for details).     Patient with bronchitis and bronchospasm. She will be sent home with Z-Pak prednisone and will continue her albuterol nebs  Final Clinical Impressions(s) / ED Diagnoses   Final diagnoses:  None    New Prescriptions New Prescriptions   No medications on file  The chart was scribed for me under my direct supervision.  I personally performed the history, physical, and medical decision making and all procedures in the evaluation of this patient.Bethann Berkshire, MD 08/13/16 1314

## 2016-08-13 NOTE — Discharge Instructions (Signed)
Follow-up with your doctor in 3-4 days for recheck. Use your nebulized machine every 4-6 hours as needed

## 2016-08-28 ENCOUNTER — Encounter (HOSPITAL_COMMUNITY): Payer: Self-pay | Admitting: Emergency Medicine

## 2016-08-28 ENCOUNTER — Emergency Department (HOSPITAL_COMMUNITY): Payer: Medicaid Other

## 2016-08-28 ENCOUNTER — Emergency Department (HOSPITAL_COMMUNITY)
Admission: EM | Admit: 2016-08-28 | Discharge: 2016-08-28 | Disposition: A | Discharge status: Home / Self Care | Payer: Medicaid Other | Attending: Dermatology | Admitting: Dermatology

## 2016-08-28 DIAGNOSIS — Z79899 Other long term (current) drug therapy: Secondary | ICD-10-CM | POA: Diagnosis not present

## 2016-08-28 DIAGNOSIS — R05 Cough: Secondary | ICD-10-CM | POA: Insufficient documentation

## 2016-08-28 DIAGNOSIS — J45909 Unspecified asthma, uncomplicated: Secondary | ICD-10-CM | POA: Diagnosis not present

## 2016-08-28 DIAGNOSIS — Z5321 Procedure and treatment not carried out due to patient leaving prior to being seen by health care provider: Secondary | ICD-10-CM | POA: Insufficient documentation

## 2016-08-28 DIAGNOSIS — K920 Hematemesis: Secondary | ICD-10-CM | POA: Diagnosis present

## 2016-08-28 DIAGNOSIS — E039 Hypothyroidism, unspecified: Secondary | ICD-10-CM | POA: Insufficient documentation

## 2016-08-28 DIAGNOSIS — F17210 Nicotine dependence, cigarettes, uncomplicated: Secondary | ICD-10-CM | POA: Insufficient documentation

## 2016-08-28 LAB — BASIC METABOLIC PANEL
ANION GAP: 7 (ref 5–15)
BUN: 15 mg/dL (ref 6–20)
CALCIUM: 9 mg/dL (ref 8.9–10.3)
CHLORIDE: 107 mmol/L (ref 101–111)
CO2: 26 mmol/L (ref 22–32)
Creatinine, Ser: 0.85 mg/dL (ref 0.44–1.00)
GFR calc Af Amer: >60 mL/min (ref >60)
GFR calc non Af Amer: >60 mL/min (ref >60)
GLUCOSE: 96 mg/dL (ref 65–99)
Potassium: 4 mmol/L (ref 3.5–5.1)
Sodium: 140 mmol/L (ref 135–145)

## 2016-08-28 LAB — CBC WITH DIFFERENTIAL/PLATELET
Basophils Absolute: 0 10*3/uL (ref 0.0–0.1)
Basophils Relative: 0 %
EOS ABS: 0.3 10*3/uL (ref 0.0–0.7)
EOS PCT: 3 %
HEMATOCRIT: 39.3 % (ref 36.0–46.0)
Hemoglobin: 12.8 g/dL (ref 12.0–15.0)
LYMPHS ABS: 3.8 10*3/uL (ref 0.7–4.0)
Lymphocytes Relative: 40 %
MCH: 29.2 pg (ref 26.0–34.0)
MCHC: 32.6 g/dL (ref 30.0–36.0)
MCV: 89.7 fL (ref 78.0–100.0)
MONO ABS: 0.7 10*3/uL (ref 0.1–1.0)
MONOS PCT: 7 %
Neutro Abs: 4.8 10*3/uL (ref 1.7–7.7)
Neutrophils Relative %: 50 %
Platelets: 241 10*3/uL (ref 150–400)
RBC: 4.38 MIL/uL (ref 3.87–5.11)
RDW: 13.3 % (ref 11.5–15.5)
WBC: 9.6 10*3/uL (ref 4.0–10.5)

## 2016-08-28 LAB — HEPATIC FUNCTION PANEL
ALK PHOS: 82 U/L (ref 38–126)
ALT: 14 U/L (ref 14–54)
AST: 15 U/L (ref 15–41)
Albumin: 3.9 g/dL (ref 3.5–5.0)
Total Bilirubin: 0.2 mg/dL — ABNORMAL LOW (ref 0.3–1.2)
Total Protein: 7.3 g/dL (ref 6.5–8.1)

## 2016-08-28 LAB — LIPASE, BLOOD: Lipase: 25 U/L (ref 11–51)

## 2016-08-28 LAB — D-DIMER, QUANTITATIVE: D-Dimer, Quant: 0.35 ug/mL-FEU (ref 0.00–0.50)

## 2016-08-28 NOTE — ED Notes (Signed)
Per registration to decided to leave facility due to wait time.

## 2016-08-28 NOTE — ED Triage Notes (Signed)
Pt c/o vomiting and coughing up blood since Sunday.

## 2016-08-28 NOTE — ED Triage Notes (Signed)
Patient complaining of "vomiting and coughing up blood" x 2 days.

## 2016-08-29 ENCOUNTER — Emergency Department (HOSPITAL_COMMUNITY)
Admission: EM | Admit: 2016-08-29 | Discharge: 2016-08-29 | Disposition: A | Discharge status: Home / Self Care | Payer: Medicaid Other | Source: Home / Self Care

## 2016-08-29 NOTE — ED Notes (Signed)
Pt's mother informed registration that she was going to [facility]

## 2016-10-29 ENCOUNTER — Emergency Department (HOSPITAL_COMMUNITY)
Admission: EM | Admit: 2016-10-29 | Discharge: 2016-10-29 | Disposition: A | Discharge status: Home / Self Care | Payer: Medicaid Other | Attending: Emergency Medicine | Admitting: Emergency Medicine

## 2016-10-29 ENCOUNTER — Encounter (HOSPITAL_COMMUNITY): Payer: Self-pay | Admitting: Emergency Medicine

## 2016-10-29 DIAGNOSIS — S0921XA Traumatic rupture of right ear drum, initial encounter: Secondary | ICD-10-CM | POA: Diagnosis not present

## 2016-10-29 DIAGNOSIS — Y999 Unspecified external cause status: Secondary | ICD-10-CM | POA: Diagnosis not present

## 2016-10-29 DIAGNOSIS — S09301A Unspecified injury of right middle and inner ear, initial encounter: Secondary | ICD-10-CM | POA: Diagnosis present

## 2016-10-29 DIAGNOSIS — J45909 Unspecified asthma, uncomplicated: Secondary | ICD-10-CM | POA: Diagnosis not present

## 2016-10-29 DIAGNOSIS — F17210 Nicotine dependence, cigarettes, uncomplicated: Secondary | ICD-10-CM | POA: Diagnosis not present

## 2016-10-29 DIAGNOSIS — Y9311 Activity, swimming: Secondary | ICD-10-CM | POA: Diagnosis not present

## 2016-10-29 DIAGNOSIS — Y929 Unspecified place or not applicable: Secondary | ICD-10-CM | POA: Insufficient documentation

## 2016-10-29 DIAGNOSIS — X58XXXA Exposure to other specified factors, initial encounter: Secondary | ICD-10-CM | POA: Insufficient documentation

## 2016-10-29 MED ORDER — CIPROFLOXACIN-HYDROCORTISONE 0.2-1 % OT SUSP: OTIC | 0 refills | Status: DC

## 2016-10-29 NOTE — Discharge Instructions (Signed)
Contact a health care provider if:  You have mucus or blood draining from your ear.  You have a fever.  You have ear pain.  You have hearing loss, dizziness, or ringing in your ear.  Get help right away if:  You have sudden hearing loss.  You are very dizzy.  You have severe ear pain.  Your face feels weak or becomes paralyzed.
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
## 2016-12-13 ENCOUNTER — Emergency Department (HOSPITAL_COMMUNITY): Payer: Medicaid Other

## 2016-12-13 ENCOUNTER — Emergency Department (HOSPITAL_COMMUNITY)
Admission: EM | Admit: 2016-12-13 | Discharge: 2016-12-13 | Disposition: A | Discharge status: Home / Self Care | Payer: Medicaid Other | Attending: Emergency Medicine | Admitting: Emergency Medicine

## 2016-12-13 ENCOUNTER — Encounter (HOSPITAL_COMMUNITY): Payer: Self-pay | Admitting: Emergency Medicine

## 2016-12-13 DIAGNOSIS — F17210 Nicotine dependence, cigarettes, uncomplicated: Secondary | ICD-10-CM | POA: Insufficient documentation

## 2016-12-13 DIAGNOSIS — Y999 Unspecified external cause status: Secondary | ICD-10-CM | POA: Diagnosis not present

## 2016-12-13 DIAGNOSIS — S3992XA Unspecified injury of lower back, initial encounter: Secondary | ICD-10-CM | POA: Diagnosis present

## 2016-12-13 DIAGNOSIS — S39012A Strain of muscle, fascia and tendon of lower back, initial encounter: Secondary | ICD-10-CM | POA: Diagnosis not present

## 2016-12-13 DIAGNOSIS — Z79899 Other long term (current) drug therapy: Secondary | ICD-10-CM | POA: Diagnosis not present

## 2016-12-13 DIAGNOSIS — E039 Hypothyroidism, unspecified: Secondary | ICD-10-CM | POA: Insufficient documentation

## 2016-12-13 DIAGNOSIS — Y92410 Unspecified street and highway as the place of occurrence of the external cause: Secondary | ICD-10-CM | POA: Diagnosis not present

## 2016-12-13 DIAGNOSIS — Y939 Activity, unspecified: Secondary | ICD-10-CM | POA: Diagnosis not present

## 2016-12-13 MED ORDER — IBUPROFEN 600 MG PO TABS: 600.0000 mg | ORAL_TABLET | Freq: Four times a day (QID) | ORAL | 0 refills | Status: DC | PRN

## 2016-12-13 MED ORDER — CYCLOBENZAPRINE HCL 5 MG PO TABS: 5.0000 mg | ORAL_TABLET | Freq: Three times a day (TID) | ORAL | 0 refills | Status: DC | PRN
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
## 2017-10-22 ENCOUNTER — Encounter (HOSPITAL_COMMUNITY): Payer: Self-pay | Admitting: Emergency Medicine

## 2017-10-22 ENCOUNTER — Emergency Department (HOSPITAL_COMMUNITY)
Admission: EM | Admit: 2017-10-22 | Discharge: 2017-10-22 | Disposition: A | Discharge status: Home / Self Care | Payer: Medicaid Other | Attending: Emergency Medicine | Admitting: Emergency Medicine

## 2017-10-22 ENCOUNTER — Other Ambulatory Visit: Payer: Self-pay

## 2017-10-22 DIAGNOSIS — J45909 Unspecified asthma, uncomplicated: Secondary | ICD-10-CM | POA: Diagnosis not present

## 2017-10-22 DIAGNOSIS — H9202 Otalgia, left ear: Secondary | ICD-10-CM | POA: Diagnosis present

## 2017-10-22 DIAGNOSIS — F17210 Nicotine dependence, cigarettes, uncomplicated: Secondary | ICD-10-CM | POA: Diagnosis not present

## 2017-10-22 DIAGNOSIS — R030 Elevated blood-pressure reading, without diagnosis of hypertension: Secondary | ICD-10-CM | POA: Insufficient documentation

## 2017-10-22 DIAGNOSIS — H60332 Swimmer's ear, left ear: Secondary | ICD-10-CM

## 2017-10-22 DIAGNOSIS — Z91040 Latex allergy status: Secondary | ICD-10-CM | POA: Insufficient documentation

## 2017-10-22 DIAGNOSIS — Z79899 Other long term (current) drug therapy: Secondary | ICD-10-CM | POA: Diagnosis not present

## 2017-10-22 DIAGNOSIS — E039 Hypothyroidism, unspecified: Secondary | ICD-10-CM | POA: Insufficient documentation

## 2017-10-22 MED ORDER — TRAMADOL HCL 50 MG PO TABS: ORAL_TABLET | ORAL | 0 refills | Status: DC

## 2017-10-22 MED ORDER — CIPROFLOXACIN HCL 0.2 % OT SOLN: OTIC | 0 refills | Status: DC

## 2017-10-22 MED ORDER — IBUPROFEN 600 MG PO TABS: 600.0000 mg | ORAL_TABLET | Freq: Four times a day (QID) | ORAL | 0 refills | Status: DC

## 2017-10-22 MED ORDER — DEXAMETHASONE 4 MG PO TABS: 4.0000 mg | ORAL_TABLET | Freq: Two times a day (BID) | ORAL | 0 refills | Status: DC
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
## 2017-10-22 NOTE — Discharge Instructions (Signed)
Your blood pressure is slightly elevated at 125/100, otherwise your vital signs are within normal limits.  Your examination suggest otitis externa, or swimmer's ear.  Please use 4 drops of Cipro in the left ear every 12 hours for 7 days.  Please use Decadron 2 times daily with food.  Please use ibuprofen with breakfast, lunch, dinner, and at bedtime. Use ultram for more severe pain This medication may cause drowsiness. Please do not drink, drive, or participate in activity that requires concentration while taking this medication.  Please see Dr. Salvadore Farber in the office for additional evaluation if not improving.

## 2017-10-22 NOTE — ED Triage Notes (Signed)
Pt c/o left ear pain x 2 days. iburporfen with no relief. Pt crying. Denies drainage

## 2019-08-20 ENCOUNTER — Ambulatory Visit: Payer: Medicaid Other

## 2019-09-27 ENCOUNTER — Encounter (HOSPITAL_COMMUNITY): Payer: Self-pay | Admitting: *Deleted

## 2019-09-27 ENCOUNTER — Emergency Department (HOSPITAL_COMMUNITY)
Admission: EM | Admit: 2019-09-27 | Discharge: 2019-09-27 | Disposition: A | Discharge status: Home / Self Care | Payer: Medicaid Other | Attending: Emergency Medicine | Admitting: Emergency Medicine

## 2019-09-27 ENCOUNTER — Other Ambulatory Visit: Payer: Self-pay

## 2019-09-27 DIAGNOSIS — Z79899 Other long term (current) drug therapy: Secondary | ICD-10-CM | POA: Insufficient documentation

## 2019-09-27 DIAGNOSIS — Z87891 Personal history of nicotine dependence: Secondary | ICD-10-CM | POA: Diagnosis not present

## 2019-09-27 DIAGNOSIS — I10 Essential (primary) hypertension: Secondary | ICD-10-CM | POA: Diagnosis not present

## 2019-09-27 DIAGNOSIS — R55 Syncope and collapse: Secondary | ICD-10-CM

## 2019-09-27 DIAGNOSIS — E039 Hypothyroidism, unspecified: Secondary | ICD-10-CM | POA: Insufficient documentation

## 2019-09-27 DIAGNOSIS — Z91040 Latex allergy status: Secondary | ICD-10-CM | POA: Insufficient documentation

## 2019-09-27 HISTORY — DX: Essential (primary) hypertension: I10

## 2019-09-27 LAB — CBC
HCT: 40.9 % (ref 36.0–46.0)
Hemoglobin: 12.9 g/dL (ref 12.0–15.0)
MCH: 28.9 pg (ref 26.0–34.0)
MCHC: 31.5 g/dL (ref 30.0–36.0)
MCV: 91.5 fL (ref 80.0–100.0)
Platelets: 285 10*3/uL (ref 150–400)
RBC: 4.47 MIL/uL (ref 3.87–5.11)
RDW: 12.8 % (ref 11.5–15.5)
WBC: 13.5 10*3/uL — ABNORMAL HIGH (ref 4.0–10.5)
nRBC: 0 % (ref 0.0–0.2)

## 2019-09-27 LAB — URINALYSIS, ROUTINE W REFLEX MICROSCOPIC
Bilirubin Urine: NEGATIVE
Glucose, UA: NEGATIVE mg/dL
Hgb urine dipstick: NEGATIVE
Ketones, ur: NEGATIVE mg/dL
Leukocytes,Ua: NEGATIVE
Nitrite: NEGATIVE
Protein, ur: NEGATIVE mg/dL
Specific Gravity, Urine: 1.021 (ref 1.005–1.030)
pH: 7 (ref 5.0–8.0)

## 2019-09-27 LAB — TROPONIN I (HIGH SENSITIVITY)
Troponin I (High Sensitivity): <2 ng/L (ref <18)
Troponin I (High Sensitivity): <2 ng/L (ref <18)

## 2019-09-27 LAB — BASIC METABOLIC PANEL
Anion gap: 6 (ref 5–15)
BUN: 17 mg/dL (ref 6–20)
CO2: 29 mmol/L (ref 22–32)
Calcium: 8.9 mg/dL (ref 8.9–10.3)
Chloride: 104 mmol/L (ref 98–111)
Creatinine, Ser: 0.81 mg/dL (ref 0.44–1.00)
GFR calc Af Amer: >60 mL/min (ref >60)
GFR calc non Af Amer: >60 mL/min (ref >60)
Glucose, Bld: 125 mg/dL — ABNORMAL HIGH (ref 70–99)
Potassium: 3.5 mmol/L (ref 3.5–5.1)
Sodium: 139 mmol/L (ref 135–145)

## 2019-09-27 LAB — CBG MONITORING, ED: Glucose-Capillary: 112 mg/dL — ABNORMAL HIGH (ref 70–99)

## 2019-09-27 MED ORDER — SODIUM CHLORIDE 0.9% FLUSH: 3.0000 mL | Freq: Once | INTRAVENOUS | Status: DC

## 2019-09-27 MED ORDER — SODIUM CHLORIDE 0.9 % IV BOLUS
1000.0000 mL | Freq: Once | INTRAVENOUS | Status: AC
  Administered 2019-09-27: 1000 mL via INTRAVENOUS

## 2019-09-27 MED ORDER — ALUM & MAG HYDROXIDE-SIMETH 200-200-20 MG/5ML PO SUSP
30.0000 mL | Freq: Once | ORAL | Status: AC
  Administered 2019-09-27: 30 mL via ORAL
  Filled 2019-09-27: qty 30

## 2019-09-27 NOTE — ED Notes (Signed)
Pt given cup of water upon request  

## 2019-09-27 NOTE — ED Provider Notes (Signed)
[facility] EMERGENCY DEPARTMENT Provider Note   CSN: [id number] Arrival date & time: 09/27/19  1629     History Chief Complaint  Patient presents with   Loss of Consciousness    Teresa [name] is a 34 y.o. female with past medical history significant for asthma, GERD, PVCs, HTN, tobacco use, and hypothyroidism who presents the ED via EMS after experiencing a syncopal episode while at church.  Patient reports that she was sitting down outside when she suddenly developed a left-sided/central chest discomfort that "took her breath away" along with lightheadedness and feeling as though she was about to pass out.  Patient also notes seeing a "bunch of black dots".  She proceeded to walk to her car and turn on the [facility] when she evidently fainted.  She was woken up by her husband's friend who noticed her asleep in the vehicle.  On my examination, patient reports that she is feeling back to baseline.  She endorses a mild, 3 out of 10 headache, but denies any room spinning dizziness, chest pain, shortness of breath, pleuritic chest discomfort, abdominal pain, nausea, visual deficits, numbness or weakness, or other focal neurologic deficits.  She is however complaining of heartburn, worse with lying flat, she has had numerous EGD and dilation procedures performed.    HPI     Past Medical History:  Diagnosis Date   Asthma    GERD (gastroesophageal reflux disease)    Hiatal hernia    Hypertension    Hypothyroidism    PVC (premature ventricular contraction)    Tobacco abuse     Patient Active Problem List   Diagnosis Date Noted   Esophageal dysphagia 07/04/2011   Rectal bleed 07/04/2011   Syncopal episodes 11/24/2010   Tobacco abuse    PVC (premature ventricular contraction)    GERD, SEVERE 02/08/2009   EPIGASTRIC PAIN 02/08/2009    Past Surgical History:  Procedure Laterality Date   ABDOMINAL HYSTERECTOMY  08/15/2011   Procedure: HYSTERECTOMY ABDOMINAL;  Surgeon: Lazaro Arms, MD;  Location: [facility];  Service: Gynecology;  Laterality: N/A;   BIOPSY  07/26/2011   GMW:NUUVOZ esophageal erosions consistent with erosive reflux esophagitis.  Patulous EG junction, status post passage of a Maloney dilator (empirically), status post esophageal biopsy  followed dilation/ A 3-4 cm hiatal hernia, otherwise normal stomach, D1, D2.   CESAREAN SECTION  2007 and 2011   CHOLECYSTECTOMY  2007   ESOPHAGOGASTRODUODENOSCOPY  2008   incomplete due to inadequate conscious sedation, circumferential distal esophageal erosions, hiatal hernia   MALONEY DILATION  07/26/2011   Procedure: MALONEY DILATION;  Surgeon: Corbin Ade, MD;  Location: [facility];  Service: Endoscopy;  Laterality: N/A;  63 French Dilator   SALPINGOOPHORECTOMY  08/15/2011   Procedure: SALPINGO OOPHERECTOMY;  Surgeon: Lazaro Arms, MD;  Location: [facility];  Service: Gynecology;  Laterality: Bilateral;   TUBAL LIGATION  2011     OB History    Gravida  2   Para  2   Term  2   Preterm      AB      Living        SAB      TAB      Ectopic      Multiple      Live Births  2           Family History  Problem Relation Age of Onset   Ulcers Mother        Questionable history  Liver disease Neg Hx    Cancer Neg Hx        Colorectal cancer   Anesthesia problems Neg Hx    Hypotension Neg Hx    Malignant hyperthermia Neg Hx    Pseudochol deficiency Neg Hx     Social History   Tobacco Use   Smoking status: Former Smoker    Packs/day: 0.50    Years: 4.00    Pack years: 2.00    Types: Cigarettes    Start date: 07/06/2005   Smokeless tobacco: Never Used  Substance Use Topics   Alcohol use: No    Alcohol/week: 0.0 standard drinks   Drug use: No    Home Medications Prior to Admission medications   Medication Sig Start Date End Date Taking? Authorizing Provider  albuterol (ACCUNEB) 0.63 MG/3ML nebulizer solution Take 1 ampule by nebulization every 6 (six) hours as needed  for wheezing.   Yes [provider]  ALPRAZolam Prudy Feeler) 1 MG tablet Take 1 mg by mouth daily as needed for anxiety. 08/25/19  Yes [provider]  cetirizine (ZYRTEC) 10 MG tablet Take 1 tablet by mouth daily. 09/18/19 09/17/20 Yes [provider]  escitalopram (LEXAPRO) 20 MG tablet Take 20 mg by mouth daily. 08/25/19  Yes [provider]  gabapentin (NEURONTIN) 300 MG capsule See admin instructions. TAKE 2 CAPSULES BY MOUTH IN THE AFTERNOON AND 4 CAPSULES AT BEDTIME 09/06/19  Yes [provider]  levothyroxine (SYNTHROID) 100 MCG tablet Take 100 mcg by mouth daily. 07/03/19  Yes [provider]  lisinopril (ZESTRIL) 10 MG tablet Take 10 mg by mouth daily. 08/25/19  Yes [provider]  montelukast (SINGULAIR) 10 MG tablet Take 10 mg by mouth daily. 09/11/19  Yes [provider]  propranolol ER (INDERAL LA) 120 MG 24 hr capsule Take 120 mg by mouth daily. 09/05/19  Yes [provider]  tiZANidine (ZANAFLEX) 4 MG tablet Take 4 mg by mouth 3 (three) times daily. 09/07/19  Yes [provider]  azithromycin (ZITHROMAX Z-PAK) 250 MG tablet 2 po day one, then 1 daily x 4 days Patient not taking: Reported on 12/13/2016 08/13/16   Bethann Berkshire, MD  ranitidine (ZANTAC) 150 MG tablet Take 150 mg by mouth 2 (two) times daily.  08/08/11  [provider]    Allergies    Betadine [povidone iodine], Ceclor [cefaclor], Hydrocodone, Morphine and related, Adhesive [tape], and Latex  Review of Systems   Review of Systems  All other systems reviewed and are negative.   Physical Exam Updated Vital Signs BP (!) 138/99    Pulse 92    Temp 98.4 F (36.9 C) (Oral)    Resp (!) 24    Ht 5\' 5"  (1.651 m)    Wt 99.8 kg    LMP 06/29/2011    SpO2 99%    BMI 36.61 kg/m   Physical Exam Vitals and nursing note reviewed. Exam conducted with a chaperone present.  Constitutional:      Appearance: Normal appearance.  HENT:     Head:  Normocephalic and atraumatic.  Eyes:     General: No scleral icterus.    Conjunctiva/sclera: Conjunctivae normal.  Cardiovascular:     Rate and Rhythm: Normal rate and regular rhythm.     Pulses: Normal pulses.     Heart sounds: Normal heart sounds.  Pulmonary:     Effort: Pulmonary effort is normal. No respiratory distress.     Breath sounds: Normal breath sounds.  Comments: No increased respiratory effort.  No chest discomfort with deep inspiration.  No pleuritic cough. Abdominal:     General: Abdomen is flat. There is no distension.     Palpations: Abdomen is soft.     Tenderness: There is no abdominal tenderness. There is no guarding.  Musculoskeletal:     Cervical back: Normal range of motion. No rigidity.  Skin:    General: Skin is dry.     Capillary Refill: Capillary refill takes less than 2 seconds.  Neurological:     Mental Status: She is alert and oriented to person, place, and time.     GCS: GCS eye subscore is 4. GCS verbal subscore is 5. GCS motor subscore is 6.  Psychiatric:        Mood and Affect: Mood normal.        Behavior: Behavior normal.        Thought Content: Thought content normal.     ED Results / Procedures / Treatments   Labs (all labs ordered are listed, but only abnormal results are displayed) Labs Reviewed  BASIC METABOLIC PANEL - Abnormal; Notable for the following components:      Result Value   Glucose, Bld 125 (*)    All other components within normal limits  CBC - Abnormal; Notable for the following components:   WBC 13.5 (*)    All other components within normal limits  URINALYSIS, ROUTINE W REFLEX MICROSCOPIC - Abnormal; Notable for the following components:   APPearance HAZY (*)    All other components within normal limits  CBG MONITORING, ED - Abnormal; Notable for the following components:   Glucose-Capillary 112 (*)    All other components within normal limits  TROPONIN I (HIGH SENSITIVITY)  TROPONIN I (HIGH SENSITIVITY)     EKG EKG Interpretation  Date/Time:  Sunday Sep 27 2019 16:44:54 EDT Ventricular Rate:  97 PR Interval:    QRS Duration: 96 QT Interval:  382 QTC Calculation: 486 R Axis:   94 Text Interpretation: Sinus rhythm Borderline right axis deviation Borderline T abnormalities, diffuse leads Borderline prolonged QT interval Since last tracing Pulmonary disease pattern NOW PRESENT Confirmed by Susy Frizzle [phone]) on 09/27/2019 8:46:20 PM   Radiology No results found.  Procedures Procedures (including critical care time)  Medications Ordered in ED Medications  sodium chloride flush (NS) 0.9 % injection 3 mL (3 mLs Intravenous Not Given 09/27/19 1959)  alum & mag hydroxide-simeth (MAALOX/MYLANTA) 200-200-20 MG/5ML suspension 30 mL (30 mLs Oral Given 09/27/19 1823)  sodium chloride 0.9 % bolus 1,000 mL (1,000 mLs Intravenous New Bag/Given 09/27/19 2001)    ED Course  I have reviewed the triage vital signs and the nursing notes.  Pertinent labs & imaging results that were available during my care of the patient were reviewed by me and considered in my medical decision making (see chart for details).    MDM Rules/Calculators/A&P                      Patient informs me that this happened in a similar fashion 9 years ago where she was sitting down and experienced a presyncopal prodrome immediately prior to falling off of a chair and hitting her head.  I reviewed her medical record and patient had been evaluated for possible seizures.  CT the head was negative and outpatient EEG and MRI were unremarkable.  She subsequently was reevaluated for palpitations and was ultimately admitted for frequent PVCs.  Echocardiogram obtained was  unremarkable and she was discharged with 21-day outpatient telemetry which showed no concerning arrhythmia aside from PVCs.    Patient is resting comfortably on my exam and has no complaints.  Will provide 1 L NS given that she has not consumed much water today.  This  may have been a cause of her presyncope and fainting.  Will assess orthostatics prior to discharge.  Patient is able to eat and drink without difficulty.  Patient is s/p total hysterectomy and I am not concerned for ruptured ectopic has because of her syncopal episode.  She denies any tongue biting or incontinence and I have lower suspicion for seizure given prior work-up that was unremarkable.  Her blood glucose was mildly elevated and I have low suspicion for a hypoglycemia induced fainting.  She does not appear altered and she denies any focal deficits now or during her syncopal episode.  She states that she had a prodrome involving seeing "black dots" in addition to chest discomfort, lightheadedness, and feeling as though she would pass out.  This lowers my suspicion for a cardiac induced syncope that is often abrupt and without warning.  EKG obtained demonstrates new S1Q3T3 pattern concerning for PE.  Given her syncopal episode in the context of nonspecific chest discomfort, offered D-dimer and CTA chest to the patient to which she declined.  She feels entirely back to baseline and is wanting to go home.  Patient is PERC negative and given her reassuring vital signs and lack of chest discomfort/shortness of breath, I feel that is reasonable.  Troponin trended x2 and negative.  Low suspicion for ACS or Stokes-Adams syncope.  She has a new leukocytosis to 13.5, but states that she had recently finished a course of prednisone.  Discussed with Dr. Karle Starch who agrees with assessment and plan.    Patient will need to follow-up with primary care provider for ongoing evaluation and management.  Suspect vasovagal syncope versus heat syncope.  Strict ED return precautions discussed.  Do not feel as though any imaging is warranted at this time.  All of the evaluation and work-up results were discussed with the patient and any family at bedside. They were provided opportunity to ask any additional questions and have none  at this time. They have expressed understanding of verbal discharge instructions as well as return precautions and are agreeable to the plan.    Final Clinical Impression(s) / ED Diagnoses Final diagnoses:  Syncope, unspecified syncope type    Rx / DC Orders ED Discharge Orders    None       Reita Chard 09/27/19 2100    Truddie Hidden, MD 09/27/19 2159

## 2019-09-27 NOTE — Discharge Instructions (Addendum)
Please read the attachment on syncope.  Please drink plenty of fluids as you are likely dehydrated today.  Please follow-up with your primary care provider regarding today's encounter.  Given that this is your second episode of syncope, you may require further outpatient work-up.  Please return to the ED or seek immediate medical attention should you experience any new or worsening symptoms.  Your EKG was concerning for possible pulmonary embolism and we did not obtain work-up to rule that out as a cause for your syncopal episode today.

## 2019-09-27 NOTE — ED Triage Notes (Signed)
Pt brought in by [facility] for c/o chest pain; pt was at church when she had a syncopal episode; pt states she started to feel dizzy so she went and sat in her car; pt states she started seeing black dots and "passed out" in her car; pt states when she woke up she was in the church; pt denies any pain

## 2019-10-08 NOTE — Progress Notes (Signed)
Cardiology Office Note:   Date:  10/09/2019  NAME:  Teresa [name]    MRN: [id number] DOB:  1985-06-22   PCP:  Vivien Presto, MD  Cardiologist:  Reatha Harps, MD   Referring MD: Vivien Presto, MD   Chief Complaint  Patient presents with  . Chest Pain   History of Present Illness:   Teresa [name] is a 34 y.o. female with a hx of GERD, HTN, tobacco abuse who is being seen today for the evaluation of chest pain at the request of Corrington, Kip A, MD.  She was evaluated the emergency room at [facility] after a syncopal event.  Apparently the work-up was negative for any cardiac pathology.  Her event was attributed to vasovagal syncope and she was discharged to follow with her primary care physician.  She was evaluated by her primary care physician and she continued to complain of left-sided chest pain.  She has been sent here for referral for evaluation of chest pain.  She reports she had a syncopal event on 09/27/2019.  She reports was at church and passed out.  This was around 1:30 PM in the afternoon.  She reports while standing she became nauseated and had a headache as well as chest pain.  She reports last thing she remembered after blacking out was riding in the hospital.  She reports that her hospital work-up was unclear as they did not explain things to her.  She was discharged home after cardiac enzymes are negative and EKG was unremarkable.  She reports since that ER visit she is had constant left-sided chest pain.  She reports it is 5/8 out of 10 sharp in the left chest.  She states that activity does not make it worse.  There is no associated shortness of breath with it.  She denies any lower extremity edema.  She reports that stress seems to make it worse.  She has 3 children at home and she is trying to work from home as well.  She reports that things are quite stressful at the house.  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  She reports that she  has a 33 year old who is very problematic lately as well.  Symptoms appear to be better when she goes to sleep at night and resolve.  It appears to start the next day.  She does have a longstanding history of acid reflux.  She takes Zantac.  She has taken omeprazole infrequently.  CVD risk factors include morbid obesity with BMI 53.  She does have hypothyroidism and her most recent TSH was 3.5.  She does have hypertension she takes lisinopril.  She is a former smoker but quit a few years ago.  She smoked 10 years in duration.  She does have a history of anxiety and depression and is on Xanax as well as antidepressants.  She reports she is having chest pain during her visit today.  Her EKG demonstrates normal sinus rhythm with heart rate 85 and nonspecific ST-T changes which are likely obesity related.  I see no evidence of acute ischemic change or prior infarction.  She does report a family history of heart disease in her grandparents.  She had a work-up in 2012 that was normal.  This included a normal echocardiogram as well as a monitor which revealed PVCs.  She apparently did not have enough PVCs to require treatment..  Echocardiogram was normal at that time.  She does report  intermittent episodes of palpitations.  She reports that she feels when she has episodes of chest pain that her heart may be skipping a beat.  Past Medical History: Past Medical History:  Diagnosis Date  . Asthma   . GERD (gastroesophageal reflux disease)   . Hiatal hernia   . Hypertension   . Hypothyroidism   . PVC (premature ventricular contraction)   . Tobacco abuse     Past Surgical History: Past Surgical History:  Procedure Laterality Date  . ABDOMINAL HYSTERECTOMY  08/15/2011   Procedure: HYSTERECTOMY ABDOMINAL;  Surgeon: Lazaro Arms, MD;  Location: [facility];  Service: Gynecology;  Laterality: N/A;  . BIOPSY  07/26/2011   YSA:YTKZSW esophageal erosions consistent with erosive reflux esophagitis.  Patulous EG junction,  status post passage of a Maloney dilator (empirically), status post esophageal biopsy  followed dilation/ A 3-4 cm hiatal hernia, otherwise normal stomach, D1, D2.  Marland Kitchen CESAREAN SECTION  2007 and 2011  . CHOLECYSTECTOMY  2007  . ESOPHAGOGASTRODUODENOSCOPY  2008   incomplete due to inadequate conscious sedation, circumferential distal esophageal erosions, hiatal hernia  . MALONEY DILATION  07/26/2011   Procedure: [name] DILATION;  Surgeon: Corbin Ade, MD;  Location: [facility];  Service: Endoscopy;  Laterality: N/A;  37 [place]  . SALPINGOOPHORECTOMY  08/15/2011   Procedure: SALPINGO OOPHERECTOMY;  Surgeon: Lazaro Arms, MD;  Location: [facility];  Service: Gynecology;  Laterality: Bilateral;  . TUBAL LIGATION  2011    Current Medications: Current Meds  Medication Sig  . albuterol (ACCUNEB) 0.63 MG/3ML nebulizer solution Take 1 ampule by nebulization every 6 (six) hours as needed for wheezing.  Marland Kitchen ALPRAZolam (XANAX) 1 MG tablet Take 1 mg by mouth daily as needed for anxiety.  . gabapentin (NEURONTIN) 300 MG capsule See admin instructions. TAKE 2 CAPSULES BY MOUTH IN THE AFTERNOON AND 4 CAPSULES AT BEDTIME  . levothyroxine (SYNTHROID) 100 MCG tablet Take 100 mcg by mouth daily.  Marland Kitchen lisinopril (ZESTRIL) 10 MG tablet Take 10 mg by mouth daily.  . montelukast (SINGULAIR) 10 MG tablet Take 10 mg by mouth daily.  Marland Kitchen tiZANidine (ZANAFLEX) 4 MG tablet Take 4 mg by mouth 3 (three) times daily.     Allergies:    Betadine [povidone iodine], Ceclor [cefaclor], Hydrocodone, Morphine and related, Adhesive [tape], and Latex   Social History: Social History   Socioeconomic History  . Marital status: Married    Spouse name: Not on file  . Number of children: 3  . Years of education: Not on file  . Highest education level: Not on file  Occupational History    Employer: UNEMPLOYED  Tobacco Use  . Smoking status: Former Smoker    Packs/day: 0.50    Years: 10.00    Pack years: 5.00    Types:  Cigarettes    Start date: 07/06/2005  . Smokeless tobacco: Never Used  Substance and Sexual Activity  . Alcohol use: No    Alcohol/week: 0.0 standard drinks  . Drug use: No  . Sexual activity: Yes    Partners: Male    Birth control/protection: None  Other Topics Concern  . Not on file  Social History Narrative   Pt is doing online classes.   Social Determinants of Health   Financial Resource Strain:   . Difficulty of Paying Living Expenses:   Food Insecurity:   . Worried About Programme researcher, broadcasting/film/video in the Last Year:   . [employer] of Food in the Last Year:  Transportation Needs:   . Freight forwarder (Medical):   Marland Kitchen Lack of Transportation (Non-Medical):   Physical Activity:   . Days of Exercise per Week:   . Minutes of Exercise per Session:   Stress:   . Feeling of Stress :   Social Connections:   . Frequency of Communication with Friends and Family:   . Frequency of Social Gatherings with Friends and Family:   . Attends Religious Services:   . Active Member of Clubs or Organizations:   . Attends Banker Meetings:   Marland Kitchen Marital Status:      Family History: The patient's family history includes Heart failure in her paternal grandmother; Hypertension in her mother; Ulcers in her mother. There is no history of Liver disease, Cancer, Anesthesia problems, Hypotension, Malignant hyperthermia, or Pseudochol deficiency.  ROS:   All other ROS reviewed and negative. Pertinent positives noted in the HPI.     EKGs/Labs/Other Studies Reviewed:   The following studies were personally reviewed by me today:  EKG:  EKG is ordered today.  The ekg ordered today demonstrates normal sinus rhythm, heart rate 85, no acute ST-T changes, isolated T wave inversion in lead V3 which is a normal finding, no evidence of prior infarction, and was personally reviewed by me.   Recent Labs: 09/27/2019: BUN 17; Creatinine, Ser 0.81; Hemoglobin 12.9; Platelets 285; Potassium 3.5; Sodium 139    Recent Lipid Panel No results found for: CHOL, TRIG, HDL, CHOLHDL, VLDL, LDLCALC, LDLDIRECT  Physical Exam:   VS:  BP 130/82   Pulse 85   Ht 5' (1.524 m)   Wt 272 lb 3.2 oz (123.5 kg)   LMP 06/29/2011   HC 4" (10.2 cm)   SpO2 99%   BMI 53.16 kg/m    Wt Readings from Last 3 Encounters:  10/09/19 272 lb 3.2 oz (123.5 kg)  09/27/19 220 lb (99.8 kg)  10/29/16 220 lb (99.8 kg)    General: Well nourished, well developed, in no acute distress Heart: Atraumatic, normal size  Eyes: PEERLA, EOMI  Neck: Supple, no JVD Endocrine: No thryomegaly Cardiac: Normal S1, S2; RRR; no murmurs, rubs, or gallops Lungs: Clear to auscultation bilaterally, no wheezing, rhonchi or rales  Abd: Soft, nontender, no hepatomegaly  Ext: No edema, pulses 2+ Musculoskeletal: No deformities, BUE and BLE strength normal and equal Skin: Warm and dry, no rashes   Neuro: Alert and oriented to person, place, time, and situation, CNII-XII grossly intact, no focal deficits  Psych: Normal mood and affect   ASSESSMENT:   Teresa [name] is a 34 y.o. female who presents for the following: 1. Chest pain, unspecified type   2. Tobacco abuse   3. Palpitations     PLAN:   1. Chest pain, unspecified type 2. Tobacco abuse 3. Palpitations -Atypical constant sharp chest pain.  Not alleviated by rest or worsened with exertion.  She does describe significant stress and symptoms are worse with stress.  I highly suspect this is stress related.  This could also possibly acid reflux.  I recommended she restart her omeprazole 40 mg daily.  She will take this routinely.  I also asked her to destress much as possible.  She does have morbid obesity as well as likely untreated sleep apnea.  She will plan for a possible gastric bypass in the future.  She also reports intermittently some episodes of skipped heartbeats.  She did have PVCs in the past.  We will plan to repeat an echocardiogram as  well as put a 1 week Zio patch on her just  to exclude any significant arrhythmia here.  Overall have a low suspicion for any cardiac etiology.  I suspect this is just related to stress versus acid reflux.  Her EKG today demonstrates nonspecific ST-T changes which are likely obesity related.  I see no strong evidence of heart disease on my examination.  We will see her back in 3 months after the above work-up.   Disposition: Return in about 3 months (around 01/09/2020).  Medication Adjustments/Labs and Tests Ordered: Current medicines are reviewed at length with the patient today.  Concerns regarding medicines are outlined above.  Orders Placed This Encounter  Procedures  . LONG TERM MONITOR (3-14 DAYS)  . EKG 12-Lead  . ECHOCARDIOGRAM COMPLETE   Meds ordered this encounter  Medications  . omeprazole (PRILOSEC) 40 MG capsule    Sig: Take 1 capsule (40 mg total) by mouth daily.    Dispense:  90 capsule    Refill:  1    Patient Instructions  Medication Instructions:  Start Omeprazole 40 mg daily    *If you need a refill on your cardiac medications before your next appointment, please call your pharmacy*   Testing/Procedures: Echocardiogram - Your physician has requested that you have an echocardiogram. Echocardiography is a painless test that uses sound waves to create images of your heart. It provides your doctor with information about the size and shape of your heart and how well your heart's chambers and valves are working. This procedure takes approximately one hour. There are no restrictions for this procedure. This will be performed at our [facility] location - [address].  Your physician has recommended that you wear a 7 DAY ZIO-PATCH monitor. The Zio patch cardiac monitor continuously records heart rhythm data for up to 14 days, this is for patients being evaluated for multiple types heart rhythms. For the first 24 hours post application, please avoid getting the Zio monitor wet in the shower or by excessive  sweating during exercise. After that, feel free to carry on with regular activities. Keep soaps and lotions away from the ZIO XT Patch.  This will be mailed to you, please expect 7-10 days to receive.          Follow-Up: At [facility], you and your health needs are our priority.  As part of our continuing mission to provide you with exceptional heart care, we have created designated Provider Care Teams.  These Care Teams include your primary Cardiologist (physician) and Advanced Practice Providers (APPs -  Physician Assistants and Nurse Practitioners) who all work together to provide you with the care you need, when you need it.  We recommend signing up for the patient portal called "MyChart".  Sign up information is provided on this After Visit Summary.  MyChart is used to connect with patients for Virtual Visits (Telemedicine).  Patients are able to view lab/test results, encounter notes, upcoming appointments, etc.  Non-urgent messages can be sent to your provider as well.   To learn more about what you can do with MyChart, go to [email or web address].    Your next appointment:   3 month(s)  The format for your next appointment:   In Person  Provider:   Eleonore Chiquito, MD        Signed, Addison Naegeli. Audie Box, [place]  [address] [place], [place] [address] [phone]  10/09/2019 9:26 AM

## 2019-10-09 ENCOUNTER — Ambulatory Visit (INDEPENDENT_AMBULATORY_CARE_PROVIDER_SITE_OTHER): Payer: Medicaid Other | Admitting: Cardiovascular Disease

## 2019-10-09 ENCOUNTER — Other Ambulatory Visit: Payer: Self-pay

## 2019-10-09 ENCOUNTER — Telehealth: Payer: Self-pay | Admitting: Radiology

## 2019-10-09 ENCOUNTER — Encounter: Payer: Self-pay | Admitting: Cardiovascular Disease

## 2019-10-09 VITALS — BP 130/82 | HR 85 | Ht 60.0 in | Wt 272.2 lb

## 2019-10-09 DIAGNOSIS — R079 Chest pain, unspecified: Secondary | ICD-10-CM | POA: Diagnosis not present

## 2019-10-09 DIAGNOSIS — R002 Palpitations: Secondary | ICD-10-CM

## 2019-10-09 DIAGNOSIS — Z720 Tobacco use: Secondary | ICD-10-CM | POA: Diagnosis not present

## 2019-10-09 MED ORDER — OMEPRAZOLE 40 MG PO CPDR
40.0000 mg | DELAYED_RELEASE_CAPSULE | Freq: Every day | ORAL | 1 refills | Status: DC
Start: 2019-10-09 — End: 2020-03-28

## 2019-10-09 NOTE — Patient Instructions (Signed)
Medication Instructions:  Start Omeprazole 40 mg daily    *If you need a refill on your cardiac medications before your next appointment, please call your pharmacy*   Testing/Procedures: Echocardiogram - Your physician has requested that you have an echocardiogram. Echocardiography is a painless test that uses sound waves to create images of your heart. It provides your doctor with information about the size and shape of your heart and how well your heart's chambers and valves are working. This procedure takes approximately one hour. There are no restrictions for this procedure. This will be performed at our [facility] location - [address], [address].  Your physician has recommended that you wear a 7 DAY ZIO-PATCH monitor. The Zio patch cardiac monitor continuously records heart rhythm data for up to 14 days, this is for patients being evaluated for multiple types heart rhythms. For the first 24 hours post application, please avoid getting the Zio monitor wet in the shower or by excessive sweating during exercise. After that, feel free to carry on with regular activities. Keep soaps and lotions away from the ZIO XT Patch.  This will be mailed to you, please expect 7-10 days to receive.          Follow-Up: At [facility], you and your health needs are our priority.  As part of our continuing mission to provide you with exceptional heart care, we have created designated Provider Care Teams.  These Care Teams include your primary Cardiologist (physician) and Advanced Practice Providers (APPs -  Physician Assistants and Nurse Practitioners) who all work together to provide you with the care you need, when you need it.  We recommend signing up for the patient portal called "MyChart".  Sign up information is provided on this After Visit Summary.  MyChart is used to connect with patients for Virtual Visits (Telemedicine).  Patients are able to view lab/test results, encounter notes, upcoming  appointments, etc.  Non-urgent messages can be sent to your provider as well.   To learn more about what you can do with MyChart, go to [email or web address].    Your next appointment:   3 month(s)  The format for your next appointment:   In Person  Provider:   Lennie Odor, MD

## 2019-10-09 NOTE — Telephone Encounter (Signed)
Enrolled patient for a 7 day Zio monitor to be mailed to patients home.  

## 2019-10-13 ENCOUNTER — Other Ambulatory Visit (INDEPENDENT_AMBULATORY_CARE_PROVIDER_SITE_OTHER): Payer: Medicaid Other

## 2019-10-13 DIAGNOSIS — R002 Palpitations: Secondary | ICD-10-CM | POA: Diagnosis not present

## 2019-10-29 ENCOUNTER — Other Ambulatory Visit (HOSPITAL_COMMUNITY): Payer: Medicaid Other

## 2019-11-10 ENCOUNTER — Ambulatory Visit: Payer: Medicaid Other | Admitting: Cardiovascular Disease

## 2019-11-19 ENCOUNTER — Other Ambulatory Visit: Payer: Self-pay

## 2019-11-19 ENCOUNTER — Ambulatory Visit (HOSPITAL_COMMUNITY): Payer: Medicaid Other | Attending: Cardiology

## 2019-11-19 DIAGNOSIS — R002 Palpitations: Secondary | ICD-10-CM | POA: Diagnosis present

## 2020-01-17 NOTE — Progress Notes (Deleted)
Cardiology Office Note:   Date:  01/17/2020  NAME:  Teresa [name]    MRN: [id number] DOB:  Apr 21, 1986   PCP:  Teresa Presto, MD  Cardiologist:  Teresa Harps, MD  Electrophysiologist:  None   Referring MD: Teresa Presto, MD   No chief complaint on file. ***  History of Present Illness:   Teresa [name] is a 34 y.o. female with a hx of obesity, GERD, HTN, tobacco abuse who presents for follow-up of chest pain and palpitations. Evaluated for atypical CP. Zio negative. Echo normal.   Past Medical History: Past Medical History:  Diagnosis Date  . Asthma   . GERD (gastroesophageal reflux disease)   . Hiatal hernia   . Hypertension   . Hypothyroidism   . PVC (premature ventricular contraction)   . Tobacco abuse     Past Surgical History: Past Surgical History:  Procedure Laterality Date  . ABDOMINAL HYSTERECTOMY  08/15/2011   Procedure: HYSTERECTOMY ABDOMINAL;  Surgeon: Teresa Arms, MD;  Location: [facility];  Service: Gynecology;  Laterality: N/A;  . BIOPSY  07/26/2011   XFG:HWEXHB esophageal erosions consistent with erosive reflux esophagitis.  Patulous EG junction, status post passage of a Maloney dilator (empirically), status post esophageal biopsy  followed dilation/ A 3-4 cm hiatal hernia, otherwise normal stomach, D1, D2.  Marland Kitchen CESAREAN SECTION  2007 and 2011  . CHOLECYSTECTOMY  2007  . ESOPHAGOGASTRODUODENOSCOPY  2008   incomplete due to inadequate conscious sedation, circumferential distal esophageal erosions, hiatal hernia  . MALONEY DILATION  07/26/2011   Procedure: [name] DILATION;  Surgeon: Teresa Ade, MD;  Location: [facility];  Service: Endoscopy;  Laterality: N/A;  [place]  . SALPINGOOPHORECTOMY  08/15/2011   Procedure: SALPINGO OOPHERECTOMY;  Surgeon: Teresa Arms, MD;  Location: [facility];  Service: Gynecology;  Laterality: Bilateral;  . TUBAL LIGATION  2011    Current Medications: No outpatient medications have been marked as taking for the  01/18/20 encounter (Appointment) with O'Neal, Ronnald Ramp, MD.     Allergies:    Betadine [povidone iodine], Ceclor [cefaclor], Hydrocodone, Morphine and related, Adhesive [tape], and Latex   Social History: Social History   Socioeconomic History  . Marital status: Married    Spouse name: Not on file  . Number of children: 3  . Years of education: Not on file  . Highest education level: Not on file  Occupational History    Employer: UNEMPLOYED  Tobacco Use  . Smoking status: Former Smoker    Packs/day: 0.50    Years: 10.00    Pack years: 5.00    Types: Cigarettes    Start date: 07/06/2005  . Smokeless tobacco: Never Used  Vaping Use  . Vaping Use: Former  . Substances: Flavoring  Substance and Sexual Activity  . Alcohol use: No    Alcohol/week: 0.0 standard drinks  . Drug use: No  . Sexual activity: Yes    Partners: Male    Birth control/protection: None  Other Topics Concern  . Not on file  Social History Narrative   Pt is doing online classes.   Social Determinants of Health   Financial Resource Strain:   . Difficulty of Paying Living Expenses: Not on file  Food Insecurity:   . Worried About Programme researcher, broadcasting/film/video in the Last Year: Not on file  . Ran Out of Food in the Last Year: Not on file  Transportation Needs:   . Lack of Transportation (Medical): Not on  file  . Lack of Transportation (Non-Medical): Not on file  Physical Activity:   . Days of Exercise per Week: Not on file  . Minutes of Exercise per Session: Not on file  Stress:   . Feeling of Stress : Not on file  Social Connections:   . Frequency of Communication with Friends and Family: Not on file  . Frequency of Social Gatherings with Friends and Family: Not on file  . Attends Religious Services: Not on file  . Active Member of Clubs or Organizations: Not on file  . Attends Banker Meetings: Not on file  . Marital Status: Not on file     Family History: The patient's ***family  history includes Heart failure in her paternal grandmother; Hypertension in her mother; Ulcers in her mother. There is no history of Liver disease, Cancer, Anesthesia problems, Hypotension, Malignant hyperthermia, or Pseudochol deficiency.  ROS:   All other ROS reviewed and negative. Pertinent positives noted in the HPI.     EKGs/Labs/Other Studies Reviewed:   The following studies were personally reviewed by me today:  EKG:  EKG is *** ordered today.  The ekg ordered today demonstrates ***, and was personally reviewed by me.   Zio 10/13/2019 1. No arrhythmias detected.  2. Rare ectopy.   Echo 11/19/2019 1. Left ventricular ejection fraction, by estimation, is 55 to 60%. The  left ventricle has normal function. The left ventricle has no regional  wall motion abnormalities. Left ventricular diastolic parameters are  indeterminate.  2. Right ventricular systolic function is mildly reduced. The right  ventricular size is normal. Tricuspid regurgitation signal is inadequate  for assessing PA pressure.  3. The mitral valve is normal in structure. Trivial mitral valve  regurgitation. No evidence of mitral stenosis.  4. The aortic valve is normal in structure. Aortic valve regurgitation is  not visualized. No aortic stenosis is present.  5. The inferior vena cava is normal in size with greater than 50%  respiratory variability, suggesting right atrial pressure of 3 mmHg.    Recent Labs: 09/27/2019: BUN 17; Creatinine, Ser 0.81; Hemoglobin 12.9; Platelets 285; Potassium 3.5; Sodium 139   Recent Lipid Panel No results found for: CHOL, TRIG, HDL, CHOLHDL, VLDL, LDLCALC, LDLDIRECT  Physical Exam:   VS:  LMP 06/29/2011    Wt Readings from Last 3 Encounters:  10/09/19 272 lb 3.2 oz (123.5 kg)  09/27/19 220 lb (99.8 kg)  10/29/16 220 lb (99.8 kg)    General: Well nourished, well developed, in no acute distress Heart: Atraumatic, normal size  Eyes: PEERLA, EOMI  Neck: Supple, no  JVD Endocrine: No thryomegaly Cardiac: Normal S1, S2; RRR; no murmurs, rubs, or gallops Lungs: Clear to auscultation bilaterally, no wheezing, rhonchi or rales  Abd: Soft, nontender, no hepatomegaly  Ext: No edema, pulses 2+ Musculoskeletal: No deformities, BUE and BLE strength normal and equal Skin: Warm and dry, no rashes   Neuro: Alert and oriented to person, place, time, and situation, CNII-XII grossly intact, no focal deficits  Psych: Normal mood and affect   ASSESSMENT:   Teresa [name] is a 34 y.o. female who presents for the following: No diagnosis found.  PLAN:   There are no diagnoses linked to this encounter.  Disposition: No follow-ups on file.  Medication Adjustments/Labs and Tests Ordered: Current medicines are reviewed at length with the patient today.  Concerns regarding medicines are outlined above.  No orders of the defined types were placed in this encounter.  No orders  of the defined types were placed in this encounter.   There are no Patient Instructions on file for this visit.   Time Spent with Patient: I have spent a total of *** minutes with patient reviewing hospital notes, telemetry, EKGs, labs and examining the patient as well as establishing an assessment and plan that was discussed with the patient.  > 50% of time was spent in direct patient care.  Signed, Lenna Gilford. Flora Lipps, MD [facility]  [address], [address] [place], [place] [address] [phone]  01/17/2020 11:10 AM

## 2020-01-18 ENCOUNTER — Ambulatory Visit: Payer: Medicaid Other | Admitting: Cardiovascular Disease

## 2020-03-28 ENCOUNTER — Other Ambulatory Visit: Payer: Self-pay | Admitting: Cardiovascular Disease

## 2020-04-07 ENCOUNTER — Encounter: Payer: Self-pay | Admitting: Emergency Medicine

## 2020-04-07 ENCOUNTER — Ambulatory Visit
Admission: EM | Admit: 2020-04-07 | Discharge: 2020-04-07 | Disposition: A | Discharge status: Home / Self Care | Payer: Medicaid Other | Attending: Emergency Medicine | Admitting: Emergency Medicine

## 2020-04-07 DIAGNOSIS — L239 Allergic contact dermatitis, unspecified cause: Secondary | ICD-10-CM

## 2020-04-07 MED ORDER — PREDNISONE 10 MG (21) PO TBPK
ORAL_TABLET | ORAL | 0 refills | Status: DC
Start: 2020-04-07 — End: 2021-06-19

## 2020-04-07 MED ORDER — DEXAMETHASONE SODIUM PHOSPHATE 10 MG/ML IJ SOLN
10.0000 mg | Freq: Once | INTRAMUSCULAR | Status: AC
  Administered 2020-04-07: 10 mg via INTRAMUSCULAR

## 2020-04-07 MED ORDER — CETIRIZINE HCL 10 MG PO TABS: 10.0000 mg | ORAL_TABLET | Freq: Every day | ORAL | 0 refills | Status: DC

## 2020-04-07 NOTE — ED Triage Notes (Signed)
Patient has itchy red rash on both legs, stomach and LT arm. States she was on some weight loss patches and those are the areas she has placed the patches.  States she has quit using them now.

## 2020-04-07 NOTE — Discharge Instructions (Signed)
Prescribed zyrtec and prednisone  take as prescribed and to completion Limit hot shower and baths, or bathe with warm water.   Moisturize skin daily Follow up with PCP if symptoms persists Return or go to the ER if you have any new or worsening symptoms 

## 2020-04-07 NOTE — ED Provider Notes (Signed)
[facility]   [id number] 04/07/20 Arrival Time: 1337   Chief Complaint  Patient presents with  . Allergic Reaction     SUBJECTIVE: History from: patient and family.  Teresa [name] is a 34 y.o. female presented to the urgent care with a complaint of rash for the past 3 to 4 days..States she has been using a weight loss patch and developed the rash at the site of the patch.  Localized the rash to her abdomen, bilateral lower extremities and arm.  She describes it as red and itchy.  Has not tried any OTC medication.  Denies aggravating factors.denies similar symptoms in the past.  Denies chills, fever, nausea, vomiting, diarrhea.  ROS: As per HPI.  All other pertinent ROS negative.     Past Medical History:  Diagnosis Date  . Asthma   . GERD (gastroesophageal reflux disease)   . Hiatal hernia   . Hypertension   . Hypothyroidism   . PVC (premature ventricular contraction)   . Tobacco abuse    Past Surgical History:  Procedure Laterality Date  . ABDOMINAL HYSTERECTOMY  08/15/2011   Procedure: HYSTERECTOMY ABDOMINAL;  Surgeon: Lazaro Arms, MD;  Location: [facility];  Service: Gynecology;  Laterality: N/A;  . BIOPSY  07/26/2011   FMB:[id number] esophageal erosions consistent with erosive reflux esophagitis.  Patulous EG junction, status post passage of a Maloney dilator (empirically), status post esophageal biopsy  followed dilation/ A 3-4 cm hiatal hernia, otherwise normal stomach, D1, D2.  Marland Kitchen CESAREAN SECTION  2007 and 2011  . CHOLECYSTECTOMY  2007  . ESOPHAGOGASTRODUODENOSCOPY  2008   incomplete due to inadequate conscious sedation, circumferential distal esophageal erosions, hiatal hernia  . MALONEY DILATION  07/26/2011   Procedure: [name] DILATION;  Surgeon: Corbin Ade, MD;  Location: [facility];  Service: Endoscopy;  Laterality: N/A;  40 [place]  . SALPINGOOPHORECTOMY  08/15/2011   Procedure: SALPINGO OOPHERECTOMY;  Surgeon: Lazaro Arms, MD;  Location: [facility];   Service: Gynecology;  Laterality: Bilateral;  . TUBAL LIGATION  2011   Allergies  Allergen Reactions  . Betadine [Povidone Iodine] Hives  . Ceclor [Cefaclor] Hives  . Hydrocodone Itching  . Morphine And Related     PVCs and "it stops my heart" per patient.  . Adhesive [Tape] Rash  . Latex Rash   No current facility-administered medications on file prior to encounter.   Current Outpatient Medications on File Prior to Encounter  Medication Sig Dispense Refill  . albuterol (ACCUNEB) 0.63 MG/3ML nebulizer solution Take 1 ampule by nebulization every 6 (six) hours as needed for wheezing.    Marland Kitchen ALPRAZolam (XANAX) 1 MG tablet Take 1 mg by mouth daily as needed for anxiety.    . gabapentin (NEURONTIN) 300 MG capsule See admin instructions. TAKE 2 CAPSULES BY MOUTH IN THE AFTERNOON AND 4 CAPSULES AT BEDTIME    . levothyroxine (SYNTHROID) 100 MCG tablet Take 100 mcg by mouth daily.    Marland Kitchen lisinopril (ZESTRIL) 10 MG tablet Take 10 mg by mouth daily.    . montelukast (SINGULAIR) 10 MG tablet Take 10 mg by mouth daily.    Marland Kitchen omeprazole (PRILOSEC) 40 MG capsule TAKE 1 CAPSULE(40 MG) BY MOUTH DAILY 90 capsule 2  . tiZANidine (ZANAFLEX) 4 MG tablet Take 4 mg by mouth 3 (three) times daily.    . [DISCONTINUED] ranitidine (ZANTAC) 150 MG tablet Take 150 mg by mouth 2 (two) times daily.     Social History   Socioeconomic History  .  Marital status: Married    Spouse name: Not on file  . Number of children: 3  . Years of education: Not on file  . Highest education level: Not on file  Occupational History    Employer: UNEMPLOYED  Tobacco Use  . Smoking status: Former Smoker    Packs/day: 0.50    Years: 10.00    Pack years: 5.00    Types: Cigarettes    Start date: 07/06/2005  . Smokeless tobacco: Never Used  Vaping Use  . Vaping Use: Former  . Substances: Flavoring  Substance and Sexual Activity  . Alcohol use: No    Alcohol/week: 0.0 standard drinks  . Drug use: No  . Sexual activity: Yes     Partners: Male    Birth control/protection: None  Other Topics Concern  . Not on file  Social History Narrative   Pt is doing online classes.   Social Determinants of Health   Financial Resource Strain:   . Difficulty of Paying Living Expenses: Not on file  Food Insecurity:   . Worried About Programme researcher, broadcasting/film/video in the Last Year: Not on file  . Ran Out of Food in the Last Year: Not on file  Transportation Needs:   . Lack of Transportation (Medical): Not on file  . Lack of Transportation (Non-Medical): Not on file  Physical Activity:   . Days of Exercise per Week: Not on file  . Minutes of Exercise per Session: Not on file  Stress:   . Feeling of Stress : Not on file  Social Connections:   . Frequency of Communication with Friends and Family: Not on file  . Frequency of Social Gatherings with Friends and Family: Not on file  . Attends Religious Services: Not on file  . Active Member of Clubs or Organizations: Not on file  . Attends Banker Meetings: Not on file  . Marital Status: Not on file  Intimate Partner Violence:   . Fear of Current or Ex-Partner: Not on file  . Emotionally Abused: Not on file  . Physically Abused: Not on file  . Sexually Abused: Not on file   Family History  Problem Relation Age of Onset  . Ulcers Mother        Questionable history  . Hypertension Mother   . Heart failure Paternal Grandmother   . Liver disease Neg Hx   . Cancer Neg Hx        Colorectal cancer  . Anesthesia problems Neg Hx   . Hypotension Neg Hx   . Malignant hyperthermia Neg Hx   . Pseudochol deficiency Neg Hx     OBJECTIVE:  Vitals:   04/07/20 1345  BP: (!) 134/91  Pulse: (!) 111  Resp: 19  Temp: 98.2 F (36.8 C)  TempSrc: Oral  SpO2: 95%  Weight: 237 lb (107.5 kg)  Height: 5\' 6"  (1.676 m)     Physical Exam Vitals and nursing note reviewed.  Constitutional:      General: She is not in acute distress.    Appearance: Normal appearance. She is  normal weight. She is not ill-appearing, toxic-appearing or diaphoretic.  HENT:     Head: Normocephalic.  Cardiovascular:     Rate and Rhythm: Normal rate and regular rhythm.     Pulses: Normal pulses.     Heart sounds: Normal heart sounds. No murmur heard.  No friction rub. No gallop.   Pulmonary:     Effort: Pulmonary effort is normal. No respiratory distress.  Breath sounds: Normal breath sounds. No stridor. No wheezing, rhonchi or rales.  Chest:     Chest wall: No tenderness.  Skin:    General: Skin is warm.     Findings: Rash present. Rash is macular.  Neurological:     Mental Status: She is alert and oriented to person, place, and time.     LABS:  No results found for this or any previous visit (from the past 24 hour(s)).   ASSESSMENT & PLAN:  1. Allergic dermatitis     Meds ordered this encounter  Medications  . dexamethasone (DECADRON) injection 10 mg  . cetirizine (ZYRTEC ALLERGY) 10 MG tablet    Sig: Take 1 tablet (10 mg total) by mouth daily.    Dispense:  30 tablet    Refill:  0  . predniSONE (STERAPRED UNI-PAK 21 TAB) 10 MG (21) TBPK tablet    Sig: Take 6 tabs by mouth daily  for 1 days, then 5 tabs for 1 days, then 4 tabs for 1 days, then 3 tabs for 1 days, 2 tabs for 1 days, then 1 tab by mouth daily for 1 days    Dispense:  21 tablet    Refill:  0   Discharge instructions  Prescribed zyrtec and prednisone take as prescribed and to completion Limit hot shower and baths, or bathe with warm water.   Moisturize skin daily Follow up with PCP if symptoms persists Return or go to the ER if you have any new or worsening symptoms  Reviewed expectations re: course of current medical issues. Questions answered. Outlined signs and symptoms indicating need for more acute intervention. Patient verbalized understanding. After Visit Summary given.         Durward Parcel, FNP 04/07/20 1353

## 2020-04-13 ENCOUNTER — Encounter: Payer: Self-pay | Admitting: Emergency Medicine

## 2020-04-13 ENCOUNTER — Ambulatory Visit
Admission: EM | Admit: 2020-04-13 | Discharge: 2020-04-13 | Disposition: A | Discharge status: Home / Self Care | Payer: Medicaid Other | Attending: Internal Medicine | Admitting: Internal Medicine

## 2020-04-13 DIAGNOSIS — T7840XD Allergy, unspecified, subsequent encounter: Secondary | ICD-10-CM | POA: Diagnosis not present

## 2020-04-13 MED ORDER — CYPROHEPTADINE HCL 4 MG PO TABS: 4.0000 mg | ORAL_TABLET | Freq: Three times a day (TID) | ORAL | 0 refills | Status: DC | PRN

## 2020-04-13 MED ORDER — TRIAMCINOLONE ACETONIDE 0.1 % EX CREA
1.0000 | TOPICAL_CREAM | Freq: Two times a day (BID) | CUTANEOUS | 0 refills | Status: DC
Start: 2020-04-13 — End: 2021-06-19

## 2020-04-13 NOTE — ED Triage Notes (Signed)
Pt seen here on 04/07/20 for rash to both legs, stomach and LT arm on areas that she had worn weight loss patches.  Pt was given steroid shot in office and has finished prednisone rx.  Pt states the rash is worse.

## 2020-04-13 NOTE — Discharge Instructions (Signed)
Use the triamcinolone cream slightly for 7-10 days, but stop it is not helping after 7 days Follow up with dermatology

## 2020-04-13 NOTE — ED Provider Notes (Signed)
[facility]    CSN: [id number] Arrival date & time: 04/13/20  1111      History   Chief Complaint Chief Complaint  Patient presents with  . Rash    HPI Teresa [name] is a 34 y.o. female who presents with an unresolved rash from where she had [employer] weight loss patches after taking oral prednisone and getting a steroid injection last week. She continues having itching on those areas. She just applied OTC hydrocortisone and it is still itching her. She called the company to find out if there has been any changes in the supplement or patch,  Since she has been using them since June and never had any trouble with them til her shipment in October this year. They told her no. She also denies going to a tanning bed at all.     Past Medical History:  Diagnosis Date  . Asthma   . GERD (gastroesophageal reflux disease)   . Hiatal hernia   . Hypertension   . Hypothyroidism   . PVC (premature ventricular contraction)   . Tobacco abuse     Patient Active Problem List   Diagnosis Date Noted  . Esophageal dysphagia 07/04/2011  . Rectal bleed 07/04/2011  . Syncopal episodes 11/24/2010  . Tobacco abuse   . PVC (premature ventricular contraction)   . GERD, SEVERE 02/08/2009  . EPIGASTRIC PAIN 02/08/2009    Past Surgical History:  Procedure Laterality Date  . ABDOMINAL HYSTERECTOMY  08/15/2011   Procedure: HYSTERECTOMY ABDOMINAL;  Surgeon: Lazaro Arms, MD;  Location: [facility];  Service: Gynecology;  Laterality: N/A;  . BIOPSY  07/26/2011   [id number] esophageal erosions consistent with erosive reflux esophagitis.  Patulous EG junction, status post passage of a Maloney dilator (empirically), status post esophageal biopsy  followed dilation/ A 3-4 cm hiatal hernia, otherwise normal stomach, D1, D2.  Marland Kitchen CESAREAN SECTION  2007 and 2011  . CHOLECYSTECTOMY  2007  . ESOPHAGOGASTRODUODENOSCOPY  2008   incomplete due to inadequate conscious sedation, circumferential distal  esophageal erosions, hiatal hernia  . MALONEY DILATION  07/26/2011   Procedure: [name] DILATION;  Surgeon: Corbin Ade, MD;  Location: [facility];  Service: Endoscopy;  Laterality: N/A;  81 [place]  . SALPINGOOPHORECTOMY  08/15/2011   Procedure: SALPINGO OOPHERECTOMY;  Surgeon: Lazaro Arms, MD;  Location: [facility];  Service: Gynecology;  Laterality: Bilateral;  . TUBAL LIGATION  2011    OB History    Gravida  2   Para  2   Term  2   Preterm      AB      Living        SAB      TAB      Ectopic      Multiple      Live Births  2            Home Medications    Prior to Admission medications   Medication Sig Start Date End Date Taking? Authorizing Provider  albuterol (ACCUNEB) 0.63 MG/3ML nebulizer solution Take 1 ampule by nebulization every 6 (six) hours as needed for wheezing.    [provider]  ALPRAZolam Prudy Feeler) 1 MG tablet Take 1 mg by mouth daily as needed for anxiety. 08/25/19   [provider]  cetirizine (ZYRTEC ALLERGY) 10 MG tablet Take 1 tablet (10 mg total) by mouth daily. 04/07/20   Avegno, Zachery Dakins, FNP  cyproheptadine (PERIACTIN) 4 MG tablet Take 1 tablet (4 mg  total) by mouth 3 (three) times daily as needed for allergies. And itching 04/13/20   Rodriguez-Southworth, Nettie Elm, PA-C  gabapentin (NEURONTIN) 300 MG capsule See admin instructions. TAKE 2 CAPSULES BY MOUTH IN THE AFTERNOON AND 4 CAPSULES AT BEDTIME 09/06/19   [provider]  levothyroxine (SYNTHROID) 100 MCG tablet Take 100 mcg by mouth daily. 07/03/19   [provider]  lisinopril (ZESTRIL) 10 MG tablet Take 10 mg by mouth daily. 08/25/19   [provider]  montelukast (SINGULAIR) 10 MG tablet Take 10 mg by mouth daily. 09/11/19   [provider]  omeprazole (PRILOSEC) 40 MG capsule TAKE 1 CAPSULE(40 MG) BY MOUTH DAILY 03/28/20   O'Neal, Ronnald Ramp, MD  predniSONE (STERAPRED UNI-PAK 21 TAB) 10 MG (21) TBPK tablet Take 6 tabs by mouth  daily  for 1 days, then 5 tabs for 1 days, then 4 tabs for 1 days, then 3 tabs for 1 days, 2 tabs for 1 days, then 1 tab by mouth daily for 1 days 04/07/20   Avegno, Zachery Dakins, FNP  tiZANidine (ZANAFLEX) 4 MG tablet Take 4 mg by mouth 3 (three) times daily. 09/07/19   [provider]  triamcinolone (KENALOG) 0.1 % Apply 1 application topically 2 (two) times daily. For 7-10 04/13/20   Rodriguez-Southworth, Nettie Elm, PA-C  ranitidine (ZANTAC) 150 MG tablet Take 150 mg by mouth 2 (two) times daily.  08/08/11  [provider]    Family History Family History  Problem Relation Age of Onset  . Ulcers Mother        Questionable history  . Hypertension Mother   . Heart failure Paternal Grandmother   . Liver disease Neg Hx   . Cancer Neg Hx        Colorectal cancer  . Anesthesia problems Neg Hx   . Hypotension Neg Hx   . Malignant hyperthermia Neg Hx   . Pseudochol deficiency Neg Hx     Social History Social History   Tobacco Use  . Smoking status: Former Smoker    Packs/day: 0.50    Years: 10.00    Pack years: 5.00    Types: Cigarettes    Start date: 07/06/2005  . Smokeless tobacco: Never Used  Vaping Use  . Vaping Use: Former  . Substances: Flavoring  Substance Use Topics  . Alcohol use: No    Alcohol/week: 0.0 standard drinks  . Drug use: No     Allergies   Betadine [povidone iodine], Ceclor [cefaclor], Hydrocodone, Morphine and related, Adhesive [tape], and Latex   Review of Systems Review of Systems  Skin: Positive for rash.       Itching of rash     Physical Exam Triage Vital Signs ED Triage Vitals  Enc Vitals Group     BP 04/13/20 1117 134/81     Pulse Rate 04/13/20 1117 (!) 104     Resp 04/13/20 1117 19     Temp 04/13/20 1117 97.7 F (36.5 C)     Temp Source 04/13/20 1117 Oral     SpO2 04/13/20 1117 93 %     Weight 04/13/20 1119 235 lb 14.3 oz (107 kg)     Height 04/13/20 1119 5\' 6"  (1.676 m)     Head Circumference --      Peak Flow --        Pain Score 04/13/20 1119 0     Pain Loc --      Pain Edu? --      Excl. in  GC? --    No data found.  Updated Vital Signs BP 134/81 (BP Location: Right Arm)   Pulse (!) 104   Temp 97.7 F (36.5 C) (Oral)   Resp 19   Ht 5\' 6"  (1.676 m)   Wt 235 lb 14.3 oz (107 kg)   LMP 06/29/2011   SpO2 93%   BMI 38.07 kg/m   Visual Acuity Right Eye Distance:   Left Eye Distance:   Bilateral Distance:    Right Eye Near:   Left Eye Near:    Bilateral Near:     Physical Exam Vitals and nursing note reviewed.  Constitutional:      General: She is not in acute distress.    Appearance: She is obese. She is not toxic-appearing.  HENT:     Head: Normocephalic.     Right Ear: External ear normal.     Left Ear: External ear normal.  Eyes:     General: No scleral icterus.    Conjunctiva/sclera: Conjunctivae normal.  Pulmonary:     Effort: Pulmonary effort is normal.  Musculoskeletal:     Cervical back: Neck supple.  Skin:    General: Skin is warm and dry.     Findings: Rash present.     Comments: Has square and rectangular shaped rash with the one on L abdomen L forearm being beefy red, a little raised, but not painful or warm. The ones on R and, and lateral lower legs are lighter in color, and the one on the upper R lateral is a little papular. Where the other beefy red ones are smooth.   Neurological:     Mental Status: She is alert and oriented to person, place, and time.     Gait: Gait normal.  Psychiatric:        Mood and Affect: Mood normal.        Behavior: Behavior normal.        Thought Content: Thought content normal.        Judgment: Judgment normal.      UC Treatments / Results  Labs (all labs ordered are listed, but only abnormal results are displayed) Labs Reviewed - No data to display  EKG   Radiology No results found.  Procedures Procedures (including critical care time)  Medications Ordered in UC Medications - No data to display  Initial  Impression / Assessment and Plan / UC Course  I have reviewed the triage vital signs and the nursing notes. Has topical allergic reaction that is not resolving. I placed her on Triamcinolone and periactin for the itching and needs to Fu with derm. She will call to find out who can see her the soonest and get a referral from her PCP Final Clinical Impressions(s) / UC Diagnoses   Final diagnoses:  Allergic reaction, subsequent encounter     Discharge Instructions     Use the triamcinolone cream slightly for 7-10 days, but stop it is not helping after 7 days Follow up with dermatology   ED Prescriptions    Medication Sig Dispense Auth. Provider   cyproheptadine (PERIACTIN) 4 MG tablet Take 1 tablet (4 mg total) by mouth 3 (three) times daily as needed for allergies. And itching 30 tablet Rodriguez-Southworth, Catherine Oak, PA-C   triamcinolone (KENALOG) 0.1 % Apply 1 application topically 2 (two) times daily. For 7-10 28.4 g Rodriguez-Southworth, 08/27/2011, PA-C     PDMP not reviewed this encounter.   Nettie Elm, PA-C 04/13/20 1158

## 2021-03-29 ENCOUNTER — Other Ambulatory Visit: Payer: Self-pay | Admitting: Physician Assistant

## 2021-03-29 DIAGNOSIS — M5116 Intervertebral disc disorders with radiculopathy, lumbar region: Secondary | ICD-10-CM

## 2021-04-19 ENCOUNTER — Inpatient Hospital Stay: Admission: RE | Admit: 2021-04-19 | Payer: Medicaid Other | Source: Ambulatory Visit

## 2021-05-25 ENCOUNTER — Other Ambulatory Visit: Payer: Self-pay | Admitting: Physician Assistant

## 2021-05-25 DIAGNOSIS — M545 Low back pain, unspecified: Secondary | ICD-10-CM

## 2021-06-17 ENCOUNTER — Other Ambulatory Visit: Payer: Self-pay

## 2021-06-17 ENCOUNTER — Inpatient Hospital Stay (HOSPITAL_COMMUNITY)
Admission: EM | Admit: 2021-06-17 | Discharge: 2021-06-19 | DRG: 193 | Disposition: A | Discharge status: Home / Self Care | Payer: Medicaid Other | Attending: Internal Medicine | Admitting: Internal Medicine

## 2021-06-17 ENCOUNTER — Emergency Department (HOSPITAL_COMMUNITY): Payer: Medicaid Other

## 2021-06-17 ENCOUNTER — Encounter (HOSPITAL_COMMUNITY): Payer: Self-pay | Admitting: *Deleted

## 2021-06-17 ENCOUNTER — Ambulatory Visit: Admission: EM | Admit: 2021-06-17 | Discharge: 2021-06-17 | Payer: Medicaid Other

## 2021-06-17 DIAGNOSIS — Z79890 Hormone replacement therapy (postmenopausal): Secondary | ICD-10-CM

## 2021-06-17 DIAGNOSIS — I10 Essential (primary) hypertension: Secondary | ICD-10-CM | POA: Diagnosis present

## 2021-06-17 DIAGNOSIS — K219 Gastro-esophageal reflux disease without esophagitis: Secondary | ICD-10-CM | POA: Diagnosis present

## 2021-06-17 DIAGNOSIS — Z720 Tobacco use: Secondary | ICD-10-CM | POA: Diagnosis present

## 2021-06-17 DIAGNOSIS — Z87891 Personal history of nicotine dependence: Secondary | ICD-10-CM | POA: Diagnosis not present

## 2021-06-17 DIAGNOSIS — Z90710 Acquired absence of both cervix and uterus: Secondary | ICD-10-CM

## 2021-06-17 DIAGNOSIS — Z20822 Contact with and (suspected) exposure to covid-19: Secondary | ICD-10-CM | POA: Diagnosis present

## 2021-06-17 DIAGNOSIS — J181 Lobar pneumonia, unspecified organism: Secondary | ICD-10-CM | POA: Diagnosis present

## 2021-06-17 DIAGNOSIS — Z9049 Acquired absence of other specified parts of digestive tract: Secondary | ICD-10-CM | POA: Diagnosis not present

## 2021-06-17 DIAGNOSIS — Z800 Family history of malignant neoplasm of digestive organs: Secondary | ICD-10-CM

## 2021-06-17 DIAGNOSIS — G8929 Other chronic pain: Secondary | ICD-10-CM | POA: Diagnosis present

## 2021-06-17 DIAGNOSIS — R0602 Shortness of breath: Secondary | ICD-10-CM | POA: Diagnosis present

## 2021-06-17 DIAGNOSIS — J45909 Unspecified asthma, uncomplicated: Secondary | ICD-10-CM | POA: Diagnosis present

## 2021-06-17 DIAGNOSIS — G4733 Obstructive sleep apnea (adult) (pediatric): Secondary | ICD-10-CM

## 2021-06-17 DIAGNOSIS — Z8249 Family history of ischemic heart disease and other diseases of the circulatory system: Secondary | ICD-10-CM

## 2021-06-17 DIAGNOSIS — E039 Hypothyroidism, unspecified: Secondary | ICD-10-CM | POA: Diagnosis present

## 2021-06-17 DIAGNOSIS — Z79899 Other long term (current) drug therapy: Secondary | ICD-10-CM

## 2021-06-17 DIAGNOSIS — A419 Sepsis, unspecified organism: Secondary | ICD-10-CM

## 2021-06-17 DIAGNOSIS — Z6841 Body Mass Index (BMI) 40.0 and over, adult: Secondary | ICD-10-CM | POA: Diagnosis not present

## 2021-06-17 DIAGNOSIS — J9601 Acute respiratory failure with hypoxia: Secondary | ICD-10-CM | POA: Diagnosis present

## 2021-06-17 DIAGNOSIS — J189 Pneumonia, unspecified organism: Secondary | ICD-10-CM

## 2021-06-17 LAB — URINALYSIS, ROUTINE W REFLEX MICROSCOPIC
Bacteria, UA: NONE SEEN
Glucose, UA: 50 mg/dL — AB
Ketones, ur: 5 mg/dL — AB
Leukocytes,Ua: NEGATIVE
Nitrite: NEGATIVE
Protein, ur: 100 mg/dL — AB
Specific Gravity, Urine: 1.028 (ref 1.005–1.030)
pH: 5 (ref 5.0–8.0)

## 2021-06-17 LAB — PROTIME-INR
INR: 1.1 (ref 0.8–1.2)
Prothrombin Time: 14.1 seconds (ref 11.4–15.2)

## 2021-06-17 LAB — CBC WITH DIFFERENTIAL/PLATELET
Abs Immature Granulocytes: 0.17 10*3/uL — ABNORMAL HIGH (ref 0.00–0.07)
Basophils Absolute: 0.1 10*3/uL (ref 0.0–0.1)
Basophils Relative: 0 %
Eosinophils Absolute: 0.1 10*3/uL (ref 0.0–0.5)
Eosinophils Relative: 0 %
HCT: 43 % (ref 36.0–46.0)
Hemoglobin: 13.2 g/dL (ref 12.0–15.0)
Immature Granulocytes: 1 %
Lymphocytes Relative: 9 %
Lymphs Abs: 2.1 10*3/uL (ref 0.7–4.0)
MCH: 29 pg (ref 26.0–34.0)
MCHC: 30.7 g/dL (ref 30.0–36.0)
MCV: 94.5 fL (ref 80.0–100.0)
Monocytes Absolute: 1.7 10*3/uL — ABNORMAL HIGH (ref 0.1–1.0)
Monocytes Relative: 7 %
Neutro Abs: 18.6 10*3/uL — ABNORMAL HIGH (ref 1.7–7.7)
Neutrophils Relative %: 83 %
Platelets: 236 10*3/uL (ref 150–400)
RBC: 4.55 MIL/uL (ref 3.87–5.11)
RDW: 13.2 % (ref 11.5–15.5)
WBC: 22.7 10*3/uL — ABNORMAL HIGH (ref 4.0–10.5)
nRBC: 0 % (ref 0.0–0.2)

## 2021-06-17 LAB — APTT: aPTT: 22 seconds — ABNORMAL LOW (ref 24–36)

## 2021-06-17 LAB — COMPREHENSIVE METABOLIC PANEL
ALT: 14 U/L (ref 0–44)
AST: 12 U/L — ABNORMAL LOW (ref 15–41)
Albumin: 3.5 g/dL (ref 3.5–5.0)
Alkaline Phosphatase: 81 U/L (ref 38–126)
Anion gap: 8 (ref 5–15)
BUN: 13 mg/dL (ref 6–20)
CO2: 27 mmol/L (ref 22–32)
Calcium: 8.7 mg/dL — ABNORMAL LOW (ref 8.9–10.3)
Chloride: 101 mmol/L (ref 98–111)
Creatinine, Ser: 1.18 mg/dL — ABNORMAL HIGH (ref 0.44–1.00)
GFR, Estimated: >60 mL/min (ref >60)
Glucose, Bld: 113 mg/dL — ABNORMAL HIGH (ref 70–99)
Potassium: 3.9 mmol/L (ref 3.5–5.1)
Sodium: 136 mmol/L (ref 135–145)
Total Bilirubin: 0.6 mg/dL (ref 0.3–1.2)
Total Protein: 7.4 g/dL (ref 6.5–8.1)

## 2021-06-17 LAB — LACTIC ACID, PLASMA
Lactic Acid, Venous: 1 mmol/L (ref 0.5–1.9)
Lactic Acid, Venous: 1.2 mmol/L (ref 0.5–1.9)

## 2021-06-17 LAB — RESP PANEL BY RT-PCR (FLU A&B, COVID) ARPGX2
Influenza A by PCR: NEGATIVE
Influenza B by PCR: NEGATIVE
SARS Coronavirus 2 by RT PCR: NEGATIVE

## 2021-06-17 LAB — POC URINE PREG, ED: Preg Test, Ur: NEGATIVE

## 2021-06-17 MED ORDER — SODIUM CHLORIDE 0.9 % IV BOLUS (SEPSIS)
500.0000 mL | Freq: Once | INTRAVENOUS | Status: AC
  Administered 2021-06-17: 500 mL via INTRAVENOUS

## 2021-06-17 MED ORDER — SODIUM CHLORIDE 0.9 % IV BOLUS (SEPSIS)
1000.0000 mL | Freq: Once | INTRAVENOUS | Status: AC
  Administered 2021-06-17: 1000 mL via INTRAVENOUS

## 2021-06-17 MED ORDER — LACTATED RINGERS IV SOLN: INTRAVENOUS | Status: AC

## 2021-06-17 MED ORDER — PANTOPRAZOLE SODIUM 40 MG PO TBEC
40.0000 mg | DELAYED_RELEASE_TABLET | Freq: Every day | ORAL | Status: DC
  Administered 2021-06-18 – 2021-06-19 (×2): 40 mg via ORAL
  Filled 2021-06-17 (×2): qty 1

## 2021-06-17 MED ORDER — LACTATED RINGERS IV BOLUS
500.0000 mL | Freq: Once | INTRAVENOUS | Status: AC
  Administered 2021-06-17: 500 mL via INTRAVENOUS

## 2021-06-17 MED ORDER — KETOROLAC TROMETHAMINE 30 MG/ML IJ SOLN
30.0000 mg | Freq: Once | INTRAMUSCULAR | Status: AC
  Administered 2021-06-17: 30 mg via INTRAVENOUS
  Filled 2021-06-17: qty 1

## 2021-06-17 MED ORDER — LEVOFLOXACIN IN D5W 750 MG/150ML IV SOLN: 750.0000 mg | INTRAVENOUS | Status: DC

## 2021-06-17 MED ORDER — ALPRAZOLAM 0.5 MG PO TABS
1.0000 mg | ORAL_TABLET | Freq: Every day | ORAL | Status: DC | PRN
  Administered 2021-06-17 – 2021-06-18 (×2): 1 mg via ORAL
  Filled 2021-06-17 (×2): qty 2

## 2021-06-17 MED ORDER — IOHEXOL 350 MG/ML SOLN
100.0000 mL | Freq: Once | INTRAVENOUS | Status: AC | PRN
  Administered 2021-06-17: 100 mL via INTRAVENOUS

## 2021-06-17 MED ORDER — ACETAMINOPHEN 650 MG RE SUPP: 325.0000 mg | Freq: Four times a day (QID) | RECTAL | Status: DC | PRN

## 2021-06-17 MED ORDER — NICOTINE 21 MG/24HR TD PT24
21.0000 mg | MEDICATED_PATCH | Freq: Every day | TRANSDERMAL | Status: DC
  Administered 2021-06-18: 21 mg via TRANSDERMAL
  Filled 2021-06-17 (×3): qty 1

## 2021-06-17 MED ORDER — BUTALBITAL-APAP-CAFFEINE 50-325-40 MG PO TABS
1.0000 | ORAL_TABLET | Freq: Four times a day (QID) | ORAL | Status: DC | PRN
  Administered 2021-06-17 – 2021-06-18 (×3): 1 via ORAL
  Filled 2021-06-17 (×3): qty 1

## 2021-06-17 MED ORDER — ACETAMINOPHEN 325 MG PO TABS
325.0000 mg | ORAL_TABLET | Freq: Four times a day (QID) | ORAL | Status: DC | PRN
  Administered 2021-06-18 – 2021-06-19 (×3): 325 mg via ORAL
  Filled 2021-06-17 (×3): qty 1

## 2021-06-17 MED ORDER — LEVOTHYROXINE SODIUM 75 MCG PO TABS
175.0000 ug | ORAL_TABLET | Freq: Every day | ORAL | Status: DC
  Administered 2021-06-18 – 2021-06-19 (×2): 175 ug via ORAL
  Filled 2021-06-17 (×2): qty 1

## 2021-06-17 MED ORDER — LEVOFLOXACIN IN D5W 750 MG/150ML IV SOLN
750.0000 mg | Freq: Once | INTRAVENOUS | Status: AC
  Administered 2021-06-17: 750 mg via INTRAVENOUS
  Filled 2021-06-17: qty 150

## 2021-06-17 MED ORDER — GUAIFENESIN ER 600 MG PO TB12
1200.0000 mg | ORAL_TABLET | Freq: Two times a day (BID) | ORAL | Status: DC
  Administered 2021-06-17 – 2021-06-19 (×4): 1200 mg via ORAL
  Filled 2021-06-17 (×4): qty 2

## 2021-06-17 MED ORDER — ENOXAPARIN SODIUM 60 MG/0.6ML IJ SOSY
60.0000 mg | PREFILLED_SYRINGE | INTRAMUSCULAR | Status: DC
  Administered 2021-06-17 – 2021-06-18 (×2): 60 mg via SUBCUTANEOUS
  Filled 2021-06-17 (×2): qty 0.6

## 2021-06-17 NOTE — ED Notes (Signed)
Patient is being discharged from the Urgent Care and sent to the Emergency Department via private vehicle . Per PA, patient is in need of higher level of care due to syncope, increased HR and low O2 sat. Patient is aware and verbalizes understanding of plan of care.  Vitals:   06/17/21 1509  BP: 95/66  Pulse: (!) 122  Resp: (!) 22  Temp: 99.5 F (37.5 C)  SpO2: 90%

## 2021-06-17 NOTE — ED Notes (Signed)
Assumed care of pt at this time.

## 2021-06-17 NOTE — Progress Notes (Signed)
Pharmacy Antibiotic Note  Teresa [name] is a 36 y.o. female admitted on 06/17/2021 with pneumonia.  Pharmacy has been consulted for levaquin dosing.  Plan: Levaquin 750 mg IV every 24 hours.     Temp (24hrs), Avg:99.6 F (37.6 C), Min:99.5 F (37.5 C), Max:99.7 F (37.6 C)  No results for input(s): WBC, CREATININE, LATICACIDVEN, VANCOTROUGH, VANCOPEAK, VANCORANDOM, GENTTROUGH, GENTPEAK, GENTRANDOM, TOBRATROUGH, TOBRAPEAK, TOBRARND, AMIKACINPEAK, AMIKACINTROU, AMIKACIN in the last 168 hours.  CrCl cannot be calculated (Patient's most recent lab result is older than the maximum 21 days allowed.).    Allergies  Allergen Reactions   Betadine [Povidone Iodine] Hives   Ceclor [Cefaclor] Hives   Hydrocodone Itching   Morphine And Related     PVCs and "it stops my heart" per patient.   Adhesive [Tape] Rash   Latex Rash    Antimicrobials this admission: Levaquin 1/21 >>  Microbiology results: 1/21 BCx: pending 1/21 UCx: pending   Thank you for allowing pharmacy to be a part of this patients care.  Ramond Craver 06/17/2021 4:28 PM

## 2021-06-17 NOTE — ED Provider Notes (Signed)
[facility] EMERGENCY DEPARTMENT Provider Note   CSN: [id number] Arrival date & time: 06/17/21  1533     History  Chief Complaint  Patient presents with   Shortness of Breath    TRENECIA [name] is a 36 y.o. female.   Shortness of Breath  36 year old female, she has a history of hypertension and states that she takes an angiotensin receptor blocker, she is on tizanidine and she takes gabapentin for some chronic back pain.  She presents stating that about 3 days ago she started having symptoms including headache, feeling congested, having some difficulty breathing, sinus congestion and was started on Augmentin however she has not improved in fact she has worsened now she is getting severely lightheaded when she stands, having fevers and chills, she presents tachycardic hypotensive and hypoxic from urgent care.  She reports that she tested negative for flu and COVID on Thursday when she saw her family doctor  Home Medications Prior to Admission medications   Medication Sig Start Date End Date Taking? Authorizing Provider  albuterol (ACCUNEB) 0.63 MG/3ML nebulizer solution Take 1 ampule by nebulization every 6 (six) hours as needed for wheezing.    [provider]  ALPRAZolam Duanne Moron) 1 MG tablet Take 1 mg by mouth daily as needed for anxiety. 08/25/19   [provider]  cetirizine (ZYRTEC ALLERGY) 10 MG tablet Take 1 tablet (10 mg total) by mouth daily. 04/07/20   Avegno, Darrelyn Hillock, FNP  cyproheptadine (PERIACTIN) 4 MG tablet Take 1 tablet (4 mg total) by mouth 3 (three) times daily as needed for allergies. And itching 04/13/20   Rodriguez-Southworth, Sunday Spillers, PA-C  gabapentin (NEURONTIN) 300 MG capsule See admin instructions. TAKE 2 CAPSULES BY MOUTH IN THE AFTERNOON AND 4 CAPSULES AT BEDTIME 09/06/19   [provider]  levothyroxine (SYNTHROID) 100 MCG tablet Take 100 mcg by mouth daily. 07/03/19   [provider]  lisinopril (ZESTRIL) 10 MG tablet Take 10  mg by mouth daily. 08/25/19   [provider]  montelukast (SINGULAIR) 10 MG tablet Take 10 mg by mouth daily. 09/11/19   [provider]  omeprazole (PRILOSEC) 40 MG capsule TAKE 1 CAPSULE(40 MG) BY MOUTH DAILY 03/28/20   O'Neal, Cassie Freer, MD  predniSONE (STERAPRED UNI-PAK 21 TAB) 10 MG (21) TBPK tablet Take 6 tabs by mouth daily  for 1 days, then 5 tabs for 1 days, then 4 tabs for 1 days, then 3 tabs for 1 days, 2 tabs for 1 days, then 1 tab by mouth daily for 1 days 04/07/20   Avegno, Darrelyn Hillock, FNP  tiZANidine (ZANAFLEX) 4 MG tablet Take 4 mg by mouth 3 (three) times daily. 09/07/19   [provider]  triamcinolone (KENALOG) 0.1 % Apply 1 application topically 2 (two) times daily. For 7-10 04/13/20   Rodriguez-Southworth, Sunday Spillers, PA-C  ranitidine (ZANTAC) 150 MG tablet Take 150 mg by mouth 2 (two) times daily.  08/08/11  [provider]      Allergies    Betadine [povidone iodine], Ceclor [cefaclor], Hydrocodone, Morphine and related, Adhesive [tape], and Latex    Review of Systems   Review of Systems  Respiratory:  Positive for shortness of breath.   All other systems reviewed and are negative.  Physical Exam Updated Vital Signs BP 92/68    Pulse (!) 117    Temp 99.7 F (37.6 C) (Oral)    Resp (!) 21    Wt 127 kg    LMP 06/29/2011    SpO2 96%  BMI 45.19 kg/m  Physical Exam Vitals and nursing note reviewed.  Constitutional:      General: She is in acute distress.     Appearance: She is well-developed. She is ill-appearing.  HENT:     Head: Normocephalic and atraumatic.     Mouth/Throat:     Mouth: Mucous membranes are dry.     Pharynx: No oropharyngeal exudate.  Eyes:     General: No scleral icterus.       Right eye: No discharge.        Left eye: No discharge.     Conjunctiva/sclera: Conjunctivae normal.     Pupils: Pupils are equal, round, and reactive to light.  Neck:     Thyroid: No thyromegaly.     Vascular: No JVD.   Cardiovascular:     Rate and Rhythm: Regular rhythm. Tachycardia present.     Heart sounds: Normal heart sounds. No murmur heard.   No friction rub. No gallop.  Pulmonary:     Effort: Pulmonary effort is normal. No respiratory distress.     Breath sounds: Wheezing and rhonchi present. No rales.  Abdominal:     General: Bowel sounds are normal. There is no distension.     Palpations: Abdomen is soft. There is no mass.     Tenderness: There is no abdominal tenderness.  Musculoskeletal:        General: No tenderness. Normal range of motion.     Cervical back: Normal range of motion and neck supple.     Right lower leg: No edema.     Left lower leg: No edema.  Lymphadenopathy:     Cervical: No cervical adenopathy.  Skin:    General: Skin is warm and dry.     Findings: No erythema or rash.     Comments: Bruising left lower extremity mid tibia  Neurological:     Mental Status: She is alert.     Coordination: Coordination normal.     Comments: Awake alert and able to follow commands  Psychiatric:        Behavior: Behavior normal.    ED Results / Procedures / Treatments   Labs (all labs ordered are listed, but only abnormal results are displayed) Labs Reviewed  COMPREHENSIVE METABOLIC PANEL - Abnormal; Notable for the following components:      Result Value   Glucose, Bld 113 (*)    Creatinine, Ser 1.18 (*)    Calcium 8.7 (*)    AST 12 (*)    All other components within normal limits  CBC WITH DIFFERENTIAL/PLATELET - Abnormal; Notable for the following components:   WBC 22.7 (*)    Neutro Abs 18.6 (*)    Monocytes Absolute 1.7 (*)    Abs Immature Granulocytes 0.17 (*)    All other components within normal limits  APTT - Abnormal; Notable for the following components:   aPTT 22 (*)    All other components within normal limits  RESP PANEL BY RT-PCR (FLU A&B, COVID) ARPGX2  CULTURE, BLOOD (ROUTINE X 2)  CULTURE, BLOOD (ROUTINE X 2)  URINE CULTURE  LACTIC ACID, PLASMA   PROTIME-INR  LACTIC ACID, PLASMA  URINALYSIS, ROUTINE W REFLEX MICROSCOPIC  POC URINE PREG, ED    EKG EKG Interpretation  Date/Time:  Saturday June 17 2021 16:04:11 EST Ventricular Rate:  119 PR Interval:  143 QRS Duration: 101 QT Interval:  333 QTC Calculation: 469 R Axis:   86 Text Interpretation: Sinus tachycardia Borderline T abnormalities, diffuse leads  Confirmed by Noemi Chapel [phone]) on 06/17/2021 4:15:29 PM  Radiology DG Chest Port 1 View  Result Date: 06/17/2021 CLINICAL DATA:  Questionable sepsis, cough, wheezing EXAM: PORTABLE CHEST 1 VIEW COMPARISON:  Chest x-ray 08/28/2016 FINDINGS: Heart size is normal. Mediastinum appears stable. Large consolidative opacity in the right lower lobe measuring 7.5 x 7.1 cm. Mildly increased peribronchial opacities bilaterally which appear stable and chronic. No pleural effusion or pneumothorax visualized. IMPRESSION: Large consolidative opacity in the right lower lobe which likely represents pneumonia. Correlate clinically and follow-up is recommended to ensure resolution. Electronically Signed   By: Ofilia Neas M.D.   On: 06/17/2021 16:46    Procedures .Critical Care Performed by: Noemi Chapel, MD Authorized by: Noemi Chapel, MD   Critical care provider statement:    Critical care time (minutes):  35   Critical care time was exclusive of:  Separately billable procedures and treating other patients and teaching time   Critical care was necessary to treat or prevent imminent or life-threatening deterioration of the following conditions:  Respiratory failure and sepsis   Critical care was time spent personally by me on the following activities:  Development of treatment plan with patient or surrogate, discussions with consultants, evaluation of patient's response to treatment, examination of patient, ordering and review of laboratory studies, ordering and review of radiographic studies, ordering and performing treatments and  interventions, pulse oximetry, re-evaluation of patient's condition, review of old charts and obtaining history from patient or surrogate   I assumed direction of critical care for this patient from another provider in my specialty: no     Care discussed with: admitting provider   Comments:          Medications Ordered in ED Medications  lactated ringers infusion (has no administration in time range)  sodium chloride 0.9 % bolus 1,000 mL (1,000 mLs Intravenous New Bag/Given 06/17/21 1648)    And  sodium chloride 0.9 % bolus 1,000 mL (has no administration in time range)    And  sodium chloride 0.9 % bolus 1,000 mL (has no administration in time range)    And  sodium chloride 0.9 % bolus 500 mL (has no administration in time range)  levofloxacin (LEVAQUIN) IVPB 750 mg (750 mg Intravenous New Bag/Given 06/17/21 1649)  levofloxacin (LEVAQUIN) IVPB 750 mg (has no administration in time range)  iohexol (OMNIPAQUE) 350 MG/ML injection 100 mL (100 mLs Intravenous Contrast Given 06/17/21 1735)    ED Course/ Medical Decision Making/ A&P                           Medical Decision Making Amount and/or Complexity of Data Reviewed Labs: ordered. Radiology: ordered. ECG/medicine tests: ordered.  Risk Prescription drug management. Decision regarding hospitalization.   This patient presents to the ED for concern of Possible sepsis, multiple abnormal vital signs, this involves an extensive number of treatment options, and is a complaint that carries with it a high risk of complications and morbidity.  The differential diagnosis includes Sepsis, pulmonary embolism, COVID-19, significant infection   Co morbidities that complicate the patient evaluation  Very high body mass index, hypertension   Additional history obtained:  Additional history obtained from family and EMR External records from outside source obtained and reviewed including prior UC visit - visit to PCP on 1/19 for sinusitis  -   Lab Tests:  I Ordered, and personally interpreted labs.  The pertinent results include: A CBC showing a leukocytosis of  over 20,000, thankfully her lactate was 1.0 and the metabolic panel showed a creatinine of 1.1 without significant electrolyte abnormalities.  Her INR was 1.1, blood cultures were obtained.  COVID-negative   Imaging Studies ordered:  I ordered imaging studies including portable chest x-ray which showed a lobar pneumonia of the right lower lobe, this was followed with a CT angiogram I independently visualized and interpreted imaging which showed significant consolidation of the right lower lobe but no pulmonary embolism I agree with the radiologist interpretation   Cardiac Monitoring:  The patient was maintained on a cardiac monitor.  I personally viewed and interpreted the cardiac monitored which showed an underlying rhythm of: Sinus tachycardia rate of 115 or higher   Medicines ordered and prescription drug management:  I ordered medication including antibiotics for pneumonia and IV fluids at 30 cc/kg for septic shock Reevaluation of the patient after these medicines showed that the patient improved I have reviewed the patients home medicines and have made adjustments as needed    Critical Interventions:  Antibiotics IV fluids at 30 cc/kg Admission to the hospital, fluid resuscitation   Consultations Obtained:  I requested consultation with the hospitalist,  and discussed lab and imaging findings as well as pertinent plan - they recommend: Admission   Problem List / ED Course:  Sepsis, patient treated for pulmonary sepsis, antibiotics with fluid resuscitation for hypotension, blood pressures come up into the 90s and at this time is not requiring vasopressors.  She will need very high level of care, she is oxygenating at 90% on room air and comes up to 96% with supplemental oxygen   Reevaluation:  After the interventions noted above, I reevaluated  the patient and found that they have :improved   Dispostion:  After consideration of the diagnostic results and the patients response to treatment, I feel that the patent would benefit from admission to the hospital.   Alford Highland was evaluated in Emergency Department on 06/17/2021 for the symptoms described in the history of present illness. She was evaluated in the context of the global COVID-19 pandemic, which necessitated consideration that the patient might be at risk for infection with the SARS-CoV-2 virus that causes COVID-19. Institutional protocols and algorithms that pertain to the evaluation of patients at risk for COVID-19 are in a state of rapid change based on information released by regulatory bodies including the CDC and federal and state organizations. These policies and algorithms were followed during the patient's care in the ED.         Final Clinical Impression(s) / ED Diagnoses Final diagnoses:  Septic shock ([place])  Community acquired pneumonia of right lower lobe of lung     Noemi Chapel, MD 06/17/21 1816

## 2021-06-17 NOTE — ED Triage Notes (Signed)
Referral from urgent care for low oxygen sat

## 2021-06-17 NOTE — H&P (Signed)
[facility] H&P   Patient Demographics:    Teresa [name], is a 36 y.o. female  MRN: [id number]   DOB - 27-Feb-1986  Admit Date - 06/17/2021  Outpatient Primary MD for the patient is Coletta Memos, PA-C  Referring MD/NP/PA: Dr Sabra Heck     Patient coming from: home  Chief Complaint  Patient presents with   Shortness of Breath      HPI:    Teresa [name]  is a 36 y.o. female, with past medical history of hypertension, tobacco abuse, hypothyroidism, GERD, presents to ED secondary to complaints of shortness of breath, cough, patient reports she has been feeling sick for a few days, with dyspnea, cough, sinus congestion, she was started on Augmentin by PCP but for 3 days, without much improvement, her dyspnea has worsened, as well she is getting severely lightheaded, she reports fever and chills, she denies any hemoptysis, leg swelling, leg edema or COVID exposure, reports she tested negative for flu and COVID on Thursday when she saw her family doctor. - in ED she was noted to be tachypneic, tachycardic, and respiratory distress, CTA was negative for PE but significant for large right lung base opacity, she had significant leukocytosis at 20 2K, she was started on IV antibiotics and [facility] hospitalist consulted to admit.    Review of systems:    In addition to the HPI above,  A full 10 point Review of Systems was done, except as stated above, all other Review of Systems were negative.   With Past History of the following :    Past Medical History:  Diagnosis Date   Asthma    GERD (gastroesophageal reflux disease)    Hiatal hernia    Hypertension    Hypothyroidism    PVC (premature ventricular contraction)    Tobacco abuse       Past Surgical History:  Procedure Laterality Date   ABDOMINAL HYSTERECTOMY  08/15/2011   Procedure: HYSTERECTOMY ABDOMINAL;  Surgeon: Florian Buff, MD;   Location: [facility];  Service: Gynecology;  Laterality: N/A;   BIOPSY  07/26/2011   [id number] esophageal erosions consistent with erosive reflux esophagitis.  Patulous EG junction, status post passage of a Maloney dilator (empirically), status post esophageal biopsy  followed dilation/ A 3-4 cm hiatal hernia, otherwise normal stomach, D1, D2.   CESAREAN SECTION  2007 and 2011   CHOLECYSTECTOMY  2007   ESOPHAGOGASTRODUODENOSCOPY  2008   incomplete due to inadequate conscious sedation, circumferential distal esophageal erosions, hiatal hernia   MALONEY DILATION  07/26/2011   Procedure: MALONEY DILATION;  Surgeon: Daneil Dolin, MD;  Location: [facility];  Service: Endoscopy;  Laterality: N/A;  76 French Dilator   SALPINGOOPHORECTOMY  08/15/2011   Procedure: SALPINGO OOPHERECTOMY;  Surgeon: Florian Buff, MD;  Location: [facility];  Service: Gynecology;  Laterality: Bilateral;   TUBAL LIGATION  2011  Social History:     Social History   Tobacco Use   Smoking status: Former    Packs/day: 0.50    Years: 10.00    Pack years: 5.00    Types: Cigarettes    Start date: 07/06/2005   Smokeless tobacco: Never  Substance Use Topics   Alcohol use: No    Alcohol/week: 0.0 standard drinks        Family History :     Family History  Problem Relation Age of Onset   Ulcers Mother        Questionable history   Hypertension Mother    Heart failure Paternal Grandmother    Liver disease Neg Hx    Cancer Neg Hx        Colorectal cancer   Anesthesia problems Neg Hx    Hypotension Neg Hx    Malignant hyperthermia Neg Hx    Pseudochol deficiency Neg Hx       Home Medications:   Prior to Admission medications   Medication Sig Start Date End Date Taking? Authorizing Provider  albuterol (ACCUNEB) 0.63 MG/3ML nebulizer solution Take 1 ampule by nebulization every 6 (six) hours as needed for wheezing.    [provider]  ALPRAZolam Duanne Moron) 1 MG tablet Take 1 mg by mouth daily as needed for  anxiety. 08/25/19   [provider]  amoxicillin-clavulanate (AUGMENTIN XR) 1000-62.5 MG 12 hr tablet Augmentin XR 1,000 mg-62.5 mg tablet,extended release  Take 2 tablets every 12 hours by oral route.    [provider]  cetirizine (ZYRTEC ALLERGY) 10 MG tablet Take 1 tablet (10 mg total) by mouth daily. 04/07/20   Avegno, Darrelyn Hillock, FNP  cyproheptadine (PERIACTIN) 4 MG tablet Take 1 tablet (4 mg total) by mouth 3 (three) times daily as needed for allergies. And itching 04/13/20   Rodriguez-Southworth, Sunday Spillers, PA-C  dextromethorphan-guaiFENesin (ROBITUSSIN-DM) 10-100 MG/5ML liquid Tussin DM 10 mg-100 mg/5 mL oral syrup  Take 10 mL every 4 hours by oral route.    [provider]  gabapentin (NEURONTIN) 300 MG capsule See admin instructions. TAKE 2 CAPSULES BY MOUTH IN THE AFTERNOON AND 4 CAPSULES AT BEDTIME 09/06/19   [provider]  HYDROcodone bit-homatropine (HYCODAN) 5-1.5 MG/5ML syrup Hycodan (with homatropine) 5 mg-1.5 mg/5 mL oral syrup  Take 5 mL every 4 hours by oral route.    [provider]  levothyroxine (SYNTHROID) 100 MCG tablet Take 100 mcg by mouth daily. 07/03/19   [provider]  levothyroxine (SYNTHROID) 175 MCG tablet Take 175 mcg by mouth daily. 06/01/21   [provider]  lisinopril (ZESTRIL) 10 MG tablet Take 10 mg by mouth daily. 08/25/19   [provider]  montelukast (SINGULAIR) 10 MG tablet Take 10 mg by mouth daily. 09/11/19   [provider]  omeprazole (PRILOSEC) 40 MG capsule TAKE 1 CAPSULE(40 MG) BY MOUTH DAILY 03/28/20   O'Neal, Cassie Freer, MD  ondansetron (ZOFRAN-ODT) 8 MG disintegrating tablet ondansetron 8 mg disintegrating tablet  Place 1 tablet twice a day by translingual route as needed.    [provider]  predniSONE (STERAPRED UNI-PAK 21 TAB) 10 MG (21) TBPK tablet Take 6 tabs by mouth daily  for 1 days, then 5 tabs for 1 days, then 4 tabs for 1 days, then 3 tabs for 1  days, 2 tabs for 1 days, then 1 tab by mouth daily for 1 days 04/07/20   Emerson Monte, FNP  pregabalin (LYRICA) 50 MG capsule Take 50 mg  by mouth 3 (three) times daily. 06/01/21   [provider]  tiZANidine (ZANAFLEX) 4 MG tablet Take 4 mg by mouth 3 (three) times daily. 09/07/19   [provider]  triamcinolone (KENALOG) 0.1 % Apply 1 application topically 2 (two) times daily. For 7-10 04/13/20   Rodriguez-Southworth, Sunday Spillers, PA-C  ranitidine (ZANTAC) 150 MG tablet Take 150 mg by mouth 2 (two) times daily.  08/08/11  [provider]     Allergies:     Allergies  Allergen Reactions   Betadine [Povidone Iodine] Hives   Ceclor [Cefaclor] Hives   Hydrocodone Itching   Morphine And Related     PVCs and "it stops my heart" per patient.   Adhesive [Tape] Rash   Latex Rash     Physical Exam:   Vitals  Blood pressure (!) 98/52, pulse (!) 119, temperature 99.7 F (37.6 C), temperature source Oral, resp. rate (!) 22, weight 127 kg, last menstrual period 06/29/2011, SpO2 99 %.   1. General well developed female, in bed in no apparent distress  2. Normal affect and insight, Not Suicidal or Homicidal, Awake Alert, Oriented X 3.  3. No F.N deficits, ALL C.Nerves Intact, Strength 5/5 all 4 extremities, Sensation intact all 4 extremities, Plantars down going.  4. Ears and Eyes appear Normal, Conjunctivae clear, PERRLA. Moist Oral Mucosa.  5. Supple Neck, No JVD, No cervical lymphadenopathy appriciated, No Carotid Bruits.  6. Symmetrical Chest wall movement, mildly tachypneic, right lung base Rales  7.  Tachycardic, No Gallops, Rubs or Murmurs, No Parasternal Heave.  8. Positive Bowel Sounds, Abdomen Soft, No tenderness, No organomegaly appriciated,No rebound -guarding or rigidity.  9.  No Cyanosis, Normal Skin Turgor, No Skin Rash or Bruise.  10. Good muscle tone,  joints appear normal , no effusions, Normal ROM.  11. No Palpable Lymph Nodes in Neck or  Axillae     Data Review:    CBC Recent Labs  Lab 06/17/21 1640  WBC 22.7*  HGB 13.2  HCT 43.0  PLT 236  MCV 94.5  MCH 29.0  MCHC 30.7  RDW 13.2  LYMPHSABS 2.1  MONOABS 1.7*  EOSABS 0.1  BASOSABS 0.1   ------------------------------------------------------------------------------------------------------------------  Chemistries  Recent Labs  Lab 06/17/21 1640  NA 136  K 3.9  CL 101  CO2 27  GLUCOSE 113*  BUN 13  CREATININE 1.18*  CALCIUM 8.7*  AST 12*  ALT 14  ALKPHOS 81  BILITOT 0.6   ------------------------------------------------------------------------------------------------------------------ CrCl cannot be calculated (Unknown ideal weight.). ------------------------------------------------------------------------------------------------------------------ No results for input(s): TSH, T4TOTAL, T3FREE, THYROIDAB in the last 72 hours.  Invalid input(s): FREET3  Coagulation profile Recent Labs  Lab 06/17/21 1640  INR 1.1   ------------------------------------------------------------------------------------------------------------------- No results for input(s): DDIMER in the last 72 hours. -------------------------------------------------------------------------------------------------------------------  Cardiac Enzymes No results for input(s): CKMB, TROPONINI, MYOGLOBIN in the last 168 hours.  Invalid input(s): CK ------------------------------------------------------------------------------------------------------------------ No results found for: BNP   ---------------------------------------------------------------------------------------------------------------  Urinalysis    Component Value Date/Time   COLORURINE AMBER (A) 06/17/2021 1734   APPEARANCEUR CLOUDY (A) 06/17/2021 1734   LABSPEC 1.028 06/17/2021 1734   PHURINE 5.0 06/17/2021 1734   GLUCOSEU 50 (A) 06/17/2021 1734   HGBUR SMALL (A) 06/17/2021 1734   BILIRUBINUR SMALL (A)  06/17/2021 1734   KETONESUR 5 (A) 06/17/2021 1734   PROTEINUR 100 (A) 06/17/2021 1734   UROBILINOGEN 0.2 09/13/2013 0315   NITRITE NEGATIVE 06/17/2021 1734   LEUKOCYTESUR NEGATIVE 06/17/2021 1734    ----------------------------------------------------------------------------------------------------------------   Imaging Results:  CT Angio Chest PE W and/or Wo Contrast  Result Date: 06/17/2021 CLINICAL DATA:  PE suspected, cough, body aches, wheezing EXAM: CT ANGIOGRAPHY CHEST WITH CONTRAST TECHNIQUE: Multidetector CT imaging of the chest was performed using the standard protocol during bolus administration of intravenous contrast. Multiplanar CT image reconstructions and MIPs were obtained to evaluate the vascular anatomy. RADIATION DOSE REDUCTION: This exam was performed according to the departmental dose-optimization program which includes automated exposure control, adjustment of the mA and/or kV according to patient size and/or use of iterative reconstruction technique. CONTRAST:  OMNIPAQUE IOHEXOL 350 MG/ML SOLN COMPARISON:  None. FINDINGS: Cardiovascular: Satisfactory opacification of the pulmonary arteries to the segmental level. No evidence of pulmonary embolism. Normal heart size. No pericardial effusion. Mediastinum/Nodes: Numerous prominent mediastinal and hilar lymph nodes. Small hiatal hernia. Thyroid gland, trachea, and esophagus demonstrate no significant findings. Lungs/Pleura: Extensive, somewhat masslike heterogeneous airspace opacity and consolidation in the right lower lobe, largest component measuring approximately 6.8 x 6.7 cm (series 8, image 75). No pleural effusion or pneumothorax. Upper Abdomen: No acute abnormality.  Status post cholecystectomy. Musculoskeletal: No chest wall abnormality. No acute osseous findings. Review of the MIP images confirms the above findings. IMPRESSION: 1. Negative examination for pulmonary embolism. 2. Extensive, somewhat masslike  heterogeneous airspace opacity and consolidation in the right lower lobe, largest component measuring approximately 6.8 x 6.7 cm. Findings are most consistent with lobar pneumonia, however recommend radiographic follow-up in 6-8 weeks at the resolution of symptoms to ensure complete resolution and exclude underlying mass. 3. Numerous prominent mediastinal and hilar lymph nodes, likely reactive. 4. Small hiatal hernia. Electronically Signed   By: Jearld Lesch M.D.   On: 06/17/2021 17:54   DG Chest Port 1 View  Result Date: 06/17/2021 CLINICAL DATA:  Questionable sepsis, cough, wheezing EXAM: PORTABLE CHEST 1 VIEW COMPARISON:  Chest x-ray 08/28/2016 FINDINGS: Heart size is normal. Mediastinum appears stable. Large consolidative opacity in the right lower lobe measuring 7.5 x 7.1 cm. Mildly increased peribronchial opacities bilaterally which appear stable and chronic. No pleural effusion or pneumothorax visualized. IMPRESSION: Large consolidative opacity in the right lower lobe which likely represents pneumonia. Correlate clinically and follow-up is recommended to ensure resolution. Electronically Signed   By: Jannifer Hick M.D.   On: 06/17/2021 16:46       Assessment & Plan:    Principal Problem:   Pneumonia Active Problems:   GERD, SEVERE   Tobacco abuse  Community-acquired pneumonia -Patient presents with significant leukocytosis, tachypneic and tachycardic, blood pressure is soft. -CTA chest negative for PE. -She has significant right lower lobe pneumonia, admitted on the pneumonia pathway, given her allergy she will be kept on IV levofloxacin(she already failed outpatient Augmentin), follow sputum culture, Legionella antigen, strep pneumonia antigen, she is started on Mucinex as well, she was encouraged use incentive spirometer, flutter valve as well -Patient will need repeat imaging 6 to 8 weeks to ensure resolution of pneumonia  Hypothyroidism -Continue with home dose  Synthroid  Hypertension -Blood pressure is soft, hold home medications  GERD -Continue with PPI  Tobacco abuse -She was counseled, started on nicotine patch.   DVT Prophylaxis Heparin -  Lovenox   AM Labs Ordered, also please review Full Orders  Family Communication: Admission, patients condition and plan of care including tests being ordered have been discussed with the patient  who indicate understanding and agree with the plan and Code Status.  Code Status full  Likely DC to  home  Condition GUARDED  Consults called: none    Admission status:   inpatient  Time spent in minutes : 55 minutes   Phillips Climes M.D on 06/17/2021 at 6:48 PM   [facility] - Office  [phone]

## 2021-06-17 NOTE — ED Provider Notes (Signed)
Patient evaluated through triage.  Generally, patient reports that she has had fatigue, weakness and a syncopal episode.  She has been taking Augmentin for sinus infection but is not working.  Reports that she has had significant difficulty breathing.  Vitals:   06/17/21 1509  BP: 95/66  Pulse: (!) 122  Resp: (!) 22  Temp: 99.5 F (37.5 C)  SpO2: 90%   Vital signs are concerning for her tachycardia, hypotension and hypoxia.  High suspicion for an acute cardiopulmonary process including pneumonia, COVID-19, viral pneumonia, pulmonary embolism.  As such, patient is in need of higher level of care than we can provide in the urgent care setting including rule out of the after mentioned differential diagnoses as well as consideration for head CT scan and complete work-up for her syncopal episode.  She presents with her husband who contracts for safety and will take her to the emergency room now.   Wallis Bamberg, PA-C 06/17/21 1527

## 2021-06-17 NOTE — ED Triage Notes (Signed)
Pt reports feeling weak, cough, body aches, headache, wheezing x 4 days. Pt reports she felt confused when her husband wake her up yesterday. Pt started taking Augmentin on 06/15/21.  Per husband pt  was walking and fell and loose consciousness 2 days ago x 30 seconds.

## 2021-06-17 NOTE — Progress Notes (Signed)
[facility] following for sepsis protocol. 

## 2021-06-18 DIAGNOSIS — K219 Gastro-esophageal reflux disease without esophagitis: Secondary | ICD-10-CM

## 2021-06-18 DIAGNOSIS — E66813 Obesity, class 3: Secondary | ICD-10-CM | POA: Diagnosis present

## 2021-06-18 LAB — BASIC METABOLIC PANEL
Anion gap: 4 — ABNORMAL LOW (ref 5–15)
BUN: 8 mg/dL (ref 6–20)
CO2: 27 mmol/L (ref 22–32)
Calcium: 7.7 mg/dL — ABNORMAL LOW (ref 8.9–10.3)
Chloride: 106 mmol/L (ref 98–111)
Creatinine, Ser: 0.74 mg/dL (ref 0.44–1.00)
GFR, Estimated: >60 mL/min (ref >60)
Glucose, Bld: 127 mg/dL — ABNORMAL HIGH (ref 70–99)
Potassium: 4 mmol/L (ref 3.5–5.1)
Sodium: 137 mmol/L (ref 135–145)

## 2021-06-18 LAB — CBC
HCT: 33.8 % — ABNORMAL LOW (ref 36.0–46.0)
Hemoglobin: 10 g/dL — ABNORMAL LOW (ref 12.0–15.0)
MCH: 27.9 pg (ref 26.0–34.0)
MCHC: 29.6 g/dL — ABNORMAL LOW (ref 30.0–36.0)
MCV: 94.4 fL (ref 80.0–100.0)
Platelets: 205 10*3/uL (ref 150–400)
RBC: 3.58 MIL/uL — ABNORMAL LOW (ref 3.87–5.11)
RDW: 13.2 % (ref 11.5–15.5)
WBC: 21.8 10*3/uL — ABNORMAL HIGH (ref 4.0–10.5)
nRBC: 0 % (ref 0.0–0.2)

## 2021-06-18 LAB — HIV ANTIBODY (ROUTINE TESTING W REFLEX): HIV Screen 4th Generation wRfx: NONREACTIVE

## 2021-06-18 LAB — STREP PNEUMONIAE URINARY ANTIGEN: Strep Pneumo Urinary Antigen: NEGATIVE

## 2021-06-18 LAB — MRSA NEXT GEN BY PCR, NASAL: MRSA by PCR Next Gen: NOT DETECTED

## 2021-06-18 MED ORDER — IPRATROPIUM-ALBUTEROL 0.5-2.5 (3) MG/3ML IN SOLN
3.0000 mL | Freq: Four times a day (QID) | RESPIRATORY_TRACT | Status: DC
  Administered 2021-06-18 – 2021-06-19 (×5): 3 mL via RESPIRATORY_TRACT
  Filled 2021-06-18 (×5): qty 3

## 2021-06-18 MED ORDER — MORPHINE SULFATE (PF) 4 MG/ML IV SOLN: 4.0000 mg | Freq: Once | INTRAVENOUS | Status: DC

## 2021-06-18 MED ORDER — KETOROLAC TROMETHAMINE 30 MG/ML IJ SOLN
30.0000 mg | Freq: Four times a day (QID) | INTRAMUSCULAR | Status: DC | PRN
  Administered 2021-06-18 – 2021-06-19 (×4): 30 mg via INTRAVENOUS
  Filled 2021-06-18 (×4): qty 1

## 2021-06-18 MED ORDER — LACTATED RINGERS IV SOLN: INTRAVENOUS | Status: DC

## 2021-06-18 MED ORDER — LEVOFLOXACIN IN D5W 750 MG/150ML IV SOLN
750.0000 mg | INTRAVENOUS | Status: DC
  Administered 2021-06-18 – 2021-06-19 (×2): 750 mg via INTRAVENOUS
  Filled 2021-06-18 (×2): qty 150

## 2021-06-18 MED ORDER — LIDOCAINE 5 % EX PTCH
1.0000 | MEDICATED_PATCH | CUTANEOUS | Status: DC
  Administered 2021-06-18 (×2): 1 via TRANSDERMAL
  Filled 2021-06-18 (×5): qty 1

## 2021-06-18 MED ORDER — CHLORHEXIDINE GLUCONATE CLOTH 2 % EX PADS
6.0000 | MEDICATED_PAD | Freq: Every day | CUTANEOUS | Status: DC
  Administered 2021-06-18: 6 via TOPICAL

## 2021-06-18 MED ORDER — MONTELUKAST SODIUM 10 MG PO TABS
10.0000 mg | ORAL_TABLET | Freq: Every day | ORAL | Status: DC
  Administered 2021-06-18: 10 mg via ORAL
  Filled 2021-06-18: qty 1

## 2021-06-18 MED ORDER — ALBUTEROL SULFATE (2.5 MG/3ML) 0.083% IN NEBU
2.5000 mg | INHALATION_SOLUTION | RESPIRATORY_TRACT | Status: DC | PRN
  Administered 2021-06-18 – 2021-06-19 (×2): 2.5 mg via RESPIRATORY_TRACT
  Filled 2021-06-18 (×2): qty 3

## 2021-06-18 MED ORDER — OXYCODONE HCL 5 MG PO TABS
5.0000 mg | ORAL_TABLET | Freq: Once | ORAL | Status: AC
  Administered 2021-06-18: 5 mg via ORAL
  Filled 2021-06-18: qty 1

## 2021-06-18 NOTE — Progress Notes (Signed)
PROGRESS NOTE    Teresa Dareiffany D [name]  ZOX:[id number] DOB: July 03, 1985 DOA: 06/17/2021 PCP: Melina FiddlerMichaels, Chase A, PA-C    Brief Narrative:  36 year old female with a history of hypertension, hypothyroidism, GERD, class III obesity, presents to the emergency room with shortness of breath.  CT chest negative for pulmonary embolus, but does show right lobar pneumonia.  Patient was admitted to stepdown and started on intravenous antibiotics.   Assessment & Plan:   Principal Problem:   Pneumonia Active Problems:   GERD, SEVERE   Tobacco abuse   Obesity, Class III, BMI 40-49.9 (morbid obesity) (HCC)   Right lobar pneumonia -Currently on levofloxacin -Follow-up cultures -Continue pulmonary hygiene  Acute respiratory failure with hypoxia -Noted to be short of breath on admission with oxygen saturation of 88% on room air -Currently on supplemental oxygen  Asthma -Reports chronic history of asthma and uses albuterol/Singulair -Start the patient on nebulizer treatments  GERD -Continue PPI  Obesity, class III -BMI 47.6 -We will need lifestyle changes   DVT prophylaxis: SCDs Start: 06/17/21 1844  Code Status: Full code Family Communication: Daughter was at bedside Disposition Plan: Status is: Inpatient  Remains inpatient appropriate because: Continued management of pneumonia and respiratory failure         Consultants:    Procedures:    Antimicrobials:  Levofloxacin 1/21>   Subjective: She continues to have productive cough.  Complains of headache.  Says she has pain in her right chest when she coughs.  Objective: Vitals:   06/18/21 1400 06/18/21 1500 06/18/21 1600 06/18/21 1750  BP: 137/75 (!) 103/59 103/68   Pulse: (!) 111 (!) 104 81 97  Resp: (!) 23 20 16 12   Temp:      TempSrc:      SpO2: (!) 89% 100% 100%   Weight:      Height:        Intake/Output Summary (Last 24 hours) at 06/18/2021 1838 Last data filed at 06/18/2021 1700 Gross per 24 hour  Intake  4280.68 ml  Output 300 ml  Net 3980.68 ml   Filed Weights   06/17/21 1722 06/18/21 0431  Weight: 127 kg 129.9 kg    Examination:  General exam: Appears calm and comfortable  Respiratory system: Bilateral wheezing. Respiratory effort normal. Cardiovascular system: S1 & S2 heard, RRR. No JVD, murmurs, rubs, gallops or clicks. No pedal edema. Gastrointestinal system: Abdomen is nondistended, soft and nontender. No organomegaly or masses felt. Normal bowel sounds heard. Central nervous system: Alert and oriented. No focal neurological deficits. Extremities: Symmetric 5 x 5 power. Skin: No rashes, lesions or ulcers Psychiatry: Judgement and insight appear normal. Mood & affect appropriate.     Data Reviewed: I have personally reviewed following labs and imaging studies  CBC: Recent Labs  Lab 06/17/21 1640 06/18/21 0429  WBC 22.7* 21.8*  NEUTROABS 18.6*  --   HGB 13.2 10.0*  HCT 43.0 33.8*  MCV 94.5 94.4  PLT 236 205   Basic Metabolic Panel: Recent Labs  Lab 06/17/21 1640 06/18/21 0429  NA 136 137  K 3.9 4.0  CL 101 106  CO2 27 27  GLUCOSE 113* 127*  BUN 13 8  CREATININE 1.18* 0.74  CALCIUM 8.7* 7.7*   GFR: Estimated Creatinine Clearance: 134.8 mL/min (by C-G formula based on SCr of 0.74 mg/dL). Liver Function Tests: Recent Labs  Lab 06/17/21 1640  AST 12*  ALT 14  ALKPHOS 81  BILITOT 0.6  PROT 7.4  ALBUMIN 3.5   No results for input(s):  LIPASE, AMYLASE in the last 168 hours. No results for input(s): AMMONIA in the last 168 hours. Coagulation Profile: Recent Labs  Lab 06/17/21 1640  INR 1.1   Cardiac Enzymes: No results for input(s): CKTOTAL, CKMB, CKMBINDEX, TROPONINI in the last 168 hours. BNP (last 3 results) No results for input(s): PROBNP in the last 8760 hours. HbA1C: No results for input(s): HGBA1C in the last 72 hours. CBG: No results for input(s): GLUCAP in the last 168 hours. Lipid Profile: No results for input(s): CHOL, HDL,  LDLCALC, TRIG, CHOLHDL, LDLDIRECT in the last 72 hours. Thyroid Function Tests: No results for input(s): TSH, T4TOTAL, FREET4, T3FREE, THYROIDAB in the last 72 hours. Anemia Panel: No results for input(s): VITAMINB12, FOLATE, FERRITIN, TIBC, IRON, RETICCTPCT in the last 72 hours. Sepsis Labs: Recent Labs  Lab 06/17/21 1701 06/17/21 1852  LATICACIDVEN 1.0 1.2    Recent Results (from the past 240 hour(s))  Blood Culture (routine x 2)     Status: None (Preliminary result)   Collection Time: 06/17/21  4:23 PM   Specimen: Left Antecubital; Blood  Result Value Ref Range Status   Specimen Description   Final    LEFT ANTECUBITAL BOTTLES DRAWN AEROBIC AND ANAEROBIC   Special Requests Blood Culture adequate volume  Final   Culture   Final    NO GROWTH < 24 HOURS Performed at [facility], [address] [place], [place] [address]    Report Status PENDING  Incomplete  Resp Panel by RT-PCR (Flu A&B, Covid) Nasopharyngeal Swab     Status: None   Collection Time: 06/17/21  4:44 PM   Specimen: Nasopharyngeal Swab; Nasopharyngeal(NP) swabs in vial transport medium  Result Value Ref Range Status   SARS Coronavirus 2 by RT PCR NEGATIVE NEGATIVE Final    Comment: (NOTE) SARS-CoV-2 target nucleic acids are NOT DETECTED.  The SARS-CoV-2 RNA is generally detectable in upper respiratory specimens during the acute phase of infection. The lowest concentration of SARS-CoV-2 viral copies this assay can detect is 138 copies/mL. A negative result does not preclude SARS-Cov-2 infection and should not be used as the sole basis for treatment or other patient management decisions. A negative result may occur with  improper specimen collection/handling, submission of specimen other than nasopharyngeal swab, presence of viral mutation(s) within the areas targeted by this assay, and inadequate number of viral copies(<138 copies/mL). A negative result must be combined with clinical observations, patient  history, and epidemiological information. The expected result is Negative.  Fact Sheet for Patients:  BloggerCourse.comhttps://www.fda.gov/media/152166/download  Fact Sheet for Healthcare Providers:  SeriousBroker.ithttps://www.fda.gov/media/152162/download  This test is no t yet approved or cleared by the [place] FDA and  has been authorized for detection and/or diagnosis of SARS-CoV-2 by FDA under an Emergency Use Authorization (EUA). This EUA will remain  in effect (meaning this test can be used) for the duration of the COVID-19 declaration under Section 564(b)(1) of the Act, 21 U.S.C.section 360bbb-3(b)(1), unless the authorization is terminated  or revoked sooner.       Influenza A by PCR NEGATIVE NEGATIVE Final   Influenza B by PCR NEGATIVE NEGATIVE Final    Comment: (NOTE) The Xpert Xpress SARS-CoV-2/FLU/RSV plus assay is intended as an aid in the diagnosis of influenza from Nasopharyngeal swab specimens and should not be used as a sole basis for treatment. Nasal washings and aspirates are unacceptable for Xpert Xpress SARS-CoV-2/FLU/RSV testing.  Fact Sheet for Patients: BloggerCourse.comhttps://www.fda.gov/media/152166/download  Fact Sheet for Healthcare Providers: SeriousBroker.ithttps://www.fda.gov/media/152162/download  This test is not yet  approved or cleared by the [place] and has been authorized for detection and/or diagnosis of SARS-CoV-2 by FDA under an Emergency Use Authorization (EUA). This EUA will remain in effect (meaning this test can be used) for the duration of the COVID-19 declaration under Section 564(b)(1) of the Act, 21 U.S.C. section 360bbb-3(b)(1), unless the authorization is terminated or revoked.  Performed at [facility], [address] [place], [place] [address]   Blood Culture (routine x 2)     Status: None (Preliminary result)   Collection Time: 06/17/21  4:49 PM   Specimen: Right Antecubital; Blood  Result Value Ref Range Status   Specimen Description   Final    RIGHT  ANTECUBITAL BOTTLES DRAWN AEROBIC AND ANAEROBIC   Special Requests Blood Culture adequate volume  Final   Culture   Final    NO GROWTH < 24 HOURS Performed at [facility], [address] [place], [place] [address]    Report Status PENDING  Incomplete  MRSA Next Gen by PCR, Nasal     Status: None   Collection Time: 06/17/21 10:00 PM   Specimen: Nasal Mucosa; Nasal Swab  Result Value Ref Range Status   MRSA by PCR Next Gen NOT DETECTED NOT DETECTED Final    Comment: (NOTE) The GeneXpert MRSA Assay (FDA approved for NASAL specimens only), is one component of a comprehensive MRSA colonization surveillance program. It is not intended to diagnose MRSA infection nor to guide or monitor treatment for MRSA infections. Test performance is not FDA approved in patients less than 58 years old. Performed at [facility], [address] [place], [place] [address]          Radiology Studies: CT Angio Chest PE W and/or Wo Contrast  Result Date: 06/17/2021 CLINICAL DATA:  PE suspected, cough, body aches, wheezing EXAM: CT ANGIOGRAPHY CHEST WITH CONTRAST TECHNIQUE: Multidetector CT imaging of the chest was performed using the standard protocol during bolus administration of intravenous contrast. Multiplanar CT image reconstructions and MIPs were obtained to evaluate the vascular anatomy. RADIATION DOSE REDUCTION: This exam was performed according to the departmental dose-optimization program which includes automated exposure control, adjustment of the mA and/or kV according to patient size and/or use of iterative reconstruction technique. CONTRAST:  OMNIPAQUE IOHEXOL 350 MG/ML SOLN COMPARISON:  None. FINDINGS: Cardiovascular: Satisfactory opacification of the pulmonary arteries to the segmental level. No evidence of pulmonary embolism. Normal heart size. No pericardial effusion. Mediastinum/Nodes: Numerous prominent mediastinal and hilar lymph nodes. Small hiatal hernia. Thyroid gland, trachea, and  esophagus demonstrate no significant findings. Lungs/Pleura: Extensive, somewhat masslike heterogeneous airspace opacity and consolidation in the right lower lobe, largest component measuring approximately 6.8 x 6.7 cm (series 8, image 75). No pleural effusion or pneumothorax. Upper Abdomen: No acute abnormality.  Status post cholecystectomy. Musculoskeletal: No chest wall abnormality. No acute osseous findings. Review of the MIP images confirms the above findings. IMPRESSION: 1. Negative examination for pulmonary embolism. 2. Extensive, somewhat masslike heterogeneous airspace opacity and consolidation in the right lower lobe, largest component measuring approximately 6.8 x 6.7 cm. Findings are most consistent with lobar pneumonia, however recommend radiographic follow-up in 6-8 weeks at the resolution of symptoms to ensure complete resolution and exclude underlying mass. 3. Numerous prominent mediastinal and hilar lymph nodes, likely reactive. 4. Small hiatal hernia. Electronically Signed   By: Jearld Lesch M.D.   On: 06/17/2021 17:54   DG Chest Port 1 View  Result Date: 06/17/2021 CLINICAL DATA:  Questionable sepsis, cough, wheezing  EXAM: PORTABLE CHEST 1 VIEW COMPARISON:  Chest x-ray 08/28/2016 FINDINGS: Heart size is normal. Mediastinum appears stable. Large consolidative opacity in the right lower lobe measuring 7.5 x 7.1 cm. Mildly increased peribronchial opacities bilaterally which appear stable and chronic. No pleural effusion or pneumothorax visualized. IMPRESSION: Large consolidative opacity in the right lower lobe which likely represents pneumonia. Correlate clinically and follow-up is recommended to ensure resolution. Electronically Signed   By: Jannifer Hick M.D.   On: 06/17/2021 16:46        Scheduled Meds:  Chlorhexidine Gluconate Cloth  6 each Topical Daily   enoxaparin (LOVENOX) injection  60 mg Subcutaneous Q24H   guaiFENesin  1,200 mg Oral BID   ipratropium-albuterol  3 mL  Nebulization Q6H   levothyroxine  175 mcg Oral Daily   lidocaine  1 patch Transdermal Q24H   montelukast  10 mg Oral Daily   nicotine  21 mg Transdermal Daily   pantoprazole  40 mg Oral Daily   Continuous Infusions:  lactated ringers 75 mL/hr at 06/18/21 1702   levofloxacin (LEVAQUIN) IV 750 mg (06/18/21 1519)     LOS: 1 day    Time spent:    Erick Blinks, MD [facility]   If 7PM-7AM, please contact night-coverage [email or web address]  06/18/2021, 6:38 PM

## 2021-06-19 ENCOUNTER — Inpatient Hospital Stay (HOSPITAL_COMMUNITY): Payer: Medicaid Other

## 2021-06-19 DIAGNOSIS — G4733 Obstructive sleep apnea (adult) (pediatric): Secondary | ICD-10-CM

## 2021-06-19 LAB — LEGIONELLA PNEUMOPHILA SEROGP 1 UR AG: L. pneumophila Serogp 1 Ur Ag: NEGATIVE

## 2021-06-19 LAB — COMPREHENSIVE METABOLIC PANEL
ALT: 13 U/L (ref 0–44)
AST: 9 U/L — ABNORMAL LOW (ref 15–41)
Albumin: 2.7 g/dL — ABNORMAL LOW (ref 3.5–5.0)
Alkaline Phosphatase: 79 U/L (ref 38–126)
Anion gap: 6 (ref 5–15)
BUN: 7 mg/dL (ref 6–20)
CO2: 26 mmol/L (ref 22–32)
Calcium: 8.5 mg/dL — ABNORMAL LOW (ref 8.9–10.3)
Chloride: 103 mmol/L (ref 98–111)
Creatinine, Ser: 0.54 mg/dL (ref 0.44–1.00)
GFR, Estimated: >60 mL/min (ref >60)
Glucose, Bld: 140 mg/dL — ABNORMAL HIGH (ref 70–99)
Potassium: 4 mmol/L (ref 3.5–5.1)
Sodium: 135 mmol/L (ref 135–145)
Total Bilirubin: 0.2 mg/dL — ABNORMAL LOW (ref 0.3–1.2)
Total Protein: 6.3 g/dL — ABNORMAL LOW (ref 6.5–8.1)

## 2021-06-19 LAB — URINE CULTURE: Culture: NO GROWTH

## 2021-06-19 LAB — CBC
HCT: 31.5 % — ABNORMAL LOW (ref 36.0–46.0)
Hemoglobin: 9.7 g/dL — ABNORMAL LOW (ref 12.0–15.0)
MCH: 29.1 pg (ref 26.0–34.0)
MCHC: 30.8 g/dL (ref 30.0–36.0)
MCV: 94.6 fL (ref 80.0–100.0)
Platelets: 180 10*3/uL (ref 150–400)
RBC: 3.33 MIL/uL — ABNORMAL LOW (ref 3.87–5.11)
RDW: 13.2 % (ref 11.5–15.5)
WBC: 11.6 10*3/uL — ABNORMAL HIGH (ref 4.0–10.5)
nRBC: 0 % (ref 0.0–0.2)

## 2021-06-19 MED ORDER — GUAIFENESIN ER 600 MG PO TB12: 600.0000 mg | ORAL_TABLET | Freq: Two times a day (BID) | ORAL | 0 refills | Status: DC

## 2021-06-19 MED ORDER — BUDESONIDE 0.25 MG/2ML IN SUSP
0.2500 mg | Freq: Two times a day (BID) | RESPIRATORY_TRACT | Status: DC
  Administered 2021-06-19: 0.25 mg via RESPIRATORY_TRACT
  Filled 2021-06-19: qty 2

## 2021-06-19 MED ORDER — METOCLOPRAMIDE HCL 5 MG/ML IJ SOLN
10.0000 mg | Freq: Once | INTRAMUSCULAR | Status: AC
  Administered 2021-06-19: 10 mg via INTRAVENOUS
  Filled 2021-06-19: qty 2

## 2021-06-19 MED ORDER — METHYLPREDNISOLONE SODIUM SUCC 40 MG IJ SOLR
40.0000 mg | Freq: Two times a day (BID) | INTRAMUSCULAR | Status: DC
  Administered 2021-06-19: 40 mg via INTRAVENOUS
  Filled 2021-06-19: qty 1

## 2021-06-19 MED ORDER — TIZANIDINE HCL 4 MG PO TABS: 4.0000 mg | ORAL_TABLET | Freq: Three times a day (TID) | ORAL | 0 refills | Status: AC | PRN

## 2021-06-19 MED ORDER — ALBUTEROL SULFATE (2.5 MG/3ML) 0.083% IN NEBU: 2.5000 mg | INHALATION_SOLUTION | RESPIRATORY_TRACT | 2 refills | Status: AC | PRN

## 2021-06-19 MED ORDER — LEVOFLOXACIN 750 MG PO TABS: 750.0000 mg | ORAL_TABLET | Freq: Every day | ORAL | 0 refills | Status: AC

## 2021-06-19 MED ORDER — ONDANSETRON HCL 4 MG/2ML IJ SOLN
4.0000 mg | Freq: Four times a day (QID) | INTRAMUSCULAR | Status: DC | PRN
  Administered 2021-06-19: 4 mg via INTRAVENOUS
  Filled 2021-06-19: qty 2

## 2021-06-19 NOTE — Progress Notes (Signed)
Patient ambulated around unit three times on room air with O2 sats staying 90-91%. Dr Kerry Hough made aware. Patient denied feeling short of breath.

## 2021-06-19 NOTE — Discharge Summary (Signed)
Physician Discharge Summary  Teresa Dareiffany D [name] WUJ:[id number] DOB: 18-Feb-1986 DOA: 06/17/2021  PCP: Marshia LyMichaels, Chase A, PA-C  Admit date: 06/17/2021 Discharge date: 06/19/2021  Admitted From: Home Disposition: Home  Recommendations for Outpatient Follow-up:  Follow up with PCP in 1-2 weeks Please obtain BMP/CBC in one week Will need repeat two-view chest x-ray in 3 to 4 weeks to ensure resolution of pneumonia She has been referred to pulmonology for sleep study  Home Health: Equipment/Devices: Nebulizer machine  Discharge Condition: Stable CODE STATUS: Full code Diet recommendation: Heart healthy  Brief/Interim Summary: 36 year old female with a history of hypertension, hypothyroidism, GERD, class III obesity, presents to the emergency room with shortness of breath.  CT chest negative for pulmonary embolus, but does show right lobar pneumonia.  Patient was admitted to stepdown and started on intravenous antibiotics.  Clinically she has improved.  She has been able to come off of oxygen.  WBC count has significantly improved and she is no longer febrile.  Discharge Diagnoses:  Principal Problem:   Pneumonia Active Problems:   GERD, SEVERE   Tobacco abuse   Obesity, Class III, BMI 40-49.9 (morbid obesity) (HCC)  Right lobar pneumonia -Currently on levofloxacin -Blood cultures without any growth -Continue pulmonary hygiene -Clinically improving -Levofloxacin transition to oral -We will need repeat two-view chest x-ray in 3 to 4 weeks to ensure resolution of pneumonia   Acute respiratory failure with hypoxia -Noted to be short of breath on admission with oxygen saturation of 88% on room air -She is requiring 2 L of oxygen -Currently weaned off and is breathing comfortably on room air   Asthma -Reports chronic history of asthma and uses albuterol/Singulair -Start the patient on nebulizer treatments -Continue albuterol on discharge   GERD -Continue PPI   Obesity, class  III -BMI 47.6 -Will need lifestyle changes -Referral to pulmonology for sleep study for suspected sleep apnea  Discharge Instructions  Discharge Instructions     Ambulatory referral to Pulmonology   Complete by: As directed    Obstructive sleep apnea   Reason for referral: Other   Diet - low sodium heart healthy   Complete by: As directed    Increase activity slowly   Complete by: As directed       Allergies as of 06/19/2021       Reactions   Betadine [povidone Iodine] Hives   Ceclor [cefaclor] Hives   Hydrocodone Itching   Morphine And Related    PVCs and "it stops my heart" per patient.   Adhesive [tape] Rash   Latex Rash        Medication List     STOP taking these medications    albuterol 0.63 MG/3ML nebulizer solution Commonly known as: ACCUNEB Replaced by: albuterol (2.5 MG/3ML) 0.083% nebulizer solution   amoxicillin-clavulanate 1000-62.5 MG 12 hr tablet Commonly known as: AUGMENTIN XR   cyproheptadine 4 MG tablet Commonly known as: PERIACTIN   predniSONE 10 MG (21) Tbpk tablet Commonly known as: STERAPRED UNI-PAK 21 TAB   triamcinolone cream 0.1 % Commonly known as: KENALOG       TAKE these medications    albuterol (2.5 MG/3ML) 0.083% nebulizer solution Commonly known as: PROVENTIL Take 3 mLs (2.5 mg total) by nebulization every 4 (four) hours as needed for wheezing or shortness of breath. Replaces: albuterol 0.63 MG/3ML nebulizer solution   ALPRAZolam 1 MG tablet Commonly known as: XANAX Take 1 mg by mouth daily as needed for anxiety.   cetirizine 10 MG tablet Commonly known as:  ZyrTEC Allergy Take 1 tablet (10 mg total) by mouth daily.   guaiFENesin 600 MG 12 hr tablet Commonly known as: MUCINEX Take 1 tablet (600 mg total) by mouth 2 (two) times daily.   levofloxacin 750 MG tablet Commonly known as: Levaquin Take 1 tablet (750 mg total) by mouth daily for 7 days.   levothyroxine 175 MCG tablet Commonly known as:  SYNTHROID Take 175 mcg by mouth daily.   montelukast 10 MG tablet Commonly known as: SINGULAIR Take 10 mg by mouth daily.   omeprazole 40 MG capsule Commonly known as: PRILOSEC TAKE 1 CAPSULE(40 MG) BY MOUTH DAILY   ondansetron 8 MG disintegrating tablet Commonly known as: ZOFRAN-ODT ondansetron 8 mg disintegrating tablet  Place 1 tablet twice a day by translingual route as needed.   pregabalin 50 MG capsule Commonly known as: LYRICA Take 50 mg by mouth 3 (three) times daily.   tiZANidine 4 MG tablet Commonly known as: ZANAFLEX Take 1 tablet (4 mg total) by mouth every 8 (eight) hours as needed for muscle spasms. What changed:  when to take this reasons to take this               Durable Medical Equipment  (From admission, onward)           Start     Ordered   06/19/21 1647  For home use only DME Nebulizer machine  Once       Question Answer Comment  Patient needs a nebulizer to treat with the following condition Pneumonia   Length of Need 6 Months      06/19/21 1646            Follow-up Information     Malen Gauze A, PA-C Follow up.   Specialty: General Practice Why: follow up with pcp as scheduled you will need repeat 2-view chest xray in 3-4 weeks to ensure resolution of pneumonia Contact information: [address] [place] [address] [phone]                Allergies  Allergen Reactions   Betadine [Povidone Iodine] Hives   Ceclor [Cefaclor] Hives   Hydrocodone Itching   Morphine And Related     PVCs and "it stops my heart" per patient.   Adhesive [Tape] Rash   Latex Rash    Consultations:    Procedures/Studies: CT Angio Chest PE W and/or Wo Contrast  Result Date: 06/17/2021 CLINICAL DATA:  PE suspected, cough, body aches, wheezing EXAM: CT ANGIOGRAPHY CHEST WITH CONTRAST TECHNIQUE: Multidetector CT imaging of the chest was performed using the standard protocol during bolus administration of intravenous contrast.  Multiplanar CT image reconstructions and MIPs were obtained to evaluate the vascular anatomy. RADIATION DOSE REDUCTION: This exam was performed according to the departmental dose-optimization program which includes automated exposure control, adjustment of the mA and/or kV according to patient size and/or use of iterative reconstruction technique. CONTRAST:  OMNIPAQUE IOHEXOL 350 MG/ML SOLN COMPARISON:  None. FINDINGS: Cardiovascular: Satisfactory opacification of the pulmonary arteries to the segmental level. No evidence of pulmonary embolism. Normal heart size. No pericardial effusion. Mediastinum/Nodes: Numerous prominent mediastinal and hilar lymph nodes. Small hiatal hernia. Thyroid gland, trachea, and esophagus demonstrate no significant findings. Lungs/Pleura: Extensive, somewhat masslike heterogeneous airspace opacity and consolidation in the right lower lobe, largest component measuring approximately 6.8 x 6.7 cm (series 8, image 75). No pleural effusion or pneumothorax. Upper Abdomen: No acute abnormality.  Status post cholecystectomy. Musculoskeletal: No chest wall abnormality. No  acute osseous findings. Review of the MIP images confirms the above findings. IMPRESSION: 1. Negative examination for pulmonary embolism. 2. Extensive, somewhat masslike heterogeneous airspace opacity and consolidation in the right lower lobe, largest component measuring approximately 6.8 x 6.7 cm. Findings are most consistent with lobar pneumonia, however recommend radiographic follow-up in 6-8 weeks at the resolution of symptoms to ensure complete resolution and exclude underlying mass. 3. Numerous prominent mediastinal and hilar lymph nodes, likely reactive. 4. Small hiatal hernia. Electronically Signed   By: Jearld Lesch M.D.   On: 06/17/2021 17:54   DG CHEST PORT 1 VIEW  Result Date: 06/19/2021 CLINICAL DATA:  36 year old female with history of pneumonia. Former smoker. EXAM: PORTABLE CHEST 1 VIEW COMPARISON:   Chest x-ray 06/17/2021. FINDINGS: Lung volumes are slightly low. Worsening airspace consolidation throughout the right mid to lower lung. Small right pleural effusion. Left lung is clear. No evidence of pulmonary edema. Heart size is normal. The patient is rotated to the left on today's exam, resulting in distortion of the mediastinal contours and reduced diagnostic sensitivity and specificity for mediastinal pathology. IMPRESSION: 1. Worsening right-sided multilobar pneumonia involving the right middle and lower lobes with small right parapneumonic pleural effusion. Followup PA and lateral chest X-ray is recommended in 3-4 weeks following trial of antibiotic therapy to ensure resolution and exclude underlying malignancy. Electronically Signed   By: Trudie Reed M.D.   On: 06/19/2021 07:46   DG Chest Port 1 View  Result Date: 06/17/2021 CLINICAL DATA:  Questionable sepsis, cough, wheezing EXAM: PORTABLE CHEST 1 VIEW COMPARISON:  Chest x-ray 08/28/2016 FINDINGS: Heart size is normal. Mediastinum appears stable. Large consolidative opacity in the right lower lobe measuring 7.5 x 7.1 cm. Mildly increased peribronchial opacities bilaterally which appear stable and chronic. No pleural effusion or pneumothorax visualized. IMPRESSION: Large consolidative opacity in the right lower lobe which likely represents pneumonia. Correlate clinically and follow-up is recommended to ensure resolution. Electronically Signed   By: Jannifer Hick M.D.   On: 06/17/2021 16:46      Subjective: Feels better.  Feels as though her cough is more productive.  She is able to ambulate on room air without difficulty.  She is eager to go home.  Discharge Exam: Vitals:   06/19/21 1111 06/19/21 1130 06/19/21 1220 06/19/21 1319  BP:      Pulse:      Resp:      Temp:  98.2 F (36.8 C)    TempSrc:  Oral    SpO2: 99%  92% 95%  Weight:      Height:        General: Pt is alert, awake, not in acute distress Cardiovascular:  RRR, S1/S2 +, no rubs, no gallops Respiratory: CTA bilaterally, no wheezing, no rhonchi Abdominal: Soft, NT, ND, bowel sounds + Extremities: no edema, no cyanosis    The results of significant diagnostics from this hospitalization (including imaging, microbiology, ancillary and laboratory) are listed below for reference.     Microbiology: Recent Results (from the past 240 hour(s))  Blood Culture (routine x 2)     Status: None (Preliminary result)   Collection Time: 06/17/21  4:23 PM   Specimen: Left Antecubital; Blood  Result Value Ref Range Status   Specimen Description   Final    LEFT ANTECUBITAL BOTTLES DRAWN AEROBIC AND ANAEROBIC   Special Requests Blood Culture adequate volume  Final   Culture   Final    NO GROWTH 2 DAYS Performed at [facility], [address] [place] [address], [place], [place] [address]    Report Status PENDING  Incomplete  Resp Panel by RT-PCR (Flu A&B, Covid) Nasopharyngeal Swab     Status: None   Collection Time: 06/17/21  4:44 PM   Specimen: Nasopharyngeal Swab; Nasopharyngeal(NP) swabs in vial transport medium  Result Value Ref Range Status   SARS Coronavirus 2 by RT PCR NEGATIVE NEGATIVE Final    Comment: (NOTE) SARS-CoV-2 target nucleic acids are NOT DETECTED.  The SARS-CoV-2 RNA is generally detectable in upper respiratory specimens during the acute phase of infection. The lowest concentration of SARS-CoV-2 viral copies this assay can detect is 138 copies/mL. A negative result does not preclude SARS-Cov-2 infection and should not be used as the sole basis for treatment or other patient management decisions. A negative result may occur with  improper specimen collection/handling, submission of specimen other than nasopharyngeal swab, presence of viral mutation(s) within the areas targeted by this assay, and inadequate number of viral copies(<138 copies/mL). A negative result must be combined with clinical observations, patient history, and  epidemiological information. The expected result is Negative.  Fact Sheet for Patients:  BloggerCourse.com  Fact Sheet for Healthcare Providers:  SeriousBroker.it  This test is no t yet approved or cleared by the [place] FDA and  has been authorized for detection and/or diagnosis of SARS-CoV-2 by FDA under an Emergency Use Authorization (EUA). This EUA will remain  in effect (meaning this test can be used) for the duration of the COVID-19 declaration under Section 564(b)(1) of the Act, 21 U.S.C.section 360bbb-3(b)(1), unless the authorization is terminated  or revoked sooner.       Influenza A by PCR NEGATIVE NEGATIVE Final   Influenza B by PCR NEGATIVE NEGATIVE Final    Comment: (NOTE) The Xpert Xpress SARS-CoV-2/FLU/RSV plus assay is intended as an aid in the diagnosis of influenza from Nasopharyngeal swab specimens and should not be used as a sole basis for treatment. Nasal washings and aspirates are unacceptable for Xpert Xpress SARS-CoV-2/FLU/RSV testing.  Fact Sheet for Patients: BloggerCourse.com  Fact Sheet for Healthcare Providers: SeriousBroker.it  This test is not yet approved or cleared by the [place] FDA and has been authorized for detection and/or diagnosis of SARS-CoV-2 by FDA under an Emergency Use Authorization (EUA). This EUA will remain in effect (meaning this test can be used) for the duration of the COVID-19 declaration under Section 564(b)(1) of the Act, 21 U.S.C. section 360bbb-3(b)(1), unless the authorization is terminated or revoked.  Performed at [facility], [address], [place], [place] [address]   Blood Culture (routine x 2)     Status: None (Preliminary result)   Collection Time: 06/17/21  4:49 PM   Specimen: Right Antecubital; Blood  Result Value Ref Range Status   Specimen Description   Final    RIGHT ANTECUBITAL BOTTLES  DRAWN AEROBIC AND ANAEROBIC   Special Requests Blood Culture adequate volume  Final   Culture   Final    NO GROWTH 2 DAYS Performed at [facility], [address] [place], [place] [address]    Report Status PENDING  Incomplete  Urine Culture     Status: None   Collection Time: 06/17/21  5:34 PM   Specimen: In/Out Cath Urine  Result Value Ref Range Status   Specimen Description   Final    IN/OUT CATH URINE Performed at [facility], [address] [place], [place] [address]    Special Requests   Final    NONE Performed at [facility],  [address], [place], [place] [address]    Culture   Final    NO GROWTH Performed at [facility], [address], [place], [place] [address]    Report Status 06/19/2021 FINAL  Final  MRSA Next Gen by PCR, Nasal     Status: None   Collection Time: 06/17/21 10:00 PM   Specimen: Nasal Mucosa; Nasal Swab  Result Value Ref Range Status   MRSA by PCR Next Gen NOT DETECTED NOT DETECTED Final    Comment: (NOTE) The GeneXpert MRSA Assay (FDA approved for NASAL specimens only), is one component of a comprehensive MRSA colonization surveillance program. It is not intended to diagnose MRSA infection nor to guide or monitor treatment for MRSA infections. Test performance is not FDA approved in patients less than 40 years old. Performed at [facility], [address] [place], [place] [address]      Labs: BNP (last 3 results) No results for input(s): BNP in the last 8760 hours. Basic Metabolic Panel: Recent Labs  Lab 06/17/21 1640 06/18/21 0429 06/19/21 0554  NA 136 137 135  K 3.9 4.0 4.0  CL 101 106 103  CO2 27 27 26   GLUCOSE 113* 127* 140*  BUN 13 8 7   CREATININE 1.18* 0.74 0.54  CALCIUM 8.7* 7.7* 8.5*   Liver Function Tests: Recent Labs  Lab 06/17/21 1640 06/19/21 0554  AST 12* 9*  ALT 14 13  ALKPHOS 81 79  BILITOT 0.6 0.2*  PROT 7.4 6.3*  ALBUMIN 3.5 2.7*   No results for input(s): LIPASE, AMYLASE in the last 168  hours. No results for input(s): AMMONIA in the last 168 hours. CBC: Recent Labs  Lab 06/17/21 1640 06/18/21 0429 06/19/21 0554  WBC 22.7* 21.8* 11.6*  NEUTROABS 18.6*  --   --   HGB 13.2 10.0* 9.7*  HCT 43.0 33.8* 31.5*  MCV 94.5 94.4 94.6  PLT 236 205 180   Cardiac Enzymes: No results for input(s): CKTOTAL, CKMB, CKMBINDEX, TROPONINI in the last 168 hours. BNP: Invalid input(s): POCBNP CBG: No results for input(s): GLUCAP in the last 168 hours. D-Dimer No results for input(s): DDIMER in the last 72 hours. Hgb A1c No results for input(s): HGBA1C in the last 72 hours. Lipid Profile No results for input(s): CHOL, HDL, LDLCALC, TRIG, CHOLHDL, LDLDIRECT in the last 72 hours. Thyroid function studies No results for input(s): TSH, T4TOTAL, T3FREE, THYROIDAB in the last 72 hours.  Invalid input(s): FREET3 Anemia work up No results for input(s): VITAMINB12, FOLATE, FERRITIN, TIBC, IRON, RETICCTPCT in the last 72 hours. Urinalysis    Component Value Date/Time   COLORURINE AMBER (A) 06/17/2021 1734   APPEARANCEUR CLOUDY (A) 06/17/2021 1734   LABSPEC 1.028 06/17/2021 1734   PHURINE 5.0 06/17/2021 1734   GLUCOSEU 50 (A) 06/17/2021 1734   HGBUR SMALL (A) 06/17/2021 1734   BILIRUBINUR SMALL (A) 06/17/2021 1734   KETONESUR 5 (A) 06/17/2021 1734   PROTEINUR 100 (A) 06/17/2021 1734   UROBILINOGEN 0.2 09/13/2013 0315   NITRITE NEGATIVE 06/17/2021 1734   LEUKOCYTESUR NEGATIVE 06/17/2021 1734   Sepsis Labs Invalid input(s): PROCALCITONIN,  WBC,  LACTICIDVEN Microbiology Recent Results (from the past 240 hour(s))  Blood Culture (routine x 2)     Status: None (Preliminary result)   Collection Time: 06/17/21  4:23 PM   Specimen: Left Antecubital; Blood  Result Value Ref Range Status   Specimen Description   Final    LEFT ANTECUBITAL BOTTLES DRAWN AEROBIC AND ANAEROBIC   Special Requests  Blood Culture adequate volume  Final   Culture   Final    NO GROWTH 2 DAYS Performed at  [facility], [address], [place], [place] [address]    Report Status PENDING  Incomplete  Resp Panel by RT-PCR (Flu A&B, Covid) Nasopharyngeal Swab     Status: None   Collection Time: 06/17/21  4:44 PM   Specimen: Nasopharyngeal Swab; Nasopharyngeal(NP) swabs in vial transport medium  Result Value Ref Range Status   SARS Coronavirus 2 by RT PCR NEGATIVE NEGATIVE Final    Comment: (NOTE) SARS-CoV-2 target nucleic acids are NOT DETECTED.  The SARS-CoV-2 RNA is generally detectable in upper respiratory specimens during the acute phase of infection. The lowest concentration of SARS-CoV-2 viral copies this assay can detect is 138 copies/mL. A negative result does not preclude SARS-Cov-2 infection and should not be used as the sole basis for treatment or other patient management decisions. A negative result may occur with  improper specimen collection/handling, submission of specimen other than nasopharyngeal swab, presence of viral mutation(s) within the areas targeted by this assay, and inadequate number of viral copies(<138 copies/mL). A negative result must be combined with clinical observations, patient history, and epidemiological information. The expected result is Negative.  Fact Sheet for Patients:  BloggerCourse.com  Fact Sheet for Healthcare Providers:  SeriousBroker.it  This test is no t yet approved or cleared by the [place] FDA and  has been authorized for detection and/or diagnosis of SARS-CoV-2 by FDA under an Emergency Use Authorization (EUA). This EUA will remain  in effect (meaning this test can be used) for the duration of the COVID-19 declaration under Section 564(b)(1) of the Act, 21 U.S.C.section 360bbb-3(b)(1), unless the authorization is terminated  or revoked sooner.       Influenza A by PCR NEGATIVE NEGATIVE Final   Influenza B by PCR NEGATIVE NEGATIVE Final    Comment: (NOTE) The Xpert Xpress  SARS-CoV-2/FLU/RSV plus assay is intended as an aid in the diagnosis of influenza from Nasopharyngeal swab specimens and should not be used as a sole basis for treatment. Nasal washings and aspirates are unacceptable for Xpert Xpress SARS-CoV-2/FLU/RSV testing.  Fact Sheet for Patients: BloggerCourse.com  Fact Sheet for Healthcare Providers: SeriousBroker.it  This test is not yet approved or cleared by the [place] FDA and has been authorized for detection and/or diagnosis of SARS-CoV-2 by FDA under an Emergency Use Authorization (EUA). This EUA will remain in effect (meaning this test can be used) for the duration of the COVID-19 declaration under Section 564(b)(1) of the Act, 21 U.S.C. section 360bbb-3(b)(1), unless the authorization is terminated or revoked.  Performed at [facility], [address] [place], [place] [address]   Blood Culture (routine x 2)     Status: None (Preliminary result)   Collection Time: 06/17/21  4:49 PM   Specimen: Right Antecubital; Blood  Result Value Ref Range Status   Specimen Description   Final    RIGHT ANTECUBITAL BOTTLES DRAWN AEROBIC AND ANAEROBIC   Special Requests Blood Culture adequate volume  Final   Culture   Final    NO GROWTH 2 DAYS Performed at [facility], [address], [place], [place] [address]    Report Status PENDING  Incomplete  Urine Culture     Status: None   Collection Time: 06/17/21  5:34 PM   Specimen: In/Out Cath Urine  Result Value Ref Range Status   Specimen Description   Final    IN/OUT CATH URINE Performed at  [facility], [address] [place], [place] [address]    Special Requests   Final    NONE Performed at [facility], [address] [place], [place] [address]    Culture   Final    NO GROWTH Performed at [facility], [address] [place], [place] [address]    Report Status 06/19/2021 FINAL  Final  MRSA Next Gen by PCR, Nasal      Status: None   Collection Time: 06/17/21 10:00 PM   Specimen: Nasal Mucosa; Nasal Swab  Result Value Ref Range Status   MRSA by PCR Next Gen NOT DETECTED NOT DETECTED Final    Comment: (NOTE) The GeneXpert MRSA Assay (FDA approved for NASAL specimens only), is one component of a comprehensive MRSA colonization surveillance program. It is not intended to diagnose MRSA infection nor to guide or monitor treatment for MRSA infections. Test performance is not FDA approved in patients less than 35 years old. Performed at [facility], [address] [place], [place] [address]      Time coordinating discharge: 35mins  SIGNED:   Erick BlinksJehanzeb Juniper Snyders, MD  [facility] 06/19/2021, 8:26 PM   If 7PM-7AM, please contact night-coverage [email or web address]

## 2021-06-19 NOTE — Progress Notes (Signed)
Patient alert and oriented x4. No current complaints of pain, shortness of breath, chest pain, dizziness, nausea or vomiting. Patient up out of bed, ambulatory independently with steady gait. Patient tolerated PO meds and diet well, appetite good. Went over discharge summary/instructions, follow up appointments information and medication education. All questions answered and patient expressed full understanding of instructions/summary, follow up information and education with teach back. IV removed with no complications. Patient stated she spoke with someone at [employer] and nebulizer was being sent to her house. Patient discharged home with all belongings via car.

## 2021-06-19 NOTE — Progress Notes (Addendum)
°  Transition of Care [facility]) Screening Note   Patient Details  Name: Teresa [name] Date of Birth: 09-09-1985   Transition of Care [facility]) CM/SW Contact:    Leitha Bleak, RN Phone Number: 06/19/2021, 2:06 PM  Addendum:  Patient wanting to discharge home now, needing a neb machine.  TOC referred at [facility] with [employer]. They will bring by the room or drop ship if patient has discharged.   Transition of Care Department [facility]) has reviewed patient and no TOC needs have been identified at this time. We will continue to monitor patient advancement through interdisciplinary progression rounds. If new patient transition needs arise, please place a TOC consult.

## 2021-06-22 ENCOUNTER — Other Ambulatory Visit: Payer: Self-pay

## 2021-06-22 ENCOUNTER — Inpatient Hospital Stay (HOSPITAL_COMMUNITY)
Admission: EM | Admit: 2021-06-22 | Discharge: 2021-06-24 | DRG: 202 | Disposition: A | Discharge status: Home / Self Care | Payer: BC Managed Care – PPO | Attending: Internal Medicine | Admitting: Internal Medicine

## 2021-06-22 ENCOUNTER — Encounter (HOSPITAL_COMMUNITY): Payer: Self-pay | Admitting: *Deleted

## 2021-06-22 ENCOUNTER — Emergency Department (HOSPITAL_COMMUNITY): Payer: BC Managed Care – PPO

## 2021-06-22 DIAGNOSIS — J189 Pneumonia, unspecified organism: Secondary | ICD-10-CM | POA: Diagnosis not present

## 2021-06-22 DIAGNOSIS — I10 Essential (primary) hypertension: Secondary | ICD-10-CM | POA: Diagnosis present

## 2021-06-22 DIAGNOSIS — Z885 Allergy status to narcotic agent status: Secondary | ICD-10-CM

## 2021-06-22 DIAGNOSIS — K219 Gastro-esophageal reflux disease without esophagitis: Secondary | ICD-10-CM | POA: Diagnosis present

## 2021-06-22 DIAGNOSIS — J9601 Acute respiratory failure with hypoxia: Secondary | ICD-10-CM | POA: Diagnosis not present

## 2021-06-22 DIAGNOSIS — R0902 Hypoxemia: Secondary | ICD-10-CM

## 2021-06-22 DIAGNOSIS — E876 Hypokalemia: Secondary | ICD-10-CM | POA: Diagnosis present

## 2021-06-22 DIAGNOSIS — Z79890 Hormone replacement therapy (postmenopausal): Secondary | ICD-10-CM

## 2021-06-22 DIAGNOSIS — J45901 Unspecified asthma with (acute) exacerbation: Principal | ICD-10-CM

## 2021-06-22 DIAGNOSIS — E039 Hypothyroidism, unspecified: Secondary | ICD-10-CM | POA: Diagnosis present

## 2021-06-22 DIAGNOSIS — Z881 Allergy status to other antibiotic agents status: Secondary | ICD-10-CM

## 2021-06-22 DIAGNOSIS — Z6841 Body Mass Index (BMI) 40.0 and over, adult: Secondary | ICD-10-CM

## 2021-06-22 DIAGNOSIS — Z8249 Family history of ischemic heart disease and other diseases of the circulatory system: Secondary | ICD-10-CM

## 2021-06-22 DIAGNOSIS — Z79899 Other long term (current) drug therapy: Secondary | ICD-10-CM

## 2021-06-22 DIAGNOSIS — R0789 Other chest pain: Secondary | ICD-10-CM | POA: Diagnosis present

## 2021-06-22 DIAGNOSIS — Z888 Allergy status to other drugs, medicaments and biological substances status: Secondary | ICD-10-CM

## 2021-06-22 DIAGNOSIS — Z91040 Latex allergy status: Secondary | ICD-10-CM

## 2021-06-22 DIAGNOSIS — Z720 Tobacco use: Secondary | ICD-10-CM | POA: Diagnosis present

## 2021-06-22 DIAGNOSIS — E66813 Obesity, class 3: Secondary | ICD-10-CM | POA: Diagnosis present

## 2021-06-22 DIAGNOSIS — Z20822 Contact with and (suspected) exposure to covid-19: Secondary | ICD-10-CM | POA: Diagnosis present

## 2021-06-22 DIAGNOSIS — Z87891 Personal history of nicotine dependence: Secondary | ICD-10-CM

## 2021-06-22 DIAGNOSIS — J181 Lobar pneumonia, unspecified organism: Secondary | ICD-10-CM

## 2021-06-22 LAB — CBC WITH DIFFERENTIAL/PLATELET
Abs Immature Granulocytes: 0.17 10*3/uL — ABNORMAL HIGH (ref 0.00–0.07)
Basophils Absolute: 0.1 10*3/uL (ref 0.0–0.1)
Basophils Relative: 1 %
Eosinophils Absolute: 0.1 10*3/uL (ref 0.0–0.5)
Eosinophils Relative: 1 %
HCT: 37.1 % (ref 36.0–46.0)
Hemoglobin: 11.6 g/dL — ABNORMAL LOW (ref 12.0–15.0)
Immature Granulocytes: 2 %
Lymphocytes Relative: 24 %
Lymphs Abs: 2.6 10*3/uL (ref 0.7–4.0)
MCH: 28.7 pg (ref 26.0–34.0)
MCHC: 31.3 g/dL (ref 30.0–36.0)
MCV: 91.8 fL (ref 80.0–100.0)
Monocytes Absolute: 0.7 10*3/uL (ref 0.1–1.0)
Monocytes Relative: 7 %
Neutro Abs: 6.9 10*3/uL (ref 1.7–7.7)
Neutrophils Relative %: 65 %
Platelets: 272 10*3/uL (ref 150–400)
RBC: 4.04 MIL/uL (ref 3.87–5.11)
RDW: 13.2 % (ref 11.5–15.5)
WBC: 10.5 10*3/uL (ref 4.0–10.5)
nRBC: 0 % (ref 0.0–0.2)

## 2021-06-22 LAB — BASIC METABOLIC PANEL
Anion gap: 11 (ref 5–15)
BUN: 9 mg/dL (ref 6–20)
CO2: 28 mmol/L (ref 22–32)
Calcium: 8.9 mg/dL (ref 8.9–10.3)
Chloride: 101 mmol/L (ref 98–111)
Creatinine, Ser: 0.71 mg/dL (ref 0.44–1.00)
GFR, Estimated: >60 mL/min (ref >60)
Glucose, Bld: 100 mg/dL — ABNORMAL HIGH (ref 70–99)
Potassium: 3.4 mmol/L — ABNORMAL LOW (ref 3.5–5.1)
Sodium: 140 mmol/L (ref 135–145)

## 2021-06-22 LAB — CULTURE, BLOOD (ROUTINE X 2)
Culture: NO GROWTH
Culture: NO GROWTH
Special Requests: ADEQUATE
Special Requests: ADEQUATE

## 2021-06-22 LAB — RESP PANEL BY RT-PCR (FLU A&B, COVID) ARPGX2
Influenza A by PCR: NEGATIVE
Influenza B by PCR: NEGATIVE
SARS Coronavirus 2 by RT PCR: NEGATIVE

## 2021-06-22 LAB — TROPONIN I (HIGH SENSITIVITY): Troponin I (High Sensitivity): 6 ng/L (ref <18)

## 2021-06-22 LAB — MAGNESIUM: Magnesium: 1.8 mg/dL (ref 1.7–2.4)

## 2021-06-22 MED ORDER — ENOXAPARIN SODIUM 40 MG/0.4ML IJ SOSY
40.0000 mg | PREFILLED_SYRINGE | INTRAMUSCULAR | Status: DC
  Administered 2021-06-22 – 2021-06-23 (×2): 40 mg via SUBCUTANEOUS
  Filled 2021-06-22 (×2): qty 0.4

## 2021-06-22 MED ORDER — POLYETHYLENE GLYCOL 3350 17 G PO PACK: 17.0000 g | PACK | Freq: Every day | ORAL | Status: DC | PRN

## 2021-06-22 MED ORDER — TIZANIDINE HCL 4 MG PO TABS
4.0000 mg | ORAL_TABLET | Freq: Three times a day (TID) | ORAL | Status: DC | PRN
  Administered 2021-06-23: 4 mg via ORAL
  Filled 2021-06-22: qty 1

## 2021-06-22 MED ORDER — ONDANSETRON HCL 4 MG/2ML IJ SOLN
4.0000 mg | Freq: Four times a day (QID) | INTRAMUSCULAR | Status: DC | PRN
  Administered 2021-06-23 – 2021-06-24 (×3): 4 mg via INTRAVENOUS
  Filled 2021-06-22 (×3): qty 2

## 2021-06-22 MED ORDER — ONDANSETRON HCL 4 MG PO TABS: 4.0000 mg | ORAL_TABLET | Freq: Four times a day (QID) | ORAL | Status: DC | PRN

## 2021-06-22 MED ORDER — ACETAMINOPHEN 650 MG RE SUPP: 650.0000 mg | Freq: Four times a day (QID) | RECTAL | Status: DC | PRN

## 2021-06-22 MED ORDER — MONTELUKAST SODIUM 10 MG PO TABS
10.0000 mg | ORAL_TABLET | Freq: Every day | ORAL | Status: DC
  Administered 2021-06-22 – 2021-06-24 (×3): 10 mg via ORAL
  Filled 2021-06-22 (×3): qty 1

## 2021-06-22 MED ORDER — IPRATROPIUM-ALBUTEROL 0.5-2.5 (3) MG/3ML IN SOLN
3.0000 mL | Freq: Once | RESPIRATORY_TRACT | Status: AC
  Administered 2021-06-22: 3 mL via RESPIRATORY_TRACT
  Filled 2021-06-22: qty 3

## 2021-06-22 MED ORDER — FUROSEMIDE 10 MG/ML IJ SOLN
20.0000 mg | Freq: Once | INTRAMUSCULAR | Status: AC
  Administered 2021-06-22: 20 mg via INTRAVENOUS
  Filled 2021-06-22: qty 2

## 2021-06-22 MED ORDER — ALPRAZOLAM 1 MG PO TABS
1.0000 mg | ORAL_TABLET | Freq: Every day | ORAL | Status: DC | PRN
  Administered 2021-06-22 – 2021-06-23 (×2): 1 mg via ORAL
  Filled 2021-06-22 (×2): qty 1

## 2021-06-22 MED ORDER — PANTOPRAZOLE SODIUM 40 MG PO TBEC
40.0000 mg | DELAYED_RELEASE_TABLET | Freq: Every day | ORAL | Status: DC
  Administered 2021-06-22 – 2021-06-24 (×3): 40 mg via ORAL
  Filled 2021-06-22 (×3): qty 1

## 2021-06-22 MED ORDER — POTASSIUM CHLORIDE CRYS ER 20 MEQ PO TBCR
40.0000 meq | EXTENDED_RELEASE_TABLET | Freq: Once | ORAL | Status: AC
  Administered 2021-06-22: 40 meq via ORAL
  Filled 2021-06-22: qty 2

## 2021-06-22 MED ORDER — GUAIFENESIN-DM 100-10 MG/5ML PO SYRP
5.0000 mL | ORAL_SOLUTION | ORAL | Status: DC | PRN
  Administered 2021-06-23 (×4): 5 mL via ORAL
  Filled 2021-06-22 (×4): qty 5

## 2021-06-22 MED ORDER — ACETAMINOPHEN 325 MG PO TABS
650.0000 mg | ORAL_TABLET | Freq: Four times a day (QID) | ORAL | Status: DC | PRN
  Administered 2021-06-22 – 2021-06-23 (×2): 650 mg via ORAL
  Filled 2021-06-22 (×2): qty 2

## 2021-06-22 MED ORDER — PREGABALIN 50 MG PO CAPS
50.0000 mg | ORAL_CAPSULE | Freq: Three times a day (TID) | ORAL | Status: DC
  Administered 2021-06-22 – 2021-06-24 (×5): 50 mg via ORAL
  Filled 2021-06-22 (×5): qty 1

## 2021-06-22 MED ORDER — LEVOTHYROXINE SODIUM 75 MCG PO TABS
175.0000 ug | ORAL_TABLET | Freq: Every day | ORAL | Status: DC
  Administered 2021-06-23 – 2021-06-24 (×2): 175 ug via ORAL
  Filled 2021-06-22 (×2): qty 1

## 2021-06-22 MED ORDER — LEVOFLOXACIN 750 MG PO TABS
750.0000 mg | ORAL_TABLET | Freq: Every day | ORAL | Status: DC
  Administered 2021-06-23 – 2021-06-24 (×2): 750 mg via ORAL
  Filled 2021-06-22 (×2): qty 1

## 2021-06-22 NOTE — ED Notes (Signed)
Prior to walking 92% hr 98, walking 88% hr 115, one minute after resting 90% hr 110. Pt had to rest due to sob while walking strong dry cough noted. Mild labored breathing noted during and after walk. NAD.

## 2021-06-22 NOTE — ED Provider Notes (Signed)
[facility] EMERGENCY DEPARTMENT Provider Note   CSN: [id number] Arrival date & time: 06/22/21  1408     History  Chief Complaint  Patient presents with   Fatigue    Teresa [name] is a 36 y.o. female.  HPI Patient presents with shortness of breath.  Discharged 3 days ago after admission for pneumonia.  States she is feeling good on Monday when she was discharged the next day feeling bad again.  States more short of breath.  No appetite.  States she cannot get up and walk around.  Difficulty sleeping.  States she has a pulse ox at home and they are checking her pulse ox at night and will go down to the 60s.  There was supposed to be follow-up for sleep studies but has not been done yet.  No dysuria.  No abdominal pain.  States she is more fatigued and feeling worse.  States she thinks she was discharged too early.   Past Medical History:  Diagnosis Date   Asthma    GERD (gastroesophageal reflux disease)    Hypertension    Hypothyroidism    PVC (premature ventricular contraction)    Tobacco abuse     Home Medications Prior to Admission medications   Medication Sig Start Date End Date Taking? Authorizing Provider  albuterol (PROVENTIL) (2.5 MG/3ML) 0.083% nebulizer solution Take 3 mLs (2.5 mg total) by nebulization every 4 (four) hours as needed for wheezing or shortness of breath. 06/19/21 06/19/22  Erick Blinks, MD  ALPRAZolam Prudy Feeler) 1 MG tablet Take 1 mg by mouth daily as needed for anxiety. 08/25/19   [provider]  cetirizine (ZYRTEC ALLERGY) 10 MG tablet Take 1 tablet (10 mg total) by mouth daily. 04/07/20   Avegno, Zachery Dakins, FNP  guaiFENesin (MUCINEX) 600 MG 12 hr tablet Take 1 tablet (600 mg total) by mouth 2 (two) times daily. 06/19/21   Erick Blinks, MD  levofloxacin (LEVAQUIN) 750 MG tablet Take 1 tablet (750 mg total) by mouth daily for 7 days. 06/19/21 06/26/21  Erick Blinks, MD  levothyroxine (SYNTHROID) 175 MCG tablet Take 175 mcg by mouth daily.  06/01/21   [provider]  montelukast (SINGULAIR) 10 MG tablet Take 10 mg by mouth daily. 09/11/19   [provider]  omeprazole (PRILOSEC) 40 MG capsule TAKE 1 CAPSULE(40 MG) BY MOUTH DAILY 03/28/20   O'Neal, Ronnald Ramp, MD  ondansetron (ZOFRAN-ODT) 8 MG disintegrating tablet ondansetron 8 mg disintegrating tablet  Place 1 tablet twice a day by translingual route as needed.    [provider]  pregabalin (LYRICA) 50 MG capsule Take 50 mg by mouth 3 (three) times daily. 06/01/21   [provider]  tiZANidine (ZANAFLEX) 4 MG tablet Take 1 tablet (4 mg total) by mouth every 8 (eight) hours as needed for muscle spasms. 06/19/21   Erick Blinks, MD  ranitidine (ZANTAC) 150 MG tablet Take 150 mg by mouth 2 (two) times daily.  08/08/11  [provider]      Allergies    Betadine [povidone iodine], Ceclor [cefaclor], Hydrocodone, Morphine and related, Adhesive [tape], and Latex    Review of Systems   Review of Systems  Constitutional:  Positive for appetite change.  Respiratory:  Positive for shortness of breath.   Cardiovascular:  Positive for chest pain.  Gastrointestinal:  Negative for abdominal pain.  Musculoskeletal:  Negative for back pain.  Neurological:  Positive for weakness.   Physical Exam Updated Vital Signs BP (!) 133/100 (BP Location: Left  Arm)    Pulse 98    Temp 98 F (36.7 C) (Oral)    Resp 18    LMP 06/29/2011    SpO2 92%  Physical Exam Vitals and nursing note reviewed.  Eyes:     Pupils: Pupils are equal, round, and reactive to light.  Cardiovascular:     Rate and Rhythm: Regular rhythm.  Pulmonary:     Breath sounds: Wheezing present.     Comments: Wheezes bilateral bases with prolonged expirations with Abdominal:     Tenderness: There is no abdominal tenderness.  Musculoskeletal:        General: No tenderness.     Right lower leg: No edema.     Left lower leg: No edema.  Skin:    General: Skin is warm.     Capillary  Refill: Capillary refill takes less than 2 seconds.  Neurological:     Mental Status: She is alert and oriented to person, place, and time.    ED Results / Procedures / Treatments   Labs (all labs ordered are listed, but only abnormal results are displayed) Labs Reviewed  CBC WITH DIFFERENTIAL/PLATELET - Abnormal; Notable for the following components:      Result Value   Hemoglobin 11.6 (*)    Abs Immature Granulocytes 0.17 (*)    All other components within normal limits  BASIC METABOLIC PANEL - Abnormal; Notable for the following components:   Potassium 3.4 (*)    Glucose, Bld 100 (*)    All other components within normal limits    EKG None  Radiology DG Chest Portable 1 View  Result Date: 06/22/2021 CLINICAL DATA:  Chest pain and shortness of breath. Recent pneumonia diagnosis. EXAM: PORTABLE CHEST 1 VIEW COMPARISON:  Radiograph 3 days ago, on chest CT 5 days ago FINDINGS: Persistent confluent right lung base opacity, unchanged or possibly worsened in the interim. Decreased linear opacity in the right mid lung. No new airspace disease. No pneumothorax. Stable osseous structures. IMPRESSION: Persistent confluent right lung base opacity, unchanged or possibly worsened in the interim. Decreased linear opacity in the right mid lung. Electronically Signed   By: Narda RutherfordMelanie  Sanford M.D.   On: 06/22/2021 15:51    Procedures Procedures    Medications Ordered in ED Medications  ipratropium-albuterol (DUONEB) 0.5-2.5 (3) MG/3ML nebulizer solution 3 mL (3 mLs Nebulization Given 06/22/21 1613)    ED Course/ Medical Decision Making/ A&P                           Medical Decision Making Amount and/or Complexity of Data Reviewed Labs: ordered. Radiology: ordered.  Risk Prescription drug management.  Patient presents with shortness of breath and fatigue.  Discharged 2 days ago for pneumonia admission.  States had been doing well for day but then worsened.  Does have some wheezing on  exam.  Does have ambulatory hypoxia which had resolved prior to discharge.  Chest x-ray done and independently interpreted by me.  Shows stable pneumonia.  Lab work relatively stable. Patient is hypoxic with ambulation.  Will get tachy and hypoxia down into the upper 80s.  With the hypoxia I feel she would benefit from mission to the hospital.  Also reportedly has had sats going down to the 60s at home at night.  May need more urgent work-up for that.  Will discuss with hospitalist for admission.  Doubt pulmonary embolism.  Does have some wheezing.  No unilateral swelling on her legs.  Final Clinical Impression(s) / ED Diagnoses Final diagnoses:  Hypoxia    Rx / DC Orders ED Discharge Orders     None         Benjiman Core, MD 06/22/21 1709

## 2021-06-22 NOTE — ED Triage Notes (Signed)
States she was diagnosed with pneumonia 5 days ago and not is feeling much worse

## 2021-06-22 NOTE — ED Notes (Signed)
RT called for tx 

## 2021-06-22 NOTE — H&P (Signed)
History and Physical    [name] [id number] DOB: 1986/01/18 DOA: 06/22/2021  PCP: Coletta Memos, PA-C   Patient coming from: Home  I have personally briefly reviewed patient's old medical records in [place]  Chief Complaint: Difficulty breathing, cough  HPI: Teresa [name] is a 36 y.o. female with medical history significant for  Tobacco abuse, HTN, GERD, who presented to the ED with complaints of difficulty breathing, cough and decreased appetite.  Patient was recently admitted to the hospital/21-1/23 for pneumonia she was treated with Levaquin and sent home with same.  Strep and Legionella antigen negative.  She transiently required O2 in the hospital but was weaned off prior to discharge.  She reports on getting home the next day she felt well, but subsequently she started feeling worse, with decreased appetite.  She reports her breathing is the same since she was diagnosed with pneumonia, she is still coughing, its not worse, still productive of greenish sputum.  She also reports wheezing. She reports some pain in her anterior , mid chest, likely from coughing.  She reports lower extremity swelling that she noticed today.  No fevers no chills. Reports at home when she sleeps, her O2 sats dropped to 60%.  On discharge, outpatient sleep study was arranged for her to be done when she recovered from her pneumonia.  ED Course: Afebrile temperature 98.  Stable vitals.  O2 sats > 91% on room air, but with ambulation, on room air, O2 sats dropped to 88%, with increase in heart rate to 115, and mild increased work of breathing WBC 10.5.  Chest x-ray shows persistent confluent right lung base opacity, unchanged or possibly worsened in the interim.  Decrease linear opacity in the right midlung. Duo nebs given.  Hospitalist to admit.  Review of Systems: As per HPI all other systems reviewed and negative.  Past Medical History:  Diagnosis Date   Asthma    GERD  (gastroesophageal reflux disease)    Hypertension    Hypothyroidism    PVC (premature ventricular contraction)    Tobacco abuse     Past Surgical History:  Procedure Laterality Date   ABDOMINAL HYSTERECTOMY  08/15/2011   Procedure: HYSTERECTOMY ABDOMINAL;  Surgeon: Florian Buff, MD;  Location: [facility];  Service: Gynecology;  Laterality: N/A;   BIOPSY  07/26/2011   [id number] esophageal erosions consistent with erosive reflux esophagitis.  Patulous EG junction, status post passage of a Maloney dilator (empirically), status post esophageal biopsy  followed dilation/ A 3-4 cm hiatal hernia, otherwise normal stomach, D1, D2.   CESAREAN SECTION  2007 and 2011   CHOLECYSTECTOMY  2007   ESOPHAGOGASTRODUODENOSCOPY  2008   incomplete due to inadequate conscious sedation, circumferential distal esophageal erosions, hiatal hernia   MALONEY DILATION  07/26/2011   Procedure: MALONEY DILATION;  Surgeon: Daneil Dolin, MD;  Location: [facility];  Service: Endoscopy;  Laterality: N/A;  28 French Dilator   SALPINGOOPHORECTOMY  08/15/2011   Procedure: SALPINGO OOPHERECTOMY;  Surgeon: Florian Buff, MD;  Location: [facility];  Service: Gynecology;  Laterality: Bilateral;   TUBAL LIGATION  2011     reports that she has quit smoking. Her smoking use included cigarettes. She started smoking about 15 years ago. She has a 5.00 pack-year smoking history. She uses smokeless tobacco. She reports that she does not drink alcohol and does not use drugs.  Allergies  Allergen Reactions   Betadine [Povidone Iodine] Hives   Ceclor [Cefaclor] Hives   Hydrocodone  Itching   Morphine And Related     PVCs and "it stops my heart" per patient.   Adhesive [Tape] Rash   Latex Rash    Family History  Problem Relation Age of Onset   Ulcers Mother        Questionable history   Hypertension Mother    Heart failure Paternal Grandmother    Liver disease Neg Hx    Cancer Neg Hx        Colorectal cancer   Anesthesia problems Neg Hx     Hypotension Neg Hx    Malignant hyperthermia Neg Hx    Pseudochol deficiency Neg Hx     Prior to Admission medications   Medication Sig Start Date End Date Taking? Authorizing Provider  albuterol (PROVENTIL) (2.5 MG/3ML) 0.083% nebulizer solution Take 3 mLs (2.5 mg total) by nebulization every 4 (four) hours as needed for wheezing or shortness of breath. 06/19/21 06/19/22 Yes Memon, Jolaine Artist, MD  ALPRAZolam Duanne Moron) 1 MG tablet Take 1 mg by mouth daily as needed for anxiety. 08/25/19  Yes [provider]  guaiFENesin (MUCINEX) 600 MG 12 hr tablet Take 1 tablet (600 mg total) by mouth 2 (two) times daily. 06/19/21  Yes Kathie Dike, MD  ibuprofen (ADVIL) 200 MG tablet Take 800 mg by mouth every 6 (six) hours as needed.   Yes [provider]  levofloxacin (LEVAQUIN) 750 MG tablet Take 1 tablet (750 mg total) by mouth daily for 7 days. 06/19/21 06/26/21 Yes Kathie Dike, MD  levothyroxine (SYNTHROID) 175 MCG tablet Take 175 mcg by mouth daily. 06/01/21  Yes [provider]  montelukast (SINGULAIR) 10 MG tablet Take 10 mg by mouth daily. 09/11/19  Yes [provider]  omeprazole (PRILOSEC) 40 MG capsule TAKE 1 CAPSULE(40 MG) BY MOUTH DAILY Patient taking differently: Take 40 mg by mouth daily. 03/28/20  Yes O'Neal, Cassie Freer, MD  ondansetron (ZOFRAN-ODT) 8 MG disintegrating tablet Take 8 mg by mouth 2 (two) times daily as needed for nausea or vomiting.   Yes [provider]  pregabalin (LYRICA) 50 MG capsule Take 50 mg by mouth 3 (three) times daily. 06/01/21  Yes [provider]  tiZANidine (ZANAFLEX) 4 MG tablet Take 1 tablet (4 mg total) by mouth every 8 (eight) hours as needed for muscle spasms. 06/19/21  Yes Kathie Dike, MD  cetirizine (ZYRTEC ALLERGY) 10 MG tablet Take 1 tablet (10 mg total) by mouth daily. Patient not taking: Reported on 06/22/2021 04/07/20   Emerson Monte, FNP  ranitidine (ZANTAC) 150 MG tablet Take 150 mg by  mouth 2 (two) times daily.  08/08/11  [provider]    Physical Exam: Vitals:   06/22/21 1433 06/22/21 1615 06/22/21 1716 06/22/21 1825  BP: (!) 133/100  135/83 (!) 141/93  Pulse: 98  96 95  Resp: 18  19 18   Temp: 98 F (36.7 C)  98.4 F (36.9 C) 98.2 F (36.8 C)  TempSrc: Oral  Oral Oral  SpO2: 91% 92%  92%    Constitutional: NAD, calm, comfortable Vitals:   06/22/21 1433 06/22/21 1615 06/22/21 1716 06/22/21 1825  BP: (!) 133/100  135/83 (!) 141/93  Pulse: 98  96 95  Resp: 18  19 18   Temp: 98 F (36.7 C)  98.4 F (36.9 C) 98.2 F (36.8 C)  TempSrc: Oral  Oral Oral  SpO2: 91% 92%  92%   Eyes: PERRL, lids and conjunctivae normal ENMT: Mucous membranes are moist. Posterior pharynx clear of any exudate  or lesions.Normal dentition.  Neck: normal, supple, no masses, no thyromegaly Respiratory: clear to auscultation bilaterally, no wheezing, no crackles. Normal respiratory effort. No accessory muscle use.  Cardiovascular: Regular rate and rhythm, no murmurs / rubs / gallops.  1+ pitting extremity edema to mid leg. 2+ pedal pulses.  Abdomen: no tenderness, no masses palpated. No hepatosplenomegaly. Bowel sounds positive.  Musculoskeletal: no clubbing / cyanosis. No joint deformity upper and lower extremities.  Skin: no rashes, lesions, ulcers. No induration Neurologic: No apparent cranial nerve abnormality, moving extremity spontaneously.  Psychiatric: Normal judgment and insight. Alert and oriented x 3. Normal mood.   Labs on Admission: I have personally reviewed following labs and imaging studies  CBC: Recent Labs  Lab 06/17/21 1640 06/18/21 0429 06/19/21 0554 06/22/21 1539  WBC 22.7* 21.8* 11.6* 10.5  NEUTROABS 18.6*  --   --  6.9  HGB 13.2 10.0* 9.7* 11.6*  HCT 43.0 33.8* 31.5* 37.1  MCV 94.5 94.4 94.6 91.8  PLT 236 205 180 [id number]   Basic Metabolic Panel: Recent Labs  Lab 06/17/21 1640 06/18/21 0429 06/19/21 0554 06/22/21 1539  NA 136 137 135 140   K 3.9 4.0 4.0 3.4*  CL 101 106 103 101  CO2 27 27 26 28   GLUCOSE 113* 127* 140* 100*  BUN 13 8 7 9   CREATININE 1.18* 0.74 0.54 0.71  CALCIUM 8.7* 7.7* 8.5* 8.9   GFR: Estimated Creatinine Clearance: 133 mL/min (by C-G formula based on SCr of 0.71 mg/dL). Liver Function Tests: Recent Labs  Lab 06/17/21 1640 06/19/21 0554  AST 12* 9*  ALT 14 13  ALKPHOS 81 79  BILITOT 0.6 0.2*  PROT 7.4 6.3*  ALBUMIN 3.5 2.7*   Coagulation Profile: Recent Labs  Lab 06/17/21 1640  INR 1.1   Urine analysis:    Component Value Date/Time   COLORURINE AMBER (A) 06/17/2021 1734   APPEARANCEUR CLOUDY (A) 06/17/2021 1734   LABSPEC 1.028 06/17/2021 1734   PHURINE 5.0 06/17/2021 1734   GLUCOSEU 50 (A) 06/17/2021 1734   HGBUR SMALL (A) 06/17/2021 1734   BILIRUBINUR SMALL (A) 06/17/2021 1734   KETONESUR 5 (A) 06/17/2021 1734   PROTEINUR 100 (A) 06/17/2021 1734   UROBILINOGEN 0.2 09/13/2013 0315   NITRITE NEGATIVE 06/17/2021 1734   LEUKOCYTESUR NEGATIVE 06/17/2021 1734    Radiological Exams on Admission: DG Chest Portable 1 View  Result Date: 06/22/2021 CLINICAL DATA:  Chest pain and shortness of breath. Recent pneumonia diagnosis. EXAM: PORTABLE CHEST 1 VIEW COMPARISON:  Radiograph 3 days ago, on chest CT 5 days ago FINDINGS: Persistent confluent right lung base opacity, unchanged or possibly worsened in the interim. Decreased linear opacity in the right mid lung. No new airspace disease. No pneumothorax. Stable osseous structures. IMPRESSION: Persistent confluent right lung base opacity, unchanged or possibly worsened in the interim. Decreased linear opacity in the right mid lung. Electronically Signed   By: Keith Rake M.D.   On: 06/22/2021 15:51    EKG: Independently reviewed.   Assessment/Plan Principal Problem:   Acute respiratory failure with hypoxia (HCC) Active Problems:   Pneumonia   Obesity, Class III, BMI 40-49.9 (morbid obesity) ([place])    Pneumonia with acute hypoxic  respiratory failure-O2 sats with ambulation down to 88%.  Recent hospitalization for pneumonia, treated and discharged on Levaquin, to complete 7 days, last dose, 1/30.  Afebrile, improved leukocytosis.  Persistent but stable dyspnea and cough.  Wheezing has resolved since DuoNeb given in ED.  Chest X-ray showing Persistent confluent right base  opacity unchanged or possibly worsened in the interim. - Albuterol nebs X2, as needed -Mucolytic's -Will not escalate antibiotics at this time, continue oral Levaquin to complete course -IV Lasix 20 mg x 1  -Needs sleep study as outpatient, when recovered from PNA  Chest pain likely musculoskeletal from coughing.  Not tachycardic.  She has bilateral lower extremity swelling which I think is from fluids from recent hospitalization. - EKG - trops x 2 - Lasix 20mg  x 1  Hypothyroidism -Continue with home dose Synthroid   Hypertension - not on medications   GERD -Continue with PPI   Tobacco abuse   DVT prophylaxis: Lovenox Code Status: Full code Family Communication: None at bedside Disposition Plan:  ~ 1 -2 days Consults called: None Admission status: Obs tele   Bethena Roys MD [facility]  06/22/2021, 7:53 PM

## 2021-06-23 ENCOUNTER — Observation Stay (HOSPITAL_COMMUNITY): Payer: BC Managed Care – PPO

## 2021-06-23 DIAGNOSIS — E039 Hypothyroidism, unspecified: Secondary | ICD-10-CM | POA: Diagnosis present

## 2021-06-23 DIAGNOSIS — Z720 Tobacco use: Secondary | ICD-10-CM | POA: Diagnosis not present

## 2021-06-23 DIAGNOSIS — Z881 Allergy status to other antibiotic agents status: Secondary | ICD-10-CM | POA: Diagnosis not present

## 2021-06-23 DIAGNOSIS — J45901 Unspecified asthma with (acute) exacerbation: Secondary | ICD-10-CM | POA: Diagnosis present

## 2021-06-23 DIAGNOSIS — R0902 Hypoxemia: Secondary | ICD-10-CM | POA: Diagnosis present

## 2021-06-23 DIAGNOSIS — Z8249 Family history of ischemic heart disease and other diseases of the circulatory system: Secondary | ICD-10-CM | POA: Diagnosis not present

## 2021-06-23 DIAGNOSIS — Z20822 Contact with and (suspected) exposure to covid-19: Secondary | ICD-10-CM | POA: Diagnosis present

## 2021-06-23 DIAGNOSIS — Z87891 Personal history of nicotine dependence: Secondary | ICD-10-CM | POA: Diagnosis not present

## 2021-06-23 DIAGNOSIS — R0602 Shortness of breath: Secondary | ICD-10-CM

## 2021-06-23 DIAGNOSIS — J181 Lobar pneumonia, unspecified organism: Secondary | ICD-10-CM | POA: Diagnosis present

## 2021-06-23 DIAGNOSIS — Z885 Allergy status to narcotic agent status: Secondary | ICD-10-CM | POA: Diagnosis not present

## 2021-06-23 DIAGNOSIS — R0789 Other chest pain: Secondary | ICD-10-CM | POA: Diagnosis present

## 2021-06-23 DIAGNOSIS — E876 Hypokalemia: Secondary | ICD-10-CM | POA: Diagnosis present

## 2021-06-23 DIAGNOSIS — Z91040 Latex allergy status: Secondary | ICD-10-CM | POA: Diagnosis not present

## 2021-06-23 DIAGNOSIS — K219 Gastro-esophageal reflux disease without esophagitis: Secondary | ICD-10-CM | POA: Diagnosis present

## 2021-06-23 DIAGNOSIS — J9601 Acute respiratory failure with hypoxia: Secondary | ICD-10-CM | POA: Diagnosis present

## 2021-06-23 DIAGNOSIS — Z6841 Body Mass Index (BMI) 40.0 and over, adult: Secondary | ICD-10-CM | POA: Diagnosis not present

## 2021-06-23 DIAGNOSIS — Z79899 Other long term (current) drug therapy: Secondary | ICD-10-CM | POA: Diagnosis not present

## 2021-06-23 DIAGNOSIS — I10 Essential (primary) hypertension: Secondary | ICD-10-CM | POA: Diagnosis present

## 2021-06-23 DIAGNOSIS — Z888 Allergy status to other drugs, medicaments and biological substances status: Secondary | ICD-10-CM | POA: Diagnosis not present

## 2021-06-23 DIAGNOSIS — Z79890 Hormone replacement therapy (postmenopausal): Secondary | ICD-10-CM | POA: Diagnosis not present

## 2021-06-23 LAB — CBC
HCT: 38.2 % (ref 36.0–46.0)
Hemoglobin: 11.5 g/dL — ABNORMAL LOW (ref 12.0–15.0)
MCH: 27.3 pg (ref 26.0–34.0)
MCHC: 30.1 g/dL (ref 30.0–36.0)
MCV: 90.7 fL (ref 80.0–100.0)
Platelets: 289 10*3/uL (ref 150–400)
RBC: 4.21 MIL/uL (ref 3.87–5.11)
RDW: 13.2 % (ref 11.5–15.5)
WBC: 11 10*3/uL — ABNORMAL HIGH (ref 4.0–10.5)
nRBC: 0 % (ref 0.0–0.2)

## 2021-06-23 LAB — BASIC METABOLIC PANEL
Anion gap: 11 (ref 5–15)
BUN: 12 mg/dL (ref 6–20)
CO2: 32 mmol/L (ref 22–32)
Calcium: 8.7 mg/dL — ABNORMAL LOW (ref 8.9–10.3)
Chloride: 101 mmol/L (ref 98–111)
Creatinine, Ser: 0.68 mg/dL (ref 0.44–1.00)
GFR, Estimated: >60 mL/min (ref >60)
Glucose, Bld: 108 mg/dL — ABNORMAL HIGH (ref 70–99)
Potassium: 3.8 mmol/L (ref 3.5–5.1)
Sodium: 144 mmol/L (ref 135–145)

## 2021-06-23 LAB — ECHOCARDIOGRAM COMPLETE
Height: 65 in
S' Lateral: 3 cm
Weight: 4296 oz

## 2021-06-23 LAB — PROCALCITONIN: Procalcitonin: <0.1 ng/mL

## 2021-06-23 LAB — BRAIN NATRIURETIC PEPTIDE: B Natriuretic Peptide: 91 pg/mL (ref 0.0–100.0)

## 2021-06-23 LAB — LACTIC ACID, PLASMA: Lactic Acid, Venous: 0.7 mmol/L (ref 0.5–1.9)

## 2021-06-23 MED ORDER — IPRATROPIUM-ALBUTEROL 0.5-2.5 (3) MG/3ML IN SOLN
3.0000 mL | Freq: Four times a day (QID) | RESPIRATORY_TRACT | Status: DC
  Administered 2021-06-23 – 2021-06-24 (×3): 3 mL via RESPIRATORY_TRACT
  Filled 2021-06-23 (×3): qty 3

## 2021-06-23 MED ORDER — BUDESONIDE 0.5 MG/2ML IN SUSP
0.5000 mg | Freq: Two times a day (BID) | RESPIRATORY_TRACT | Status: DC
  Administered 2021-06-23 – 2021-06-24 (×2): 0.5 mg via RESPIRATORY_TRACT
  Filled 2021-06-23 (×2): qty 2

## 2021-06-23 MED ORDER — METHYLPREDNISOLONE SODIUM SUCC 125 MG IJ SOLR
60.0000 mg | Freq: Two times a day (BID) | INTRAMUSCULAR | Status: DC
  Administered 2021-06-23 – 2021-06-24 (×3): 60 mg via INTRAVENOUS
  Filled 2021-06-23 (×3): qty 2

## 2021-06-23 NOTE — TOC Initial Note (Signed)
Transition of Care [facility]) - Initial/Assessment Note    Patient Details  Name: Teresa [name] MRN: [id number] Date of Birth: Oct 01, 1985  Transition of Care (TOC) CM/SW Contact:    Armanda Heritage, RN Phone Number: 06/23/2021, 4:05 PM  Clinical Narrative:     TOC following patient for possible home oxygen needs.  Referral made to [employer] rep Morrie Sheldon for night time home oxygen.  Plan for an overnight oximetry study tonight.                 Expected Discharge Plan: Home/Self Care Barriers to Discharge: Continued Medical Work up   Patient Goals and CMS Choice Patient states their goals for this hospitalization and ongoing recovery are:: to get better CMS Medicare.gov Compare Post Acute Care list provided to:: Patient Choice offered to / list presented to : Patient  Expected Discharge Plan and Services Expected Discharge Plan: Home/Self Care     Post Acute Care Choice: Durable Medical Equipment                   DME Arranged: Oxygen DME Agency: [employer] Date DME Agency Contacted: 06/23/21 Time DME Agency Contacted: [phone] Representative spoke with at DME Agency: Morrie Sheldon            Prior Living Arrangements/Services     Patient language and need for interpreter reviewed:: Yes Do you feel safe going back to the place where you live?: Yes      Need for Family Participation in Patient Care: No (Comment) Care giver support system in place?: Yes (comment)   Criminal Activity/Legal Involvement Pertinent to Current Situation/Hospitalization: No - Comment as needed  Activities of Daily Living Home Assistive Devices/Equipment: None ADL Screening (condition at time of admission) Patient's cognitive ability adequate to safely complete daily activities?: Yes Is the patient deaf or have difficulty hearing?: No Does the patient have difficulty seeing, even when wearing glasses/contacts?: No Does the patient have difficulty concentrating, remembering, or making decisions?:  No Patient able to express need for assistance with ADLs?: Yes Does the patient have difficulty dressing or bathing?: No Independently performs ADLs?: Yes (appropriate for developmental age) Does the patient have difficulty walking or climbing stairs?: No Weakness of Legs: None Weakness of Arms/Hands: None  Permission Sought/Granted                  Emotional Assessment Appearance:: Appears stated age     Orientation: : Oriented to Self, Oriented to Place, Oriented to  Time, Oriented to Situation   Psych Involvement: No (comment)  Admission diagnosis:  Hypoxia [R09.02] Acute respiratory failure with hypoxia (HCC) [J96.01] Patient Active Problem List   Diagnosis Date Noted   Asthma, chronic, unspecified asthma severity, with acute exacerbation 06/23/2021   Lobar pneumonia (HCC) 06/23/2021   Acute respiratory failure with hypoxia (HCC) 06/22/2021   Obesity, Class III, BMI 40-49.9 (morbid obesity) (HCC) 06/18/2021   Pneumonia 06/17/2021   Esophageal dysphagia 07/04/2011   Rectal bleed 07/04/2011   Syncopal episodes 11/24/2010   Tobacco abuse    PVC (premature ventricular contraction)    GERD, SEVERE 02/08/2009   EPIGASTRIC PAIN 02/08/2009   PCP:  Marshia Ly, PA-C Pharmacy:   [facility] [phone] - [place],  - [address] AT [facility] [address] [place] [address] Phone: [phone] Fax: [phone]     Social Determinants of Health (SDOH) Interventions    Readmission Risk Interventions No flowsheet data found.

## 2021-06-23 NOTE — Progress Notes (Signed)
°  Echocardiogram 2D Echocardiogram has been performed.  Teresa [name] 06/23/2021, 11:27 AM

## 2021-06-23 NOTE — Progress Notes (Signed)
Patient O2 sat dropped to 88 when she was sleeping but came back up to 92% when she wake up. We continue to monitor.

## 2021-06-23 NOTE — Progress Notes (Signed)
PROGRESS NOTE  Teresa [name] ZOX:[id number] DOB: July 04, 1985 DOA: 06/22/2021 PCP: Marshia LyMichaels, Chase A, PA-C  Brief History:  36 year old female with a history of hypertension, hypothyroidism, GERD, asthma presenting with general malaise, generalized weakness, shortness of breath, coughing, and wheezing for 2 days.  The patient was recently discharged from the hospital after a stay from 06/17/2021 to 06/19/2021 when she was treated for pneumonia.  The patient was treated with levofloxacin and discharged home with an additional week of levofloxacin.  She stated that about 2 days after she returned home she began having worsening symptoms as discussed above.  Apparently, she continued to have oxygen desaturation during her sleep.  She is coughing with yellow-green sputum.  She denies any fevers, chills, nausea, vomiting, diarrhea, abdominal pain, dysuria.  Because of her worsening shortness of breath, coughing, and malaise, she presented for further evaluation. In the ED, the patient was afebrile hemodynamically stable with oxygen saturation 92% on room air.  With ambulation, the patient desaturated to 88%.  WBC 10.5, hemoglobin 11.6, platelets 272,000.  Sodium 140, potassium 3.4, serum creatinine 0.71.  The patient was restarted on levofloxacin.  Assessment/Plan: Acute respiratory failure with hypoxia -secondary to asthma exacerbation in the setting on pneumonia -Wean oxygen for saturation greater than 92%.  Acute asthma exacerbation -Start Pulmicort -Start nebulized bronchodilators -Start IV Solu-Medrol  Atypical chest pain -Troponins unremarkable -Personally reviewed EKG--sinus rhythm, T wave inversion V1-V3 -Echocardiogram  Lobar pneumonia -Continue levofloxacin -PCT <0.10  Morbid obesity -BMI 44.68 20 -Lifestyle modification  Tobacco abuse -Tobacco cessation discussed -Last smoked on 06/16/2021  Hypokalemia -Repleted  Hypothyroidism -Continue  levothyroxine  GERD -Continue pantoprazole        Family Communication:   spouse updated at bedside 1/27  Consultants:  none  Code Status:  FULL  DVT Prophylaxis:  Rains Lovenox   Procedures: As Listed in Progress Note Above  Antibiotics: levoflox      Subjective: Patient complains of sob and cough.  Denies n/v/d, abd pain.  Has green sputum without hemoptysis  Objective: Vitals:   06/22/21 2140 06/23/21 0117 06/23/21 0501 06/23/21 0900  BP: (!) 144/81 (!) 147/88 (!) 158/77 (!) 155/80  Pulse: 96 (!) 103 (!) 103 100  Resp: 19 20 18 18   Temp: 98.2 F (36.8 C) 98.1 F (36.7 C) 97.7 F (36.5 C) 98.1 F (36.7 C)  TempSrc: Oral Oral Oral Oral  SpO2: 93% 92% 92% 93%  Weight: 121.8 kg     Height: 5\' 5"  (1.651 m)      No intake or output data in the 24 hours ending 06/23/21 0928 Weight change:  Exam:  General:  Pt is alert, follows commands appropriately, not in acute distress HEENT: No icterus, No thrush, No neck mass, [place]/AT Cardiovascular: RRR, S1/S2, no rubs, no gallops Respiratory: bibasilar rales.  Bilateral exp wheeze.   Abdomen: Soft/+BS, non tender, non distended, no guarding Extremities: No edema, No lymphangitis, No petechiae, No rashes, no synovitis   Data Reviewed: I have personally reviewed following labs and imaging studies Basic Metabolic Panel: Recent Labs  Lab 06/17/21 1640 06/18/21 0429 06/19/21 0554 06/22/21 1539 06/23/21 0523  NA 136 137 135 140 144  K 3.9 4.0 4.0 3.4* 3.8  CL 101 106 103 101 101  CO2 27 27 26 28  32  GLUCOSE 113* 127* 140* 100* 108*  BUN 13 8 7 9 12   CREATININE 1.18* 0.74 0.54 0.71 0.68  CALCIUM 8.7* 7.7* 8.5* 8.9 8.7*  MG  --   --   --  1.8  --    Liver Function Tests: Recent Labs  Lab 06/17/21 1640 06/19/21 0554  AST 12* 9*  ALT 14 13  ALKPHOS 81 79  BILITOT 0.6 0.2*  PROT 7.4 6.3*  ALBUMIN 3.5 2.7*   No results for input(s): LIPASE, AMYLASE in the last 168 hours. No results for input(s): AMMONIA  in the last 168 hours. Coagulation Profile: Recent Labs  Lab 06/17/21 1640  INR 1.1   CBC: Recent Labs  Lab 06/17/21 1640 06/18/21 0429 06/19/21 0554 06/22/21 1539 06/23/21 0523  WBC 22.7* 21.8* 11.6* 10.5 11.0*  NEUTROABS 18.6*  --   --  6.9  --   HGB 13.2 10.0* 9.7* 11.6* 11.5*  HCT 43.0 33.8* 31.5* 37.1 38.2  MCV 94.5 94.4 94.6 91.8 90.7  PLT 236 205 180 272 289   Cardiac Enzymes: No results for input(s): CKTOTAL, CKMB, CKMBINDEX, TROPONINI in the last 168 hours. BNP: Invalid input(s): POCBNP CBG: No results for input(s): GLUCAP in the last 168 hours. HbA1C: No results for input(s): HGBA1C in the last 72 hours. Urine analysis:    Component Value Date/Time   COLORURINE AMBER (A) 06/17/2021 1734   APPEARANCEUR CLOUDY (A) 06/17/2021 1734   LABSPEC 1.028 06/17/2021 1734   PHURINE 5.0 06/17/2021 1734   GLUCOSEU 50 (A) 06/17/2021 1734   HGBUR SMALL (A) 06/17/2021 1734   BILIRUBINUR SMALL (A) 06/17/2021 1734   KETONESUR 5 (A) 06/17/2021 1734   PROTEINUR 100 (A) 06/17/2021 1734   UROBILINOGEN 0.2 09/13/2013 0315   NITRITE NEGATIVE 06/17/2021 1734   LEUKOCYTESUR NEGATIVE 06/17/2021 1734   Sepsis Labs: @LABRCNTIP (procalcitonin:4,lacticidven:4) ) Recent Results (from the past 240 hour(s))  Blood Culture (routine x 2)     Status: None   Collection Time: 06/17/21  4:23 PM   Specimen: Left Antecubital; Blood  Result Value Ref Range Status   Specimen Description   Final    LEFT ANTECUBITAL BOTTLES DRAWN AEROBIC AND ANAEROBIC   Special Requests Blood Culture adequate volume  Final   Culture   Final    NO GROWTH 5 DAYS Performed at [facility], [address], [place], [place] [address]    Report Status 06/22/2021 FINAL  Final  Resp Panel by RT-PCR (Flu A&B, Covid) Nasopharyngeal Swab     Status: None   Collection Time: 06/17/21  4:44 PM   Specimen: Nasopharyngeal Swab; Nasopharyngeal(NP) swabs in vial transport medium  Result Value Ref Range Status   SARS  Coronavirus 2 by RT PCR NEGATIVE NEGATIVE Final    Comment: (NOTE) SARS-CoV-2 target nucleic acids are NOT DETECTED.  The SARS-CoV-2 RNA is generally detectable in upper respiratory specimens during the acute phase of infection. The lowest concentration of SARS-CoV-2 viral copies this assay can detect is 138 copies/mL. A negative result does not preclude SARS-Cov-2 infection and should not be used as the sole basis for treatment or other patient management decisions. A negative result may occur with  improper specimen collection/handling, submission of specimen other than nasopharyngeal swab, presence of viral mutation(s) within the areas targeted by this assay, and inadequate number of viral copies(<138 copies/mL). A negative result must be combined with clinical observations, patient history, and epidemiological information. The expected result is Negative.  Fact Sheet for Patients:  BloggerCourse.com  Fact Sheet for Healthcare Providers:  SeriousBroker.it  This test is no t yet approved or cleared by the [place] FDA and  has been authorized for detection and/or diagnosis of  SARS-CoV-2 by FDA under an Emergency Use Authorization (EUA). This EUA will remain  in effect (meaning this test can be used) for the duration of the COVID-19 declaration under Section 564(b)(1) of the Act, 21 U.S.C.section 360bbb-3(b)(1), unless the authorization is terminated  or revoked sooner.       Influenza A by PCR NEGATIVE NEGATIVE Final   Influenza B by PCR NEGATIVE NEGATIVE Final    Comment: (NOTE) The Xpert Xpress SARS-CoV-2/FLU/RSV plus assay is intended as an aid in the diagnosis of influenza from Nasopharyngeal swab specimens and should not be used as a sole basis for treatment. Nasal washings and aspirates are unacceptable for Xpert Xpress SARS-CoV-2/FLU/RSV testing.  Fact Sheet for  Patients: BloggerCourse.com  Fact Sheet for Healthcare Providers: SeriousBroker.it  This test is not yet approved or cleared by the [place] FDA and has been authorized for detection and/or diagnosis of SARS-CoV-2 by FDA under an Emergency Use Authorization (EUA). This EUA will remain in effect (meaning this test can be used) for the duration of the COVID-19 declaration under Section 564(b)(1) of the Act, 21 U.S.C. section 360bbb-3(b)(1), unless the authorization is terminated or revoked.  Performed at [facility], [address], [place], [place] [address]   Blood Culture (routine x 2)     Status: None   Collection Time: 06/17/21  4:49 PM   Specimen: Right Antecubital; Blood  Result Value Ref Range Status   Specimen Description   Final    RIGHT ANTECUBITAL BOTTLES DRAWN AEROBIC AND ANAEROBIC   Special Requests Blood Culture adequate volume  Final   Culture   Final    NO GROWTH 5 DAYS Performed at [facility], [address] [place], [place] [address]    Report Status 06/22/2021 FINAL  Final  Urine Culture     Status: None   Collection Time: 06/17/21  5:34 PM   Specimen: In/Out Cath Urine  Result Value Ref Range Status   Specimen Description   Final    IN/OUT CATH URINE Performed at [facility], [address] [place], [place] [address]    Special Requests   Final    NONE Performed at [facility], [address], [place], [place] [address]    Culture   Final    NO GROWTH Performed at [facility], [address], [place], [place] [address]    Report Status 06/19/2021 FINAL  Final  MRSA Next Gen by PCR, Nasal     Status: None   Collection Time: 06/17/21 10:00 PM   Specimen: Nasal Mucosa; Nasal Swab  Result Value Ref Range Status   MRSA by PCR Next Gen NOT DETECTED NOT DETECTED Final    Comment: (NOTE) The GeneXpert MRSA Assay (FDA approved for NASAL specimens only), is one component of a comprehensive  MRSA colonization surveillance program. It is not intended to diagnose MRSA infection nor to guide or monitor treatment for MRSA infections. Test performance is not FDA approved in patients less than 43 years old. Performed at [facility], [address], [place], [place] [address]   Resp Panel by RT-PCR (Flu A&B, Covid) Nasopharyngeal Swab     Status: None   Collection Time: 06/22/21  7:40 PM   Specimen: Nasopharyngeal Swab; Nasopharyngeal(NP) swabs in vial transport medium  Result Value Ref Range Status   SARS Coronavirus 2 by RT PCR NEGATIVE NEGATIVE Final    Comment: (NOTE) SARS-CoV-2 target nucleic acids are NOT DETECTED.  The SARS-CoV-2 RNA is generally detectable in upper respiratory  specimens during the acute phase of infection. The lowest concentration of SARS-CoV-2 viral copies this assay can detect is 138 copies/mL. A negative result does not preclude SARS-Cov-2 infection and should not be used as the sole basis for treatment or other patient management decisions. A negative result may occur with  improper specimen collection/handling, submission of specimen other than nasopharyngeal swab, presence of viral mutation(s) within the areas targeted by this assay, and inadequate number of viral copies(<138 copies/mL). A negative result must be combined with clinical observations, patient history, and epidemiological information. The expected result is Negative.  Fact Sheet for Patients:  BloggerCourse.comhttps://www.fda.gov/media/152166/download  Fact Sheet for Healthcare Providers:  SeriousBroker.ithttps://www.fda.gov/media/152162/download  This test is no t yet approved or cleared by the [place] FDA and  has been authorized for detection and/or diagnosis of SARS-CoV-2 by FDA under an Emergency Use Authorization (EUA). This EUA will remain  in effect (meaning this test can be used) for the duration of the COVID-19 declaration under Section 564(b)(1) of the Act, 21 U.S.C.section 360bbb-3(b)(1),  unless the authorization is terminated  or revoked sooner.       Influenza A by PCR NEGATIVE NEGATIVE Final   Influenza B by PCR NEGATIVE NEGATIVE Final    Comment: (NOTE) The Xpert Xpress SARS-CoV-2/FLU/RSV plus assay is intended as an aid in the diagnosis of influenza from Nasopharyngeal swab specimens and should not be used as a sole basis for treatment. Nasal washings and aspirates are unacceptable for Xpert Xpress SARS-CoV-2/FLU/RSV testing.  Fact Sheet for Patients: BloggerCourse.comhttps://www.fda.gov/media/152166/download  Fact Sheet for Healthcare Providers: SeriousBroker.ithttps://www.fda.gov/media/152162/download  This test is not yet approved or cleared by the [place] FDA and has been authorized for detection and/or diagnosis of SARS-CoV-2 by FDA under an Emergency Use Authorization (EUA). This EUA will remain in effect (meaning this test can be used) for the duration of the COVID-19 declaration under Section 564(b)(1) of the Act, 21 U.S.C. section 360bbb-3(b)(1), unless the authorization is terminated or revoked.  Performed at [facility], [address] [place], [place] [address]      Scheduled Meds:  budesonide (PULMICORT) nebulizer solution  0.5 mg Nebulization BID   enoxaparin (LOVENOX) injection  40 mg Subcutaneous Q24H   ipratropium-albuterol  3 mL Nebulization Q6H   levofloxacin  750 mg Oral Daily   levothyroxine  175 mcg Oral Daily   methylPREDNISolone (SOLU-MEDROL) injection  60 mg Intravenous Q12H   montelukast  10 mg Oral Daily   pantoprazole  40 mg Oral Daily   pregabalin  50 mg Oral TID   Continuous Infusions:  Procedures/Studies: CT Angio Chest PE W and/or Wo Contrast  Result Date: 06/17/2021 CLINICAL DATA:  PE suspected, cough, body aches, wheezing EXAM: CT ANGIOGRAPHY CHEST WITH CONTRAST TECHNIQUE: Multidetector CT imaging of the chest was performed using the standard protocol during bolus administration of intravenous contrast. Multiplanar CT image reconstructions  and MIPs were obtained to evaluate the vascular anatomy. RADIATION DOSE REDUCTION: This exam was performed according to the departmental dose-optimization program which includes automated exposure control, adjustment of the mA and/or kV according to patient size and/or use of iterative reconstruction technique. CONTRAST:  100mL OMNIPAQUE IOHEXOL 350 MG/ML SOLN COMPARISON:  None. FINDINGS: Cardiovascular: Satisfactory opacification of the pulmonary arteries to the segmental level. No evidence of pulmonary embolism. Normal heart size. No pericardial effusion. Mediastinum/Nodes: Numerous prominent mediastinal and hilar lymph nodes. Small hiatal hernia. Thyroid gland, trachea, and esophagus demonstrate no significant findings. Lungs/Pleura: Extensive, somewhat masslike heterogeneous airspace opacity and consolidation in the right  lower lobe, largest component measuring approximately 6.8 x 6.7 cm (series 8, image 75). No pleural effusion or pneumothorax. Upper Abdomen: No acute abnormality.  Status post cholecystectomy. Musculoskeletal: No chest wall abnormality. No acute osseous findings. Review of the MIP images confirms the above findings. IMPRESSION: 1. Negative examination for pulmonary embolism. 2. Extensive, somewhat masslike heterogeneous airspace opacity and consolidation in the right lower lobe, largest component measuring approximately 6.8 x 6.7 cm. Findings are most consistent with lobar pneumonia, however recommend radiographic follow-up in 6-8 weeks at the resolution of symptoms to ensure complete resolution and exclude underlying mass. 3. Numerous prominent mediastinal and hilar lymph nodes, likely reactive. 4. Small hiatal hernia. Electronically Signed   By: Jearld Lesch M.D.   On: 06/17/2021 17:54   DG Chest Portable 1 View  Result Date: 06/22/2021 CLINICAL DATA:  Chest pain and shortness of breath. Recent pneumonia diagnosis. EXAM: PORTABLE CHEST 1 VIEW COMPARISON:  Radiograph 3 days ago, on chest  CT 5 days ago FINDINGS: Persistent confluent right lung base opacity, unchanged or possibly worsened in the interim. Decreased linear opacity in the right mid lung. No new airspace disease. No pneumothorax. Stable osseous structures. IMPRESSION: Persistent confluent right lung base opacity, unchanged or possibly worsened in the interim. Decreased linear opacity in the right mid lung. Electronically Signed   By: Narda Rutherford M.D.   On: 06/22/2021 15:51   DG CHEST PORT 1 VIEW  Result Date: 06/19/2021 CLINICAL DATA:  36 year old female with history of pneumonia. Former smoker. EXAM: PORTABLE CHEST 1 VIEW COMPARISON:  Chest x-ray 06/17/2021. FINDINGS: Lung volumes are slightly low. Worsening airspace consolidation throughout the right mid to lower lung. Small right pleural effusion. Left lung is clear. No evidence of pulmonary edema. Heart size is normal. The patient is rotated to the left on today's exam, resulting in distortion of the mediastinal contours and reduced diagnostic sensitivity and specificity for mediastinal pathology. IMPRESSION: 1. Worsening right-sided multilobar pneumonia involving the right middle and lower lobes with small right parapneumonic pleural effusion. Followup PA and lateral chest X-ray is recommended in 3-4 weeks following trial of antibiotic therapy to ensure resolution and exclude underlying malignancy. Electronically Signed   By: Trudie Reed M.D.   On: 06/19/2021 07:46   DG Chest Port 1 View  Result Date: 06/17/2021 CLINICAL DATA:  Questionable sepsis, cough, wheezing EXAM: PORTABLE CHEST 1 VIEW COMPARISON:  Chest x-ray 08/28/2016 FINDINGS: Heart size is normal. Mediastinum appears stable. Large consolidative opacity in the right lower lobe measuring 7.5 x 7.1 cm. Mildly increased peribronchial opacities bilaterally which appear stable and chronic. No pleural effusion or pneumothorax visualized. IMPRESSION: Large consolidative opacity in the right lower lobe which  likely represents pneumonia. Correlate clinically and follow-up is recommended to ensure resolution. Electronically Signed   By: Jannifer Hick M.D.   On: 06/17/2021 16:46    Catarina Hartshorn, DO  [facility]  If 7PM-7AM, please contact night-coverage [email or web address] Password [email or web address] 06/23/2021, 9:28 AM   LOS: 0 days

## 2021-06-24 DIAGNOSIS — Z720 Tobacco use: Secondary | ICD-10-CM

## 2021-06-24 LAB — BASIC METABOLIC PANEL
Anion gap: 7 (ref 5–15)
BUN: 11 mg/dL (ref 6–20)
CO2: 29 mmol/L (ref 22–32)
Calcium: 9 mg/dL (ref 8.9–10.3)
Chloride: 104 mmol/L (ref 98–111)
Creatinine, Ser: 0.62 mg/dL (ref 0.44–1.00)
GFR, Estimated: >60 mL/min (ref >60)
Glucose, Bld: 215 mg/dL — ABNORMAL HIGH (ref 70–99)
Potassium: 4.9 mmol/L (ref 3.5–5.1)
Sodium: 140 mmol/L (ref 135–145)

## 2021-06-24 LAB — CBC
HCT: 38 % (ref 36.0–46.0)
Hemoglobin: 11.5 g/dL — ABNORMAL LOW (ref 12.0–15.0)
MCH: 27.6 pg (ref 26.0–34.0)
MCHC: 30.3 g/dL (ref 30.0–36.0)
MCV: 91.1 fL (ref 80.0–100.0)
Platelets: 338 10*3/uL (ref 150–400)
RBC: 4.17 MIL/uL (ref 3.87–5.11)
RDW: 13.2 % (ref 11.5–15.5)
WBC: 14.1 10*3/uL — ABNORMAL HIGH (ref 4.0–10.5)
nRBC: 0 % (ref 0.0–0.2)

## 2021-06-24 LAB — MAGNESIUM: Magnesium: 2.2 mg/dL (ref 1.7–2.4)

## 2021-06-24 MED ORDER — PREDNISONE 20 MG PO TABS: 60.0000 mg | ORAL_TABLET | Freq: Every day | ORAL | Status: DC

## 2021-06-24 MED ORDER — PREDNISONE 10 MG PO TABS: 60.0000 mg | ORAL_TABLET | Freq: Every day | ORAL | 0 refills | Status: DC

## 2021-06-24 NOTE — Progress Notes (Signed)
Nsg Discharge Note  Admit Date:  06/22/2021 Discharge date: 06/24/2021   LASHONDA SONNEBORN to be D/C'd Home per MD order.  AVS completed.  Copy for chart, and copy for patient signed, and dated. Patient/caregiver able to verbalize understanding.  Discharge Medication: Allergies as of 06/24/2021       Reactions   Betadine [povidone Iodine] Hives   Ceclor [cefaclor] Hives   Hydrocodone Itching   Morphine And Related    PVCs and "it stops my heart" per patient.   Adhesive [tape] Rash   Latex Rash        Medication List     STOP taking these medications    cetirizine 10 MG tablet Commonly known as: ZyrTEC Allergy       TAKE these medications    albuterol (2.5 MG/3ML) 0.083% nebulizer solution Commonly known as: PROVENTIL Take 3 mLs (2.5 mg total) by nebulization every 4 (four) hours as needed for wheezing or shortness of breath.   ALPRAZolam 1 MG tablet Commonly known as: XANAX Take 1 mg by mouth daily as needed for anxiety.   guaiFENesin 600 MG 12 hr tablet Commonly known as: MUCINEX Take 1 tablet (600 mg total) by mouth 2 (two) times daily.   ibuprofen 200 MG tablet Commonly known as: ADVIL Take 800 mg by mouth every 6 (six) hours as needed.   levofloxacin 750 MG tablet Commonly known as: Levaquin Take 1 tablet (750 mg total) by mouth daily for 7 days.   levothyroxine 175 MCG tablet Commonly known as: SYNTHROID Take 175 mcg by mouth daily.   montelukast 10 MG tablet Commonly known as: SINGULAIR Take 10 mg by mouth daily.   omeprazole 40 MG capsule Commonly known as: PRILOSEC TAKE 1 CAPSULE(40 MG) BY MOUTH DAILY What changed: See the new instructions.   ondansetron 8 MG disintegrating tablet Commonly known as: ZOFRAN-ODT Take 8 mg by mouth 2 (two) times daily as needed for nausea or vomiting.   predniSONE 10 MG tablet Commonly known as: DELTASONE Take 6 tablets (60 mg total) by mouth daily with breakfast. And decrease by one tablet daily Start taking  on: June 25, 2021   pregabalin 50 MG capsule Commonly known as: LYRICA Take 50 mg by mouth 3 (three) times daily.   tiZANidine 4 MG tablet Commonly known as: ZANAFLEX Take 1 tablet (4 mg total) by mouth every 8 (eight) hours as needed for muscle spasms.               Durable Medical Equipment  (From admission, onward)           Start     Ordered   06/24/21 1133  For home use only DME oxygen  Once       Question Answer Comment  Length of Need 6 Months   Liters per Minute 2   Frequency Continuous (stationary and portable oxygen unit needed)   Oxygen conserving device Yes   Oxygen delivery system Gas      06/24/21 1132            Discharge Assessment: Vitals:   06/24/21 0752 06/24/21 0757  BP:    Pulse:    Resp:    Temp:    SpO2: 96% 96%   Skin clean, dry and intact without evidence of skin break down, no evidence of skin tears noted. IV catheter discontinued intact. Site without signs and symptoms of complications - no redness or edema noted at insertion site, patient denies c/o pain - only  slight tenderness at site.  Dressing with slight pressure applied.  D/c Instructions-Education: Discharge instructions given to patient/family with verbalized understanding. D/c education completed with patient/family including follow up instructions, medication list, d/c activities limitations if indicated, with other d/c instructions as indicated by MD - patient able to verbalize understanding, all questions fully answered. Patient instructed to return to ED, call 911, or call MD for any changes in condition.  Patient escorted via WC, and D/C home via private auto.  Brandy Hale, LPN 11/22/3149 7:61 PM

## 2021-06-24 NOTE — Progress Notes (Addendum)
SATURATION QUALIFICATIONS: (This note is used to comply with regulatory documentation for home oxygen)   Patient Saturations on Room Air at Rest = 84%   Patient Saturations on Room Air while Ambulating = NA   Patient Saturations on 2 Liters of oxygen while ambulating = 96%   Please briefly explain why patient needs home oxygen: To maintain 02 sat at 90% or above during ambulation.   [name],DO

## 2021-06-24 NOTE — Progress Notes (Signed)
Pt's O2 sustaining at 84%. 2L Nasal Cannula placed on pt at 0015.

## 2021-06-24 NOTE — Discharge Summary (Signed)
Physician Discharge Summary  Teresa [name] ZHY:[id number] DOB: 11-18-1985 DOA: 06/22/2021  PCP: Marshia Ly, PA-C  Admit date: 06/22/2021 Discharge date: 06/24/2021  Admitted From: Home Disposition:  Home  Recommendations for Outpatient Follow-up:  Follow up with PCP in 1-2 weeks Please obtain BMP/CBC in one week    Equipment/Devices:night time oxygen 2L  Discharge Condition: Stable CODE STATUS: FULL Diet recommendation: Heart Healthy / Carb Modified / Dysphagia / Regular   Brief/Interim Summary: 36 year old female with a history of hypertension, hypothyroidism, GERD, asthma presenting with general malaise, generalized weakness, shortness of breath, coughing, and wheezing for 2 days.  The patient was recently discharged from the hospital after a stay from 06/17/2021 to 06/19/2021 when she was treated for pneumonia.  The patient was treated with levofloxacin and discharged home with an additional week of levofloxacin.  She stated that about 2 days after she returned home she began having worsening symptoms as discussed above.  Apparently, she continued to have oxygen desaturation during her sleep.  She is coughing with yellow-green sputum.  She denies any fevers, chills, nausea, vomiting, diarrhea, abdominal pain, dysuria.  Because of her worsening shortness of breath, coughing, and malaise, she presented for further evaluation. In the ED, the patient was afebrile hemodynamically stable with oxygen saturation 92% on room air.  With ambulation, the patient desaturated to 88%.  WBC 10.5, hemoglobin 11.6, platelets 272,000.  Sodium 140, potassium 3.4, serum creatinine 0.71.  The patient was restarted on levofloxacin.  She was started on IV solumedrol with clinical improvement.  Discharge Diagnoses:  Acute respiratory failure with hypoxia -secondary to asthma exacerbation in the setting on pneumonia -Wean oxygen for saturation greater than 92%. -ambulatory pulse ox on day of d/c did not  show oxygen desaturation -pt did have night time oxygen desaturation down to 84%>>set home night time oxygen -referral to pulmonary for polysomnogram   Acute asthma exacerbation -Started Pulmicort -Started nebulized bronchodilators -Started IV Solu-Medrol -d/c home with prednisone taper -she already has home neb machine with BDs>>continue at home   Atypical chest pain -Troponins unremarkable -Personally reviewed EKG--sinus rhythm, T wave inversion V1-V3 -Echo EF 60-65%, no WMA, mild decrease RV function; trivial MR/TR   Lobar pneumonia -Continue levofloxacin--finish supply from last hospital discharge -PCT <0.10   Morbid obesity -BMI 44.68 20 -Lifestyle modification   Tobacco abuse -Tobacco cessation discussed -Last smoked on 06/16/2021   Hypokalemia -Repleted   Hypothyroidism -Continue levothyroxine   GERD -Continue pantoprazole     Discharge Instructions   Allergies as of 06/24/2021       Reactions   Betadine [povidone Iodine] Hives   Ceclor [cefaclor] Hives   Hydrocodone Itching   Morphine And Related    PVCs and "it stops my heart" per patient.   Adhesive [tape] Rash   Latex Rash        Medication List     STOP taking these medications    cetirizine 10 MG tablet Commonly known as: ZyrTEC Allergy       TAKE these medications    albuterol (2.5 MG/3ML) 0.083% nebulizer solution Commonly known as: PROVENTIL Take 3 mLs (2.5 mg total) by nebulization every 4 (four) hours as needed for wheezing or shortness of breath.   ALPRAZolam 1 MG tablet Commonly known as: XANAX Take 1 mg by mouth daily as needed for anxiety.   guaiFENesin 600 MG 12 hr tablet Commonly known as: MUCINEX Take 1 tablet (600 mg total) by mouth 2 (two) times daily.   ibuprofen 200  MG tablet Commonly known as: ADVIL Take 800 mg by mouth every 6 (six) hours as needed.   levofloxacin 750 MG tablet Commonly known as: Levaquin Take 1 tablet (750 mg total) by mouth daily for  7 days.   levothyroxine 175 MCG tablet Commonly known as: SYNTHROID Take 175 mcg by mouth daily.   montelukast 10 MG tablet Commonly known as: SINGULAIR Take 10 mg by mouth daily.   omeprazole 40 MG capsule Commonly known as: PRILOSEC TAKE 1 CAPSULE(40 MG) BY MOUTH DAILY What changed: See the new instructions.   ondansetron 8 MG disintegrating tablet Commonly known as: ZOFRAN-ODT Take 8 mg by mouth 2 (two) times daily as needed for nausea or vomiting.   predniSONE 10 MG tablet Commonly known as: DELTASONE Take 6 tablets (60 mg total) by mouth daily with breakfast. And decrease by one tablet daily Start taking on: June 25, 2021   pregabalin 50 MG capsule Commonly known as: LYRICA Take 50 mg by mouth 3 (three) times daily.   tiZANidine 4 MG tablet Commonly known as: ZANAFLEX Take 1 tablet (4 mg total) by mouth every 8 (eight) hours as needed for muscle spasms.               Durable Medical Equipment  (From admission, onward)           Start     Ordered   06/24/21 0906  For home use only DME oxygen  Once       Question Answer Comment  Length of Need 6 Months   Liters per Minute 2   Frequency Only at night (stationary unit needed)   Oxygen conserving device Yes   Oxygen delivery system Gas      06/24/21 0905            Allergies  Allergen Reactions   Betadine [Povidone Iodine] Hives   Ceclor [Cefaclor] Hives   Hydrocodone Itching   Morphine And Related     PVCs and "it stops my heart" per patient.   Adhesive [Tape] Rash   Latex Rash    Consultations: none   Procedures/Studies: CT Angio Chest PE W and/or Wo Contrast  Result Date: 06/17/2021 CLINICAL DATA:  PE suspected, cough, body aches, wheezing EXAM: CT ANGIOGRAPHY CHEST WITH CONTRAST TECHNIQUE: Multidetector CT imaging of the chest was performed using the standard protocol during bolus administration of intravenous contrast. Multiplanar CT image reconstructions and MIPs were obtained  to evaluate the vascular anatomy. RADIATION DOSE REDUCTION: This exam was performed according to the departmental dose-optimization program which includes automated exposure control, adjustment of the mA and/or kV according to patient size and/or use of iterative reconstruction technique. CONTRAST:  OMNIPAQUE IOHEXOL 350 MG/ML SOLN COMPARISON:  None. FINDINGS: Cardiovascular: Satisfactory opacification of the pulmonary arteries to the segmental level. No evidence of pulmonary embolism. Normal heart size. No pericardial effusion. Mediastinum/Nodes: Numerous prominent mediastinal and hilar lymph nodes. Small hiatal hernia. Thyroid gland, trachea, and esophagus demonstrate no significant findings. Lungs/Pleura: Extensive, somewhat masslike heterogeneous airspace opacity and consolidation in the right lower lobe, largest component measuring approximately 6.8 x 6.7 cm (series 8, image 75). No pleural effusion or pneumothorax. Upper Abdomen: No acute abnormality.  Status post cholecystectomy. Musculoskeletal: No chest wall abnormality. No acute osseous findings. Review of the MIP images confirms the above findings. IMPRESSION: 1. Negative examination for pulmonary embolism. 2. Extensive, somewhat masslike heterogeneous airspace opacity and consolidation in the right lower lobe, largest component measuring approximately 6.8 x 6.7 cm. Findings are  most consistent with lobar pneumonia, however recommend radiographic follow-up in 6-8 weeks at the resolution of symptoms to ensure complete resolution and exclude underlying mass. 3. Numerous prominent mediastinal and hilar lymph nodes, likely reactive. 4. Small hiatal hernia. Electronically Signed   By: Jearld Lesch M.D.   On: 06/17/2021 17:54   DG Chest Portable 1 View  Result Date: 06/22/2021 CLINICAL DATA:  Chest pain and shortness of breath. Recent pneumonia diagnosis. EXAM: PORTABLE CHEST 1 VIEW COMPARISON:  Radiograph 3 days ago, on chest CT 5 days ago FINDINGS:  Persistent confluent right lung base opacity, unchanged or possibly worsened in the interim. Decreased linear opacity in the right mid lung. No new airspace disease. No pneumothorax. Stable osseous structures. IMPRESSION: Persistent confluent right lung base opacity, unchanged or possibly worsened in the interim. Decreased linear opacity in the right mid lung. Electronically Signed   By: Narda Rutherford M.D.   On: 06/22/2021 15:51   DG CHEST PORT 1 VIEW  Result Date: 06/19/2021 CLINICAL DATA:  36 year old female with history of pneumonia. Former smoker. EXAM: PORTABLE CHEST 1 VIEW COMPARISON:  Chest x-ray 06/17/2021. FINDINGS: Lung volumes are slightly low. Worsening airspace consolidation throughout the right mid to lower lung. Small right pleural effusion. Left lung is clear. No evidence of pulmonary edema. Heart size is normal. The patient is rotated to the left on today's exam, resulting in distortion of the mediastinal contours and reduced diagnostic sensitivity and specificity for mediastinal pathology. IMPRESSION: 1. Worsening right-sided multilobar pneumonia involving the right middle and lower lobes with small right parapneumonic pleural effusion. Followup PA and lateral chest X-ray is recommended in 3-4 weeks following trial of antibiotic therapy to ensure resolution and exclude underlying malignancy. Electronically Signed   By: Trudie Reed M.D.   On: 06/19/2021 07:46   DG Chest Port 1 View  Result Date: 06/17/2021 CLINICAL DATA:  Questionable sepsis, cough, wheezing EXAM: PORTABLE CHEST 1 VIEW COMPARISON:  Chest x-ray 08/28/2016 FINDINGS: Heart size is normal. Mediastinum appears stable. Large consolidative opacity in the right lower lobe measuring 7.5 x 7.1 cm. Mildly increased peribronchial opacities bilaterally which appear stable and chronic. No pleural effusion or pneumothorax visualized. IMPRESSION: Large consolidative opacity in the right lower lobe which likely represents pneumonia.  Correlate clinically and follow-up is recommended to ensure resolution. Electronically Signed   By: Jannifer Hick M.D.   On: 06/17/2021 16:46   ECHOCARDIOGRAM COMPLETE  Result Date: 06/23/2021    ECHOCARDIOGRAM REPORT   Patient Name:   MEHER KUCINSKI Pinela Date of Exam: 06/23/2021 Medical Rec #:  [id number]      Height:       65.0 in Accession #:    [id number]     Weight:       268.5 lb Date of Birth:  [birth year]      BSA:          2.242 m Patient Age:    35 years       BP:           155/80 mmHg Patient Gender: F              HR:           94 bpm. Exam Location:  Inpatient Procedure: 2D Echo, Cardiac Doppler and Color Doppler Indications:    R06.02 SOB  History:        Patient has prior history of Echocardiogram examinations, most  recent 11/19/2019. Abnormal ECG, Arrythmias:PVC; Risk                 Factors:Current Smoker. Pneumonia.  Sonographer:    Sheralyn Boatman RDCS Referring Phys: [phone] Austen Oyster  Sonographer Comments: Patient is morbidly obese. Image acquisition challenging due to uncooperative patient and Image acquisition challenging due to patient body habitus. Patient complained of pain in apical region due to pressure from probe. Patient stated her ribs hurt due to coughing. Exam ended. IMPRESSIONS  1. Limited study.  2. Left ventricular ejection fraction, by estimation, is 60 to 65%. The left ventricle has normal function. The left ventricle has no regional wall motion abnormalities. Left ventricular diastolic parameters are indeterminate.  3. Right ventricular systolic function is mildly reduced. The right ventricular size is normal. Tricuspid regurgitation signal is inadequate for assessing PA pressure.  4. There is a trivial pericardial effusion posterior to the left ventricle and anterior to the right ventricle.  5. The mitral valve is grossly normal. Trivial mitral valve regurgitation.  6. The aortic valve is tricuspid. Aortic valve regurgitation is not visualized.  7. Unable to estimate  CVP. Comparison(s): Prior images reviewed side by side. LVEF remains normal range at 60-65%, Mild RV dysfunction is stable. FINDINGS  Left Ventricle: Left ventricular ejection fraction, by estimation, is 60 to 65%. The left ventricle has normal function. The left ventricle has no regional wall motion abnormalities. The left ventricular internal cavity size was normal in size. There is  borderline left ventricular hypertrophy. Left ventricular diastolic parameters are indeterminate. Right Ventricle: The right ventricular size is normal. No increase in right ventricular wall thickness. Right ventricular systolic function is mildly reduced. Tricuspid regurgitation signal is inadequate for assessing PA pressure. Left Atrium: Left atrial size was normal in size. Right Atrium: Right atrial size was normal in size. Pericardium: Trivial pericardial effusion is present. The pericardial effusion is posterior to the left ventricle and anterior to the right ventricle. Mitral Valve: The mitral valve is grossly normal. Trivial mitral valve regurgitation. Tricuspid Valve: The tricuspid valve is grossly normal. Tricuspid valve regurgitation is trivial. Aortic Valve: The aortic valve is tricuspid. Aortic valve regurgitation is not visualized. Pulmonic Valve: The pulmonic valve was grossly normal. Pulmonic valve regurgitation is mild. Aorta: The aortic root is normal in size and structure. Venous: Unable to estimate CVP. The inferior vena cava was not well visualized. IAS/Shunts: The interatrial septum was not assessed.  LEFT VENTRICLE PLAX 2D LVIDd:         4.60 cm LVIDs:         3.00 cm LV PW:         1.00 cm LV IVS:        1.20 cm LVOT diam:     2.10 cm LVOT Area:     3.46 cm  LEFT ATRIUM         Index LA diam:    3.70 cm 1.65 cm/m                        PULMONIC VALVE AORTA                 PR End Diast Vel: 2.06 msec Ao Root diam: 3.20 cm Ao Asc diam:  3.10 cm  SHUNTS Systemic Diam: 2.10 cm Nona Dell MD Electronically  signed by Nona Dell MD Signature Date/Time: 06/23/2021/11:33:37 AM    Final         Discharge Exam: Vitals:  06/24/21 0752 06/24/21 0757  BP:    Pulse:    Resp:    Temp:    SpO2: 96% 96%   Vitals:   06/24/21 0017 06/24/21 0537 06/24/21 0752 06/24/21 0757  BP:  (!) 147/90    Pulse:  (!) 110    Resp:  18    Temp:  98.3 F (36.8 C)    TempSrc:      SpO2: 92% 96% 96% 96%  Weight:      Height:        General: Pt is alert, awake, not in acute distress Cardiovascular: RRR, S1/S2 +, no rubs, no gallops Respiratory: bibasilar rales.  Minimal bibasilar wheeze Abdominal: Soft, NT, ND, bowel sounds + Extremities: no edema, no cyanosis   The results of significant diagnostics from this hospitalization (including imaging, microbiology, ancillary and laboratory) are listed below for reference.    Significant Diagnostic Studies: CT Angio Chest PE W and/or Wo Contrast  Result Date: 06/17/2021 CLINICAL DATA:  PE suspected, cough, body aches, wheezing EXAM: CT ANGIOGRAPHY CHEST WITH CONTRAST TECHNIQUE: Multidetector CT imaging of the chest was performed using the standard protocol during bolus administration of intravenous contrast. Multiplanar CT image reconstructions and MIPs were obtained to evaluate the vascular anatomy. RADIATION DOSE REDUCTION: This exam was performed according to the departmental dose-optimization program which includes automated exposure control, adjustment of the mA and/or kV according to patient size and/or use of iterative reconstruction technique. CONTRAST:  100mL OMNIPAQUE IOHEXOL 350 MG/ML SOLN COMPARISON:  None. FINDINGS: Cardiovascular: Satisfactory opacification of the pulmonary arteries to the segmental level. No evidence of pulmonary embolism. Normal heart size. No pericardial effusion. Mediastinum/Nodes: Numerous prominent mediastinal and hilar lymph nodes. Small hiatal hernia. Thyroid gland, trachea, and esophagus demonstrate no significant findings.  Lungs/Pleura: Extensive, somewhat masslike heterogeneous airspace opacity and consolidation in the right lower lobe, largest component measuring approximately 6.8 x 6.7 cm (series 8, image 75). No pleural effusion or pneumothorax. Upper Abdomen: No acute abnormality.  Status post cholecystectomy. Musculoskeletal: No chest wall abnormality. No acute osseous findings. Review of the MIP images confirms the above findings. IMPRESSION: 1. Negative examination for pulmonary embolism. 2. Extensive, somewhat masslike heterogeneous airspace opacity and consolidation in the right lower lobe, largest component measuring approximately 6.8 x 6.7 cm. Findings are most consistent with lobar pneumonia, however recommend radiographic follow-up in 6-8 weeks at the resolution of symptoms to ensure complete resolution and exclude underlying mass. 3. Numerous prominent mediastinal and hilar lymph nodes, likely reactive. 4. Small hiatal hernia. Electronically Signed   By: Jearld LeschAlex D Bibbey M.D.   On: 06/17/2021 17:54   DG Chest Portable 1 View  Result Date: 06/22/2021 CLINICAL DATA:  Chest pain and shortness of breath. Recent pneumonia diagnosis. EXAM: PORTABLE CHEST 1 VIEW COMPARISON:  Radiograph 3 days ago, on chest CT 5 days ago FINDINGS: Persistent confluent right lung base opacity, unchanged or possibly worsened in the interim. Decreased linear opacity in the right mid lung. No new airspace disease. No pneumothorax. Stable osseous structures. IMPRESSION: Persistent confluent right lung base opacity, unchanged or possibly worsened in the interim. Decreased linear opacity in the right mid lung. Electronically Signed   By: Narda RutherfordMelanie  Sanford M.D.   On: 06/22/2021 15:51   DG CHEST PORT 1 VIEW  Result Date: 06/19/2021 CLINICAL DATA:  36 year old female with history of pneumonia. Former smoker. EXAM: PORTABLE CHEST 1 VIEW COMPARISON:  Chest x-ray 06/17/2021. FINDINGS: Lung volumes are slightly low. Worsening airspace consolidation  throughout the right mid to  lower lung. Small right pleural effusion. Left lung is clear. No evidence of pulmonary edema. Heart size is normal. The patient is rotated to the left on today's exam, resulting in distortion of the mediastinal contours and reduced diagnostic sensitivity and specificity for mediastinal pathology. IMPRESSION: 1. Worsening right-sided multilobar pneumonia involving the right middle and lower lobes with small right parapneumonic pleural effusion. Followup PA and lateral chest X-ray is recommended in 3-4 weeks following trial of antibiotic therapy to ensure resolution and exclude underlying malignancy. Electronically Signed   By: Trudie Reed M.D.   On: 06/19/2021 07:46   DG Chest Port 1 View  Result Date: 06/17/2021 CLINICAL DATA:  Questionable sepsis, cough, wheezing EXAM: PORTABLE CHEST 1 VIEW COMPARISON:  Chest x-ray 08/28/2016 FINDINGS: Heart size is normal. Mediastinum appears stable. Large consolidative opacity in the right lower lobe measuring 7.5 x 7.1 cm. Mildly increased peribronchial opacities bilaterally which appear stable and chronic. No pleural effusion or pneumothorax visualized. IMPRESSION: Large consolidative opacity in the right lower lobe which likely represents pneumonia. Correlate clinically and follow-up is recommended to ensure resolution. Electronically Signed   By: Jannifer Hick M.D.   On: 06/17/2021 16:46   ECHOCARDIOGRAM COMPLETE  Result Date: 06/23/2021    ECHOCARDIOGRAM REPORT   Patient Name:   MEHA VIDRINE Berrones Date of Exam: 06/23/2021 Medical Rec #:  [id number]      Height:       65.0 in Accession #:    [id number]     Weight:       268.5 lb Date of Birth:  Dec 13, 1985      BSA:          2.242 m Patient Age:    35 years       BP:           155/80 mmHg Patient Gender: F              HR:           94 bpm. Exam Location:  Inpatient Procedure: 2D Echo, Cardiac Doppler and Color Doppler Indications:    R06.02 SOB  History:        Patient has prior history  of Echocardiogram examinations, most                 recent 11/19/2019. Abnormal ECG, Arrythmias:PVC; Risk                 Factors:Current Smoker. Pneumonia.  Sonographer:    Sheralyn Boatman RDCS Referring Phys: [phone] Chaddrick Brue  Sonographer Comments: Patient is morbidly obese. Image acquisition challenging due to uncooperative patient and Image acquisition challenging due to patient body habitus. Patient complained of pain in apical region due to pressure from probe. Patient stated her ribs hurt due to coughing. Exam ended. IMPRESSIONS  1. Limited study.  2. Left ventricular ejection fraction, by estimation, is 60 to 65%. The left ventricle has normal function. The left ventricle has no regional wall motion abnormalities. Left ventricular diastolic parameters are indeterminate.  3. Right ventricular systolic function is mildly reduced. The right ventricular size is normal. Tricuspid regurgitation signal is inadequate for assessing PA pressure.  4. There is a trivial pericardial effusion posterior to the left ventricle and anterior to the right ventricle.  5. The mitral valve is grossly normal. Trivial mitral valve regurgitation.  6. The aortic valve is tricuspid. Aortic valve regurgitation is not visualized.  7. Unable to estimate CVP. Comparison(s): Prior images reviewed side by side. LVEF remains normal  range at 60-65%, Mild RV dysfunction is stable. FINDINGS  Left Ventricle: Left ventricular ejection fraction, by estimation, is 60 to 65%. The left ventricle has normal function. The left ventricle has no regional wall motion abnormalities. The left ventricular internal cavity size was normal in size. There is  borderline left ventricular hypertrophy. Left ventricular diastolic parameters are indeterminate. Right Ventricle: The right ventricular size is normal. No increase in right ventricular wall thickness. Right ventricular systolic function is mildly reduced. Tricuspid regurgitation signal is inadequate for assessing  PA pressure. Left Atrium: Left atrial size was normal in size. Right Atrium: Right atrial size was normal in size. Pericardium: Trivial pericardial effusion is present. The pericardial effusion is posterior to the left ventricle and anterior to the right ventricle. Mitral Valve: The mitral valve is grossly normal. Trivial mitral valve regurgitation. Tricuspid Valve: The tricuspid valve is grossly normal. Tricuspid valve regurgitation is trivial. Aortic Valve: The aortic valve is tricuspid. Aortic valve regurgitation is not visualized. Pulmonic Valve: The pulmonic valve was grossly normal. Pulmonic valve regurgitation is mild. Aorta: The aortic root is normal in size and structure. Venous: Unable to estimate CVP. The inferior vena cava was not well visualized. IAS/Shunts: The interatrial septum was not assessed.  LEFT VENTRICLE PLAX 2D LVIDd:         4.60 cm LVIDs:         3.00 cm LV PW:         1.00 cm LV IVS:        1.20 cm LVOT diam:     2.10 cm LVOT Area:     3.46 cm  LEFT ATRIUM         Index LA diam:    3.70 cm 1.65 cm/m                        PULMONIC VALVE AORTA                 PR End Diast Vel: 2.06 msec Ao Root diam: 3.20 cm Ao Asc diam:  3.10 cm  SHUNTS Systemic Diam: 2.10 cm Nona Dell MD Electronically signed by Nona Dell MD Signature Date/Time: 06/23/2021/11:33:37 AM    Final     Microbiology: Recent Results (from the past 240 hour(s))  Blood Culture (routine x 2)     Status: None   Collection Time: 06/17/21  4:23 PM   Specimen: Left Antecubital; Blood  Result Value Ref Range Status   Specimen Description   Final    LEFT ANTECUBITAL BOTTLES DRAWN AEROBIC AND ANAEROBIC   Special Requests Blood Culture adequate volume  Final   Culture   Final    NO GROWTH 5 DAYS Performed at [facility], [address], [place], [place] [address]    Report Status 06/22/2021 FINAL  Final  Resp Panel by RT-PCR (Flu A&B, Covid) Nasopharyngeal Swab     Status: None   Collection Time: 06/17/21   4:44 PM   Specimen: Nasopharyngeal Swab; Nasopharyngeal(NP) swabs in vial transport medium  Result Value Ref Range Status   SARS Coronavirus 2 by RT PCR NEGATIVE NEGATIVE Final    Comment: (NOTE) SARS-CoV-2 target nucleic acids are NOT DETECTED.  The SARS-CoV-2 RNA is generally detectable in upper respiratory specimens during the acute phase of infection. The lowest concentration of SARS-CoV-2 viral copies this assay can detect is 138 copies/mL. A negative result does not preclude SARS-Cov-2 infection and should not be used as the sole basis for treatment  or other patient management decisions. A negative result may occur with  improper specimen collection/handling, submission of specimen other than nasopharyngeal swab, presence of viral mutation(s) within the areas targeted by this assay, and inadequate number of viral copies(<138 copies/mL). A negative result must be combined with clinical observations, patient history, and epidemiological information. The expected result is Negative.  Fact Sheet for Patients:  BloggerCourse.comhttps://www.fda.gov/media/152166/download  Fact Sheet for Healthcare Providers:  SeriousBroker.ithttps://www.fda.gov/media/152162/download  This test is no t yet approved or cleared by the [place] FDA and  has been authorized for detection and/or diagnosis of SARS-CoV-2 by FDA under an Emergency Use Authorization (EUA). This EUA will remain  in effect (meaning this test can be used) for the duration of the COVID-19 declaration under Section 564(b)(1) of the Act, 21 U.S.C.section 360bbb-3(b)(1), unless the authorization is terminated  or revoked sooner.       Influenza A by PCR NEGATIVE NEGATIVE Final   Influenza B by PCR NEGATIVE NEGATIVE Final    Comment: (NOTE) The Xpert Xpress SARS-CoV-2/FLU/RSV plus assay is intended as an aid in the diagnosis of influenza from Nasopharyngeal swab specimens and should not be used as a sole basis for treatment. Nasal washings and aspirates  are unacceptable for Xpert Xpress SARS-CoV-2/FLU/RSV testing.  Fact Sheet for Patients: BloggerCourse.comhttps://www.fda.gov/media/152166/download  Fact Sheet for Healthcare Providers: SeriousBroker.ithttps://www.fda.gov/media/152162/download  This test is not yet approved or cleared by the [place] FDA and has been authorized for detection and/or diagnosis of SARS-CoV-2 by FDA under an Emergency Use Authorization (EUA). This EUA will remain in effect (meaning this test can be used) for the duration of the COVID-19 declaration under Section 564(b)(1) of the Act, 21 U.S.C. section 360bbb-3(b)(1), unless the authorization is terminated or revoked.  Performed at [facility], [address] [place], [place] [address]   Blood Culture (routine x 2)     Status: None   Collection Time: 06/17/21  4:49 PM   Specimen: Right Antecubital; Blood  Result Value Ref Range Status   Specimen Description   Final    RIGHT ANTECUBITAL BOTTLES DRAWN AEROBIC AND ANAEROBIC   Special Requests Blood Culture adequate volume  Final   Culture   Final    NO GROWTH 5 DAYS Performed at [facility], [address] [place], [place] [address]    Report Status 06/22/2021 FINAL  Final  Urine Culture     Status: None   Collection Time: 06/17/21  5:34 PM   Specimen: In/Out Cath Urine  Result Value Ref Range Status   Specimen Description   Final    IN/OUT CATH URINE Performed at [facility], [address] [place], [place] [address]    Special Requests   Final    NONE Performed at [facility], [address] [place], [place] [address]    Culture   Final    NO GROWTH Performed at [facility], [address] [place], [place] [address]    Report Status 06/19/2021 FINAL  Final  MRSA Next Gen by PCR, Nasal     Status: None   Collection Time: 06/17/21 10:00 PM   Specimen: Nasal Mucosa; Nasal Swab  Result Value Ref Range Status   MRSA by PCR Next Gen NOT DETECTED NOT DETECTED Final    Comment: (NOTE) The GeneXpert MRSA Assay  (FDA approved for NASAL specimens only), is one component of a comprehensive MRSA colonization surveillance program. It is not intended to diagnose MRSA infection nor to guide or monitor treatment for MRSA infections.  Test performance is not FDA approved in patients less than 69 years old. Performed at [facility], [address] [place], [place] [address]   Resp Panel by RT-PCR (Flu A&B, Covid) Nasopharyngeal Swab     Status: None   Collection Time: 06/22/21  7:40 PM   Specimen: Nasopharyngeal Swab; Nasopharyngeal(NP) swabs in vial transport medium  Result Value Ref Range Status   SARS Coronavirus 2 by RT PCR NEGATIVE NEGATIVE Final    Comment: (NOTE) SARS-CoV-2 target nucleic acids are NOT DETECTED.  The SARS-CoV-2 RNA is generally detectable in upper respiratory specimens during the acute phase of infection. The lowest concentration of SARS-CoV-2 viral copies this assay can detect is 138 copies/mL. A negative result does not preclude SARS-Cov-2 infection and should not be used as the sole basis for treatment or other patient management decisions. A negative result may occur with  improper specimen collection/handling, submission of specimen other than nasopharyngeal swab, presence of viral mutation(s) within the areas targeted by this assay, and inadequate number of viral copies(<138 copies/mL). A negative result must be combined with clinical observations, patient history, and epidemiological information. The expected result is Negative.  Fact Sheet for Patients:  BloggerCourse.com  Fact Sheet for Healthcare Providers:  SeriousBroker.it  This test is no t yet approved or cleared by the [place] FDA and  has been authorized for detection and/or diagnosis of SARS-CoV-2 by FDA under an Emergency Use Authorization (EUA). This EUA will remain  in effect (meaning this test can be used) for the duration of the COVID-19  declaration under Section 564(b)(1) of the Act, 21 U.S.C.section 360bbb-3(b)(1), unless the authorization is terminated  or revoked sooner.       Influenza A by PCR NEGATIVE NEGATIVE Final   Influenza B by PCR NEGATIVE NEGATIVE Final    Comment: (NOTE) The Xpert Xpress SARS-CoV-2/FLU/RSV plus assay is intended as an aid in the diagnosis of influenza from Nasopharyngeal swab specimens and should not be used as a sole basis for treatment. Nasal washings and aspirates are unacceptable for Xpert Xpress SARS-CoV-2/FLU/RSV testing.  Fact Sheet for Patients: BloggerCourse.com  Fact Sheet for Healthcare Providers: SeriousBroker.it  This test is not yet approved or cleared by the [place] FDA and has been authorized for detection and/or diagnosis of SARS-CoV-2 by FDA under an Emergency Use Authorization (EUA). This EUA will remain in effect (meaning this test can be used) for the duration of the COVID-19 declaration under Section 564(b)(1) of the Act, 21 U.S.C. section 360bbb-3(b)(1), unless the authorization is terminated or revoked.  Performed at [facility], [address] [place], [place] [address]      Labs: Basic Metabolic Panel: Recent Labs  Lab 06/18/21 0429 06/19/21 0554 06/22/21 1539 06/23/21 0523 06/24/21 0515  NA 137 135 140 144 140  K 4.0 4.0 3.4* 3.8 4.9  CL 106 103 101 101 104  CO2 27 26 28  32 29  GLUCOSE 127* 140* 100* 108* 215*  BUN 8 7 9 12 11   CREATININE 0.74 0.54 0.71 0.68 0.62  CALCIUM 7.7* 8.5* 8.9 8.7* 9.0  MG  --   --  1.8  --  2.2   Liver Function Tests: Recent Labs  Lab 06/17/21 1640 06/19/21 0554  AST 12* 9*  ALT 14 13  ALKPHOS 81 79  BILITOT 0.6 0.2*  PROT 7.4 6.3*  ALBUMIN 3.5 2.7*   No results for input(s): LIPASE, AMYLASE in the last 168 hours. No results for input(s): AMMONIA in the last 168 hours.  CBC: Recent Labs  Lab 06/17/21 1640 06/18/21 0429 06/19/21 0554  06/22/21 1539 06/23/21 0523 06/24/21 0515  WBC 22.7* 21.8* 11.6* 10.5 11.0* 14.1*  NEUTROABS 18.6*  --   --  6.9  --   --   HGB 13.2 10.0* 9.7* 11.6* 11.5* 11.5*  HCT 43.0 33.8* 31.5* 37.1 38.2 38.0  MCV 94.5 94.4 94.6 91.8 90.7 91.1  PLT 236 205 180 272 289 338   Cardiac Enzymes: No results for input(s): CKTOTAL, CKMB, CKMBINDEX, TROPONINI in the last 168 hours. BNP: Invalid input(s): POCBNP CBG: No results for input(s): GLUCAP in the last 168 hours.  Time coordinating discharge:  36 minutes  Signed:  Catarina Hartshorn, DO [facility] Pager: [phone] 06/24/2021, 9:31 AM

## 2021-06-24 NOTE — TOC Transition Note (Signed)
Transition of Care [facility]) - CM/SW Discharge Note   Patient Details  Name: Teresa [name] MRN: [id number] Date of Birth: 1985-06-24  Transition of [facility]) CM/SW Contact:  Villa Herb, LCSWA Phone Number: 06/24/2021, 12:14 PM   Clinical Narrative:    CSW spoke to [place] with [employer] who states that pt qualifies for O2 based on the sat qual note. Orders for home O2 were placed. [employer] will work on getting O2 set up in the home for pt. TOC signing off.     Barriers to Discharge: Continued Medical Work up   Patient Goals and CMS Choice Patient states their goals for this hospitalization and ongoing recovery are:: to get better CMS Medicare.gov Compare Post Acute Care list provided to:: Patient Choice offered to / list presented to : Patient  Discharge Placement                       Discharge Plan and Services     Post Acute Care Choice: Durable Medical Equipment          DME Arranged: Oxygen DME Agency: [employer] Date DME Agency Contacted: 06/23/21 Time DME Agency Contacted: 1604 Representative spoke with at DME Agency: Morrie Sheldon            Social Determinants of Health (SDOH) Interventions     Readmission Risk Interventions No flowsheet data found.

## 2021-06-27 ENCOUNTER — Telehealth: Payer: Self-pay

## 2021-06-27 NOTE — Telephone Encounter (Signed)
-----   Message from Chesley Mires, MD sent at 06/24/2021 10:31 AM EST ----- Regarding: RE: Outpatient consult Kiarrah Rausch,  Ms. Pharris is being discharged from [facility] on 06/24/21.  Can you call her and schedule a consult visit with Dr. Elsworth Soho or myself for OSA/OHS.  Thanks.  V   ----- Message ----- From: Orson Eva, MD Sent: 06/24/2021  10:08 AM EST To: Chesley Mires, MD, Rigoberto Noel, MD Subject: Outpatient consult                             Good morning,  Can you please have your staff set up an appointment for this patient as an outpatient consult.  She likely has OSA/OHS and will need polysomnogram.  Thank you,  Shanon Brow

## 2021-06-27 NOTE — Telephone Encounter (Signed)
Patient is scheduled for consult for sleep on 08/04/2021.

## 2021-07-05 ENCOUNTER — Ambulatory Visit (INDEPENDENT_AMBULATORY_CARE_PROVIDER_SITE_OTHER): Payer: BC Managed Care – PPO | Admitting: Pulmonary Disease

## 2021-07-05 ENCOUNTER — Other Ambulatory Visit: Payer: Self-pay

## 2021-07-05 ENCOUNTER — Encounter: Payer: Self-pay | Admitting: Pulmonary Disease

## 2021-07-05 VITALS — BP 142/84 | HR 93 | Temp 98.6°F | Ht 64.0 in | Wt 266.1 lb

## 2021-07-05 DIAGNOSIS — R0683 Snoring: Secondary | ICD-10-CM

## 2021-07-05 DIAGNOSIS — J181 Lobar pneumonia, unspecified organism: Secondary | ICD-10-CM | POA: Diagnosis not present

## 2021-07-05 DIAGNOSIS — Z720 Tobacco use: Secondary | ICD-10-CM

## 2021-07-05 NOTE — Progress Notes (Signed)
Subjective:    Patient ID: Teresa [name], female    DOB: 12/24/1985, 36 y.o.   MRN: [id number]  HPI   Chief Complaint  Patient presents with   Consult    Snoring, shallow breathing while sleeping. Was noted when patient was in [place]. With pneumonia   Uses 2LO2 cont. Prn during the day and at night time    36 year old smoker presents for evaluation of sleep disordered breathing She was admitted January from 06/17/2021 to 06/19/2021 with right lower lobe lobar  pneumonia.  She was treated with levofloxacin and discharged home with an additional week of levofloxacin.  She stated that about 2 days after she returned home she began having worsening symptoms  She was readmitted, found to have -night time oxygen desaturation down to 84%>> discharged home night time oxygen  She is compliant with nocturnal oxygen and this has helped increase her energy levels. History is corroborated by 2 daughters who accompany.  Loud snoring has been noted by family members including her husband was also witnessed apneas and gasping episodes. Epworth sleepiness score is 15 and she reports sleepiness while sitting and reading, watching TV, as a passenger in a car lying down to rest in the afternoons. Bedtime is between 10 and 11 PM, sleep latency about 30 minutes, she sleeps on her side with 2 pillows, reports 3-4 nocturnal awakenings including nocturia and is out of bed by 7 AM feeling tired with dryness of mouth but denies headaches. 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 She smoked half pack per day starting as a teenager until she quit January 2023, less than 10 pack years  I have reviewed hospital discharge summary and course last imaging studies   Past Medical History:  Diagnosis Date   Asthma    GERD (gastroesophageal reflux disease)     Hypertension    Hypothyroidism    PVC (premature ventricular contraction)    Tobacco abuse    Past Surgical History:  Procedure Laterality Date   ABDOMINAL HYSTERECTOMY  08/15/2011   Procedure: HYSTERECTOMY ABDOMINAL;  Surgeon: Florian Buff, MD;  Location: [facility];  Service: Gynecology;  Laterality: N/A;   BIOPSY  07/26/2011   [id number] esophageal erosions consistent with erosive reflux esophagitis.  Patulous EG junction, status post passage of a Maloney dilator (empirically), status post esophageal biopsy  followed dilation/ A 3-4 cm hiatal hernia, otherwise normal stomach, D1, D2.   CESAREAN SECTION  2007 and 2011   CHOLECYSTECTOMY  2007   ESOPHAGOGASTRODUODENOSCOPY  2008   incomplete due to inadequate conscious sedation, circumferential distal esophageal erosions, hiatal hernia   MALONEY DILATION  07/26/2011   Procedure: MALONEY DILATION;  Surgeon: Daneil Dolin, MD;  Location: [facility];  Service: Endoscopy;  Laterality: N/A;  15 French Dilator   SALPINGOOPHORECTOMY  08/15/2011   Procedure: SALPINGO OOPHERECTOMY;  Surgeon: Florian Buff, MD;  Location: [facility];  Service: Gynecology;  Laterality: Bilateral;   TUBAL LIGATION  2011    Allergies  Allergen Reactions   Betadine [Povidone Iodine] Hives   Ceclor [Cefaclor] Hives   Hydrocodone Itching   Morphine And Related     PVCs and "it stops my heart" per patient.   Adhesive [Tape] Rash   Latex Rash    Social History   Socioeconomic History   Marital  status: Married    Spouse name: Pavielle Wofford   Number of children: 3   Years of education: Not on file   Highest education level: Not on file  Occupational History    Employer: UNEMPLOYED  Tobacco Use   Smoking status: Former    Packs/day: 0.50    Years: 10.00    Pack years: 5.00    Types: Cigarettes    Start date: 07/06/2005   Smokeless tobacco: Current  Vaping Use   Vaping Use: Former   Substances: Flavoring  Substance and Sexual Activity   Alcohol use: No     Alcohol/week: 0.0 standard drinks   Drug use: No   Sexual activity: Yes    Partners: Male    Birth control/protection: None  Other Topics Concern   Not on file  Social History Narrative   Pt is doing online classes.   Social Determinants of Health   Financial Resource Strain: Not on file  Food Insecurity: Not on file  Transportation Needs: Not on file  Physical Activity: Not on file  Stress: Not on file  Social Connections: Not on file  Intimate Partner Violence: Not on file     Family History  Problem Relation Age of Onset   Ulcers Mother        Questionable history   Hypertension Mother    Heart failure Paternal Grandmother    Liver disease Neg Hx    Cancer Neg Hx        Colorectal cancer   Anesthesia problems Neg Hx    Hypotension Neg Hx    Malignant hyperthermia Neg Hx    Pseudochol deficiency Neg Hx      Review of Systems Shortness of breath with activity Irregular heartbeat Acid heartburn and indigestion Anxiety  Constitutional: negative for anorexia, fevers and sweats  Eyes: negative for irritation, redness and visual disturbance  Ears, nose, mouth, throat, and face: negative for earaches, epistaxis, nasal congestion and sore throat  Respiratory: negative for  sputum and wheezing  Cardiovascular: negative for chest pain,lower extremity edema, orthopnea, palpitations and syncope  Gastrointestinal: negative for abdominal pain, constipation, diarrhea, melena, nausea and vomiting  Genitourinary:negative for dysuria, frequency and hematuria  Hematologic/lymphatic: negative for bleeding, easy bruising and lymphadenopathy  Musculoskeletal:negative for arthralgias, muscle weakness and stiff joints  Neurological: negative for coordination problems, gait problems, headaches and weakness  Endocrine: negative for diabetic symptoms including polydipsia, polyuria and weight loss      Objective:   Physical Exam  Gen. Pleasant, obese, in no distress, normal  affect ENT - no pallor,icterus, no post nasal drip, class 2-3 airway Neck: No JVD, no thyromegaly, no carotid bruits Lungs: no use of accessory muscles, no dullness to percussion, decreased without rales or rhonchi  Cardiovascular: Rhythm regular, heart sounds  normal, no murmurs or gallops, no peripheral edema Abdomen: soft and non-tender, no hepatosplenomegaly, BS normal. Musculoskeletal: No deformities, no cyanosis or clubbing Neuro:  alert, non focal, no tremors       Assessment & Plan:   OSA -  Given excessive daytime somnolence, narrow pharyngeal exam, witnessed apneas & loud snoring, obstructive sleep apnea is very likely & an overnight polysomnogram will be scheduled as a split study. The pathophysiology of obstructive sleep apnea , it's cardiovascular consequences & modes of treatment including CPAP were discused with the patient in detail & they evidenced understanding. This will also help [place] to decide whether we need to continue on nocturnal oxygen Pretest probability is high, she would not  tolerate dental appliance and would be willing to use a CPAP if needed

## 2021-07-05 NOTE — Assessment & Plan Note (Signed)
Follow-up chest x-ray will be obtained for resolution of pneumonia

## 2021-07-05 NOTE — Assessment & Plan Note (Signed)
Smoking cessation was emphasized is the most important intervention 

## 2021-07-05 NOTE — Patient Instructions (Signed)
°  X split study  X CXR to follow up pneumonia

## 2021-08-04 ENCOUNTER — Institutional Professional Consult (permissible substitution): Payer: Medicaid Other | Admitting: Pulmonary Disease

## 2021-10-31 ENCOUNTER — Telehealth: Payer: Self-pay | Admitting: Pulmonary Disease

## 2021-10-31 ENCOUNTER — Telehealth: Payer: Self-pay

## 2021-10-31 DIAGNOSIS — J9601 Acute respiratory failure with hypoxia: Secondary | ICD-10-CM

## 2021-10-31 DIAGNOSIS — R0683 Snoring: Secondary | ICD-10-CM

## 2021-10-31 NOTE — Telephone Encounter (Signed)
Called and notified patient and she voiced understanding. She is aware [employer] should be reaching out to get O2 humidity situated

## 2021-10-31 NOTE — Telephone Encounter (Signed)
Victorino Dike from [employer] called and states patient  had a sleep study ordered but has not completed yet and states that patient told Victorino Dike that she still has concentrator and portable O2 she uses at night and sometimes during the day.  I informed Victorino Dike that during patients last OV, it looks like I missed the order for the split night and it was not placed. Was not aware of this until today when patient called and I have asked PCCs about getting Sleep study ordered and we are just waiting on Prior Auth before it gets scheduled.  She states she will let patient know we are working on getting it scheduled and we will reach out to her to give her an update.

## 2021-10-31 NOTE — Telephone Encounter (Signed)
Called and notified patient of delay in sleep study. Offered apology for delay in order being placed for SS and she voiced understanding and was made aware that study may not get done until July. Also let her know that we have sent a staff message to [employer] to see why they have been calling her to see if we can have it straightened out on our end.   Patient states that her O2 has been drying her nose out and causing nose bleeds and wants to know if anything can be given to her to help with that. Dr. Elsworth Soho please advise

## 2021-10-31 NOTE — Telephone Encounter (Signed)
Looks like order for split night was never placed- my mistake.  PCCs please advise not sure if its possible but patient wants to know if she can be scheduled for split night study at [facility] this week?  I can call patient to notify if not able to do it this week since I missed putting the order in. Thank you!

## 2021-12-12 ENCOUNTER — Encounter: Payer: Medicaid Other | Admitting: Pulmonary Disease

## 2022-03-19 IMAGING — CT CT ANGIO CHEST
2 of 7 series · 18 of 46 positions shown · IV contrast (Omnipaque or Isovue)
Comparison: None.

CLINICAL DATA: PE suspected, cough, body aches, wheezing

EXAM:
CT ANGIOGRAPHY CHEST WITH CONTRAST
TECHNIQUE: Multidetector CT imaging of the chest was performed using the
standard protocol during bolus administration of intravenous
contrast. Multiplanar CT image reconstructions and MIPs were
obtained to evaluate the vascular anatomy.

[Series 6: pe axial thins · axial · 0.72mm/px · z∈[+1076,+1331]mm · 15 of 357 slices shown]
[im 19/357  lung]
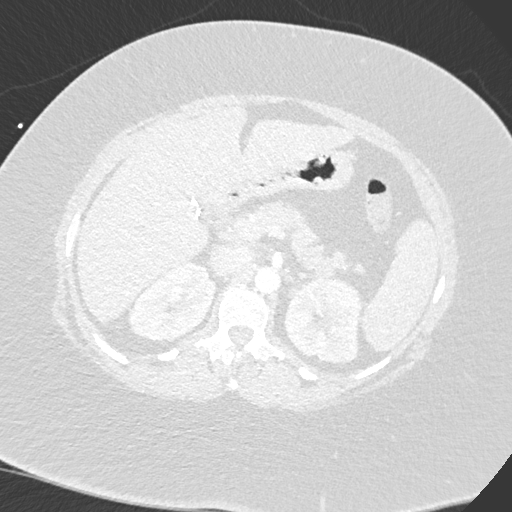
[im 38/357  soft-tissue]
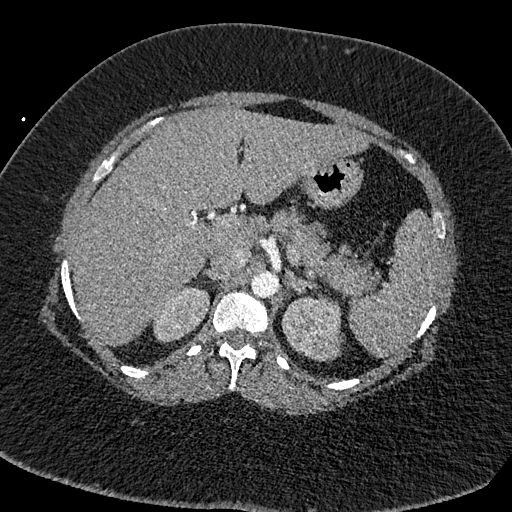
[im 75/357  lung]
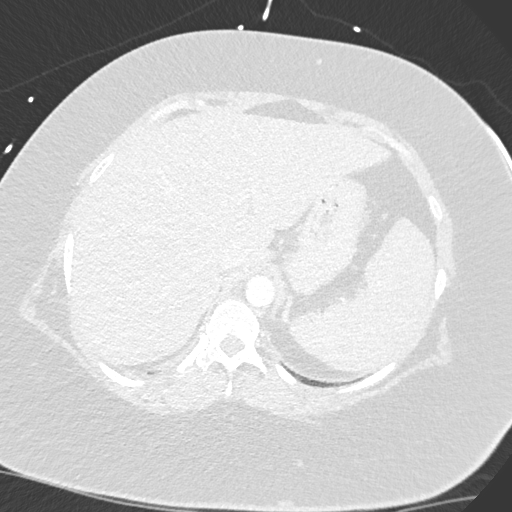
[im 94/357  soft-tissue]
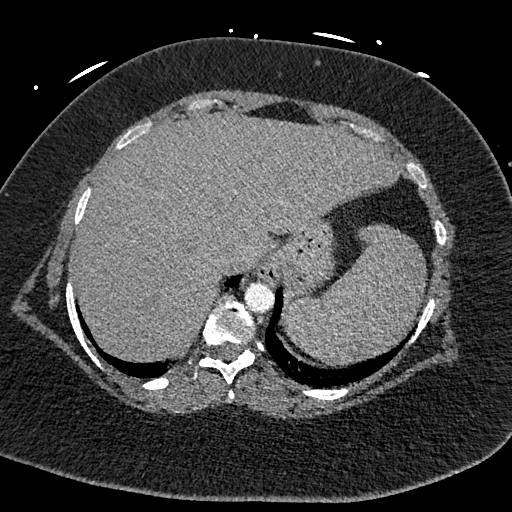
[im 113/357  lung]
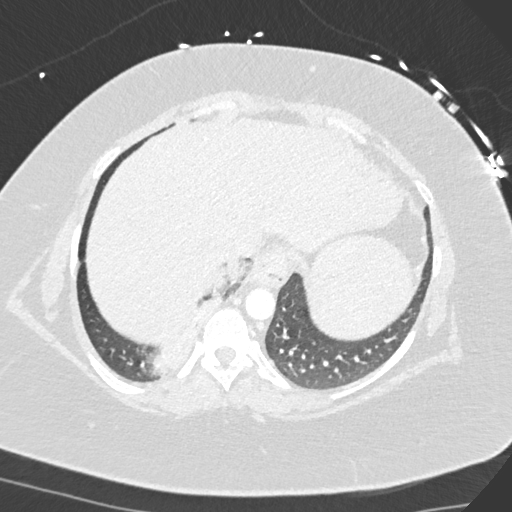
[im 132/357  soft-tissue]
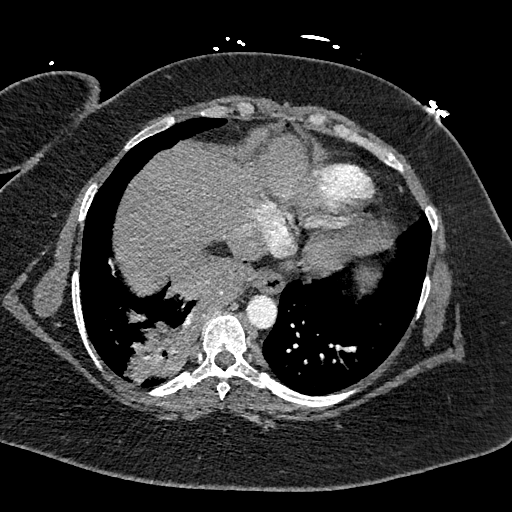
[im 150/357  lung]
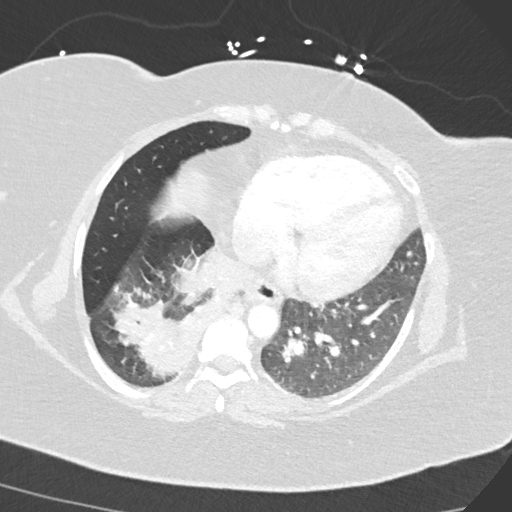
[im 188/357  soft-tissue]
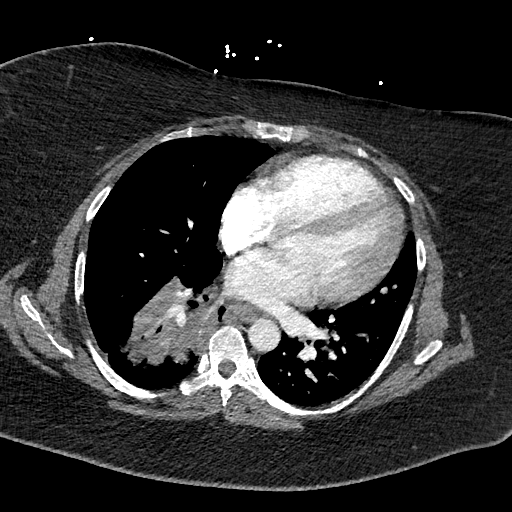
[im 207/357  lung]
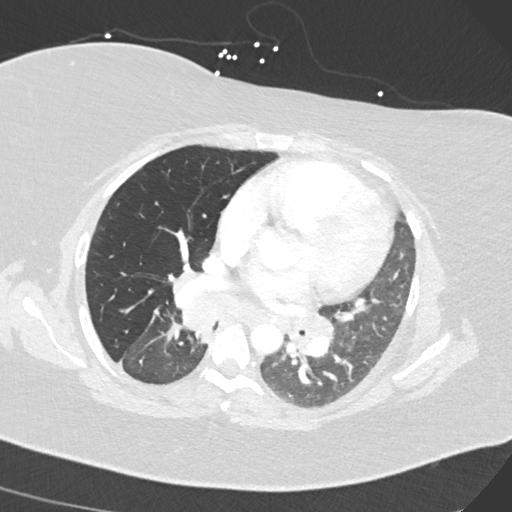
[im 225/357  soft-tissue]
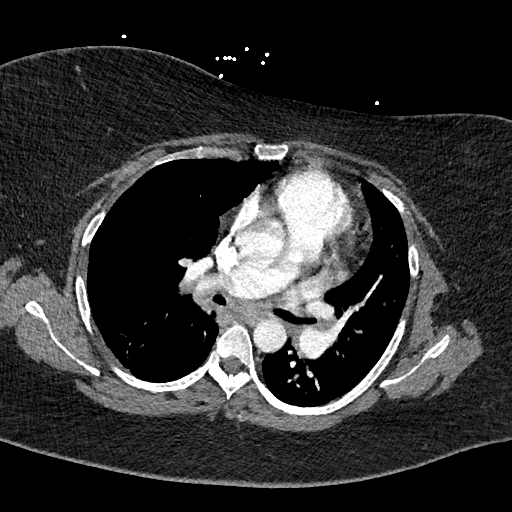
[im 244/357  lung]
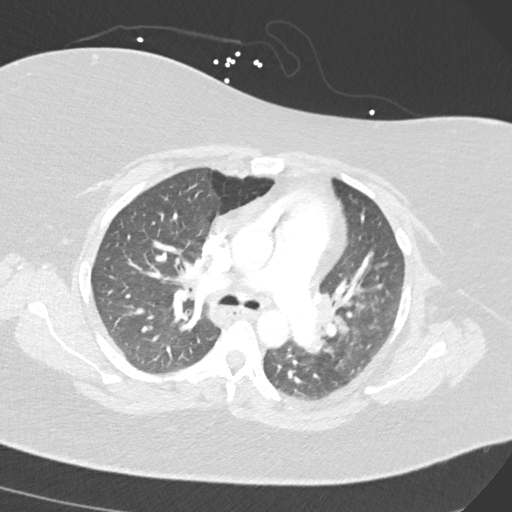
[im 263/357  soft-tissue]
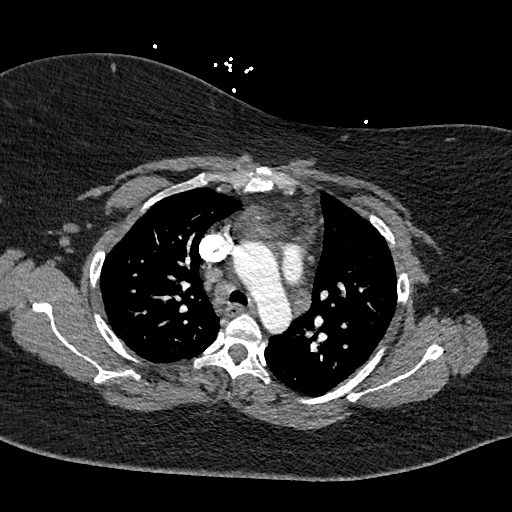
[im 300/357  lung]
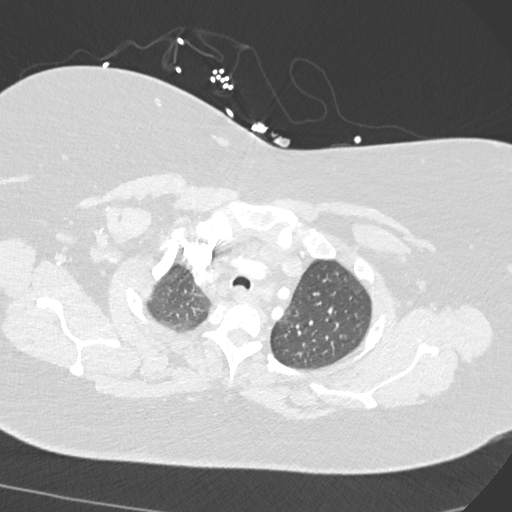
[im 319/357  soft-tissue]
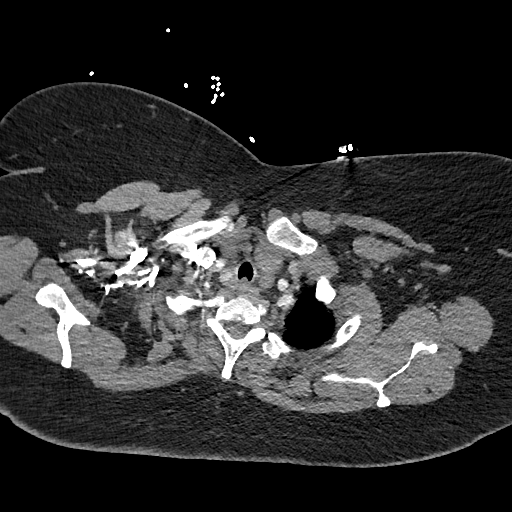
[im 338/357  lung]
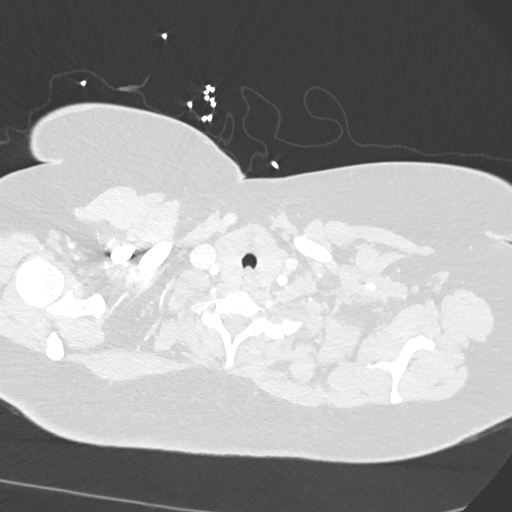

[Series 9: cor soft · coronal · 0.63mm/px · 3 of 184 slices shown]
[im 46/184  soft-tissue]
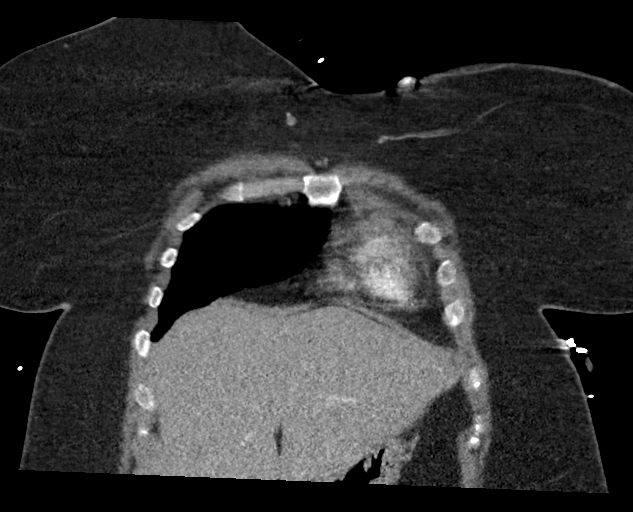
[im 92/184  soft-tissue]
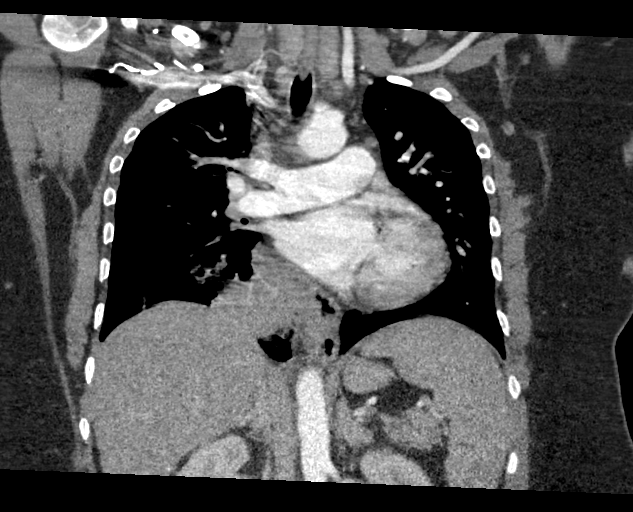
[im 138/184  soft-tissue]
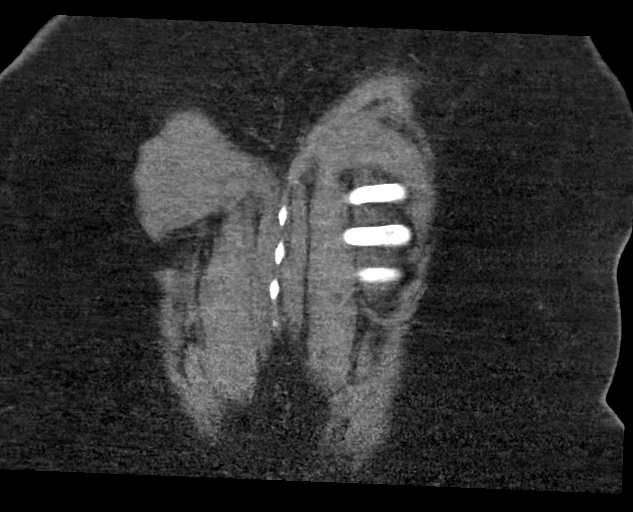

[18 of 46 positions shown; findings below may reference images not displayed]

RADIATION DOSE REDUCTION: This exam was performed according to the
departmental dose-optimization program which includes automated
exposure control, adjustment of the mA and/or kV according to
patient size and/or use of iterative reconstruction technique.

CONTRAST:  100mL OMNIPAQUE IOHEXOL 350 MG/ML SOLN
FINDINGS: Cardiovascular: Satisfactory opacification of the pulmonary arteries
to the segmental level. No evidence of pulmonary embolism. Normal
heart size. No pericardial effusion.

Mediastinum/Nodes: Numerous prominent mediastinal and hilar lymph
nodes. Small hiatal hernia. Thyroid gland, trachea, and esophagus
demonstrate no significant findings.

Lungs/Pleura: Extensive, somewhat masslike heterogeneous airspace
opacity and consolidation in the right lower lobe, largest component
measuring approximately 6.8 x 6.7 cm (series 8, image 75). No
pleural effusion or pneumothorax.

Upper Abdomen: No acute abnormality.  Status post cholecystectomy.

Musculoskeletal: No chest wall abnormality. No acute osseous
findings.

Review of the MIP images confirms the above findings.
IMPRESSION: 1. Negative examination for pulmonary embolism.
2. Extensive, somewhat masslike heterogeneous airspace opacity and
consolidation in the right lower lobe, largest component measuring
approximately 6.8 x 6.7 cm. Findings are most consistent with lobar
pneumonia, however recommend radiographic follow-up in 6-8 weeks at
the resolution of symptoms to ensure complete resolution and exclude
underlying mass.
3. Numerous prominent mediastinal and hilar lymph nodes, likely
reactive.
4. Small hiatal hernia.

## 2022-03-21 IMAGING — DX DG CHEST 1V PORT
1 series · 1 of 1 positions shown · non-contrast
Comparison: Chest x-ray 06/17/2021.

CLINICAL DATA: 35-year-old female with history of pneumonia. Former
smoker.

EXAM:
PORTABLE CHEST 1 VIEW

[chest ap grid]
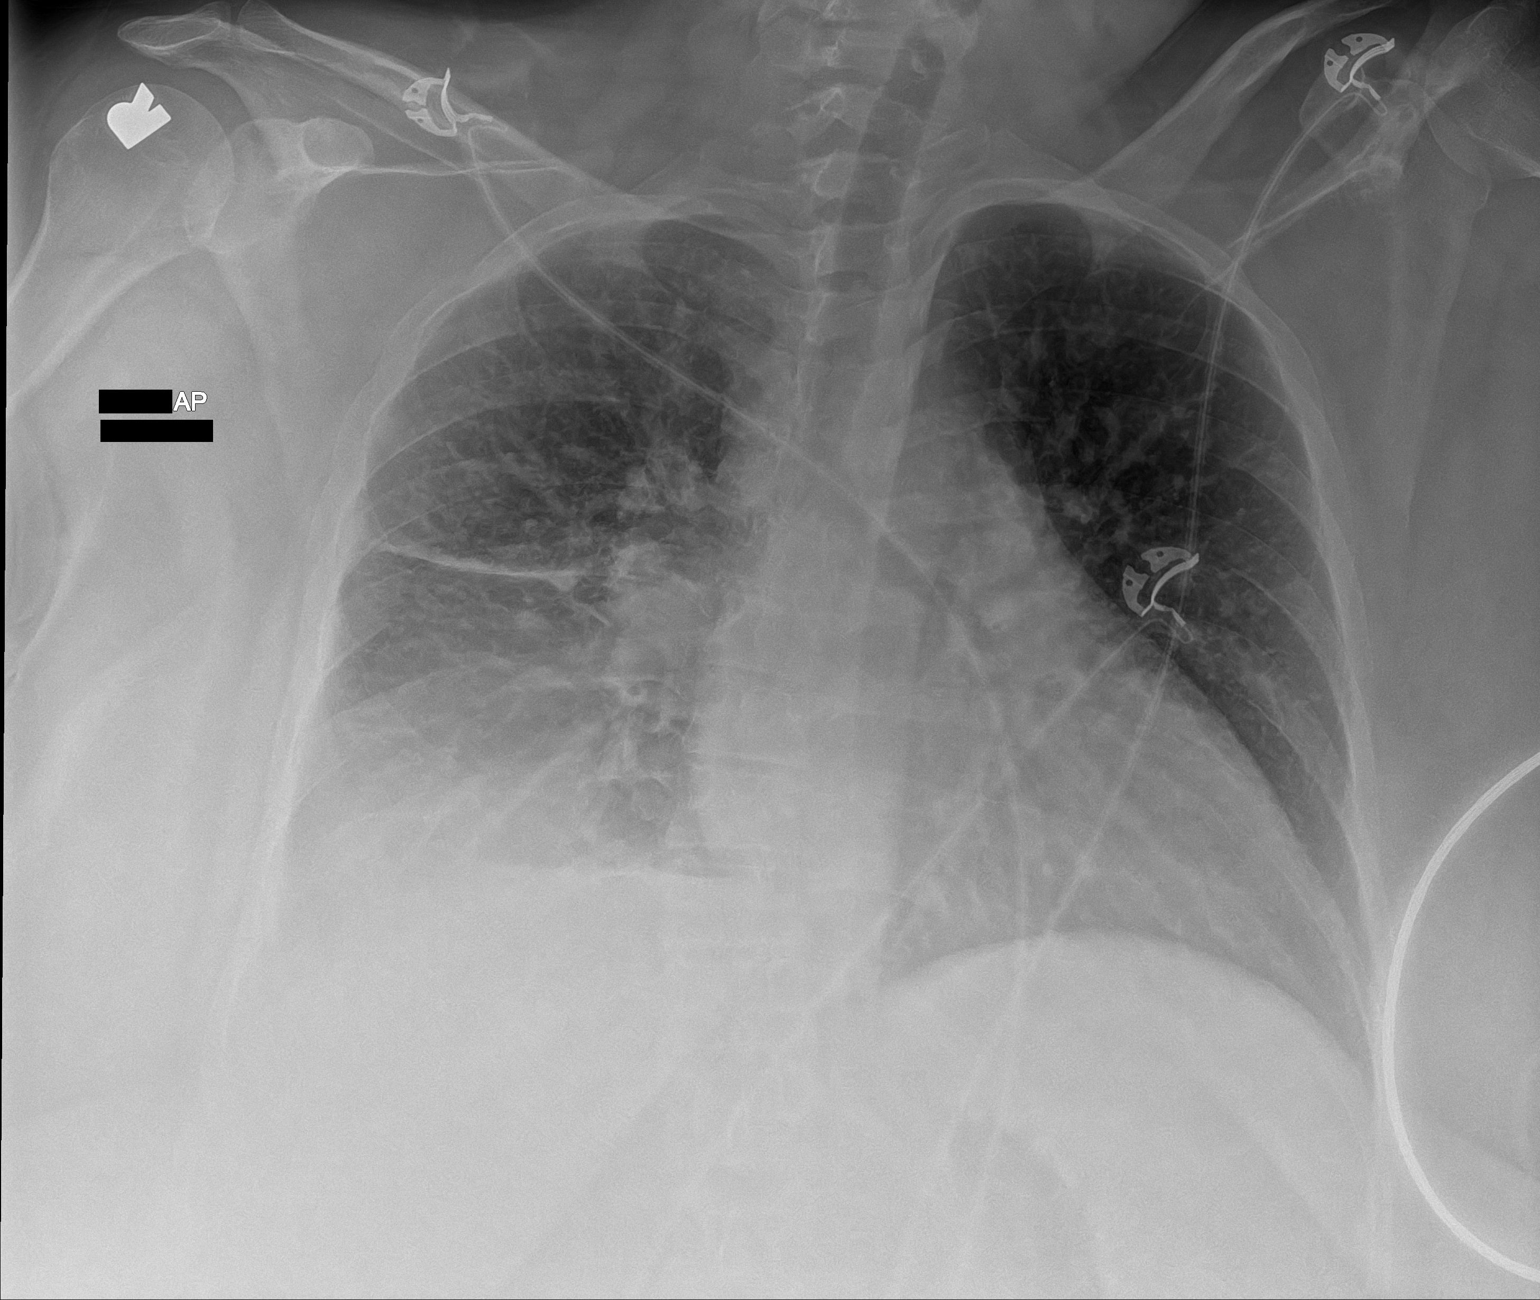

[1 of 1 positions shown; findings below may reference images not displayed]

FINDINGS: Lung volumes are slightly low. Worsening airspace consolidation
throughout the right mid to lower lung. Small right pleural
effusion. Left lung is clear. No evidence of pulmonary edema. Heart
size is normal. The patient is rotated to the left on today's exam,
resulting in distortion of the mediastinal contours and reduced
diagnostic sensitivity and specificity for mediastinal pathology.
IMPRESSION: 1. Worsening right-sided multilobar pneumonia involving the right
middle and lower lobes with small right parapneumonic pleural
effusion. Followup PA and lateral chest X-ray is recommended in 3-4
weeks following trial of antibiotic therapy to ensure resolution and
exclude underlying malignancy.

## 2022-03-24 IMAGING — DX DG CHEST 1V PORT
1 series · 1 of 1 positions shown · non-contrast
Comparison: Radiograph 3 days ago, on chest CT 5 days ago

CLINICAL DATA: Chest pain and shortness of breath. Recent pneumonia
diagnosis.

EXAM:
PORTABLE CHEST 1 VIEW

[chest ap]
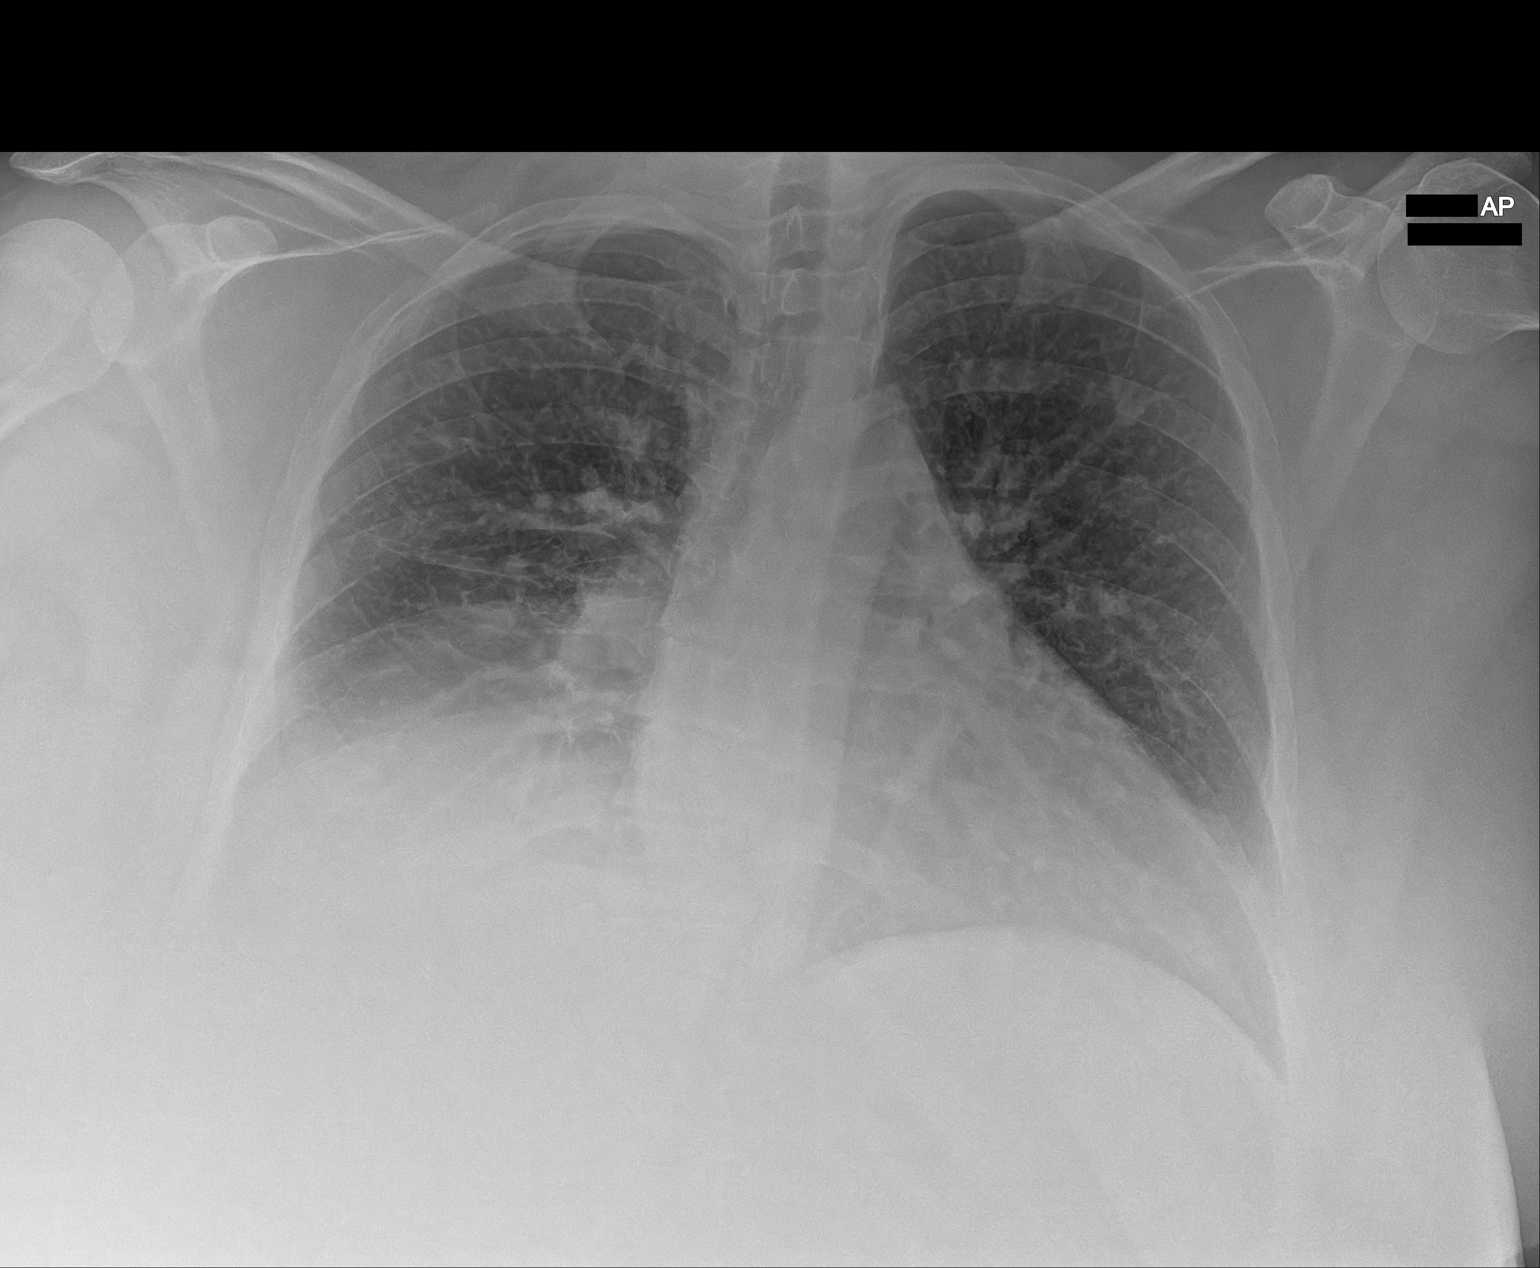

[1 of 1 positions shown; findings below may reference images not displayed]

FINDINGS: Persistent confluent right lung base opacity, unchanged or possibly
worsened in the interim. Decreased linear opacity in the right mid
lung. No new airspace disease. No pneumothorax. Stable osseous
structures.
IMPRESSION: Persistent confluent right lung base opacity, unchanged or possibly
worsened in the interim. Decreased linear opacity in the right mid
lung.

## 2022-07-05 ENCOUNTER — Encounter: Payer: Self-pay | Admitting: Pulmonary Disease

## 2022-09-20 ENCOUNTER — Ambulatory Visit
Admission: EM | Admit: 2022-09-20 | Discharge: 2022-09-20 | Disposition: A | Discharge status: Home / Self Care | Payer: Medicaid Other | Attending: Family Medicine | Admitting: Family Medicine

## 2022-09-20 DIAGNOSIS — S80852A Superficial foreign body, left lower leg, initial encounter: Secondary | ICD-10-CM | POA: Diagnosis not present

## 2022-09-20 DIAGNOSIS — M79605 Pain in left leg: Secondary | ICD-10-CM | POA: Diagnosis not present

## 2022-09-20 MED ORDER — DOXYCYCLINE HYCLATE 100 MG PO CAPS: 100.0000 mg | ORAL_CAPSULE | Freq: Two times a day (BID) | ORAL | 0 refills | Status: AC

## 2022-09-20 NOTE — ED Triage Notes (Addendum)
Pt reports she fell through her wooden porch and now has a splinter in her left leg and her under her left arm and left pinky is painful today.    Pt reports she stopped taking her bp meds

## 2022-09-20 NOTE — Discharge Instructions (Signed)
If not allergic, you may use over the counter ibuprofen or acetaminophen as needed. ° °

## 2022-09-20 NOTE — ED Provider Notes (Signed)
[facility]   [id number] 09/20/22 Arrival Time: 1600  ASSESSMENT & PLAN:  1. Left leg pain   2. Splinter of lower leg without major open wound, left, initial encounter    Watch for s/s of infection. See AVS for aftercare instructions.  Did hit L hand on boards. Initial described pain. Declines imaging of LUE. "It feels fine now".  Foreign Body Removal  Date/Time: 09/20/2022 5:28 PM  Performed by: Mardella Layman, MD Authorized by: Mardella Layman, MD   Consent:    Consent obtained:  Verbal   Consent given by:  Patient   Risks, benefits, and alternatives were discussed: yes     Risks discussed:  Bleeding, infection and incomplete removal Universal protocol:    Procedure explained and questions answered to patient or proxy's satisfaction: yes     Patient identity confirmed:  Verbally with patient Location:    Location:  Leg   Leg location:  L lower leg   Depth:  Intradermal Pre-procedure details:    Imaging:  None   Neurovascular status: intact     Preparation: Patient was prepped and draped in usual sterile fashion   Anesthesia:    Anesthesia method:  Local infiltration   Local anesthetic:  Lidocaine 1% WITH epi Procedure type:    Procedure complexity:  Simple Procedure details:    Incision length:  1 cm   Localization method:  Finder needle and visualized   Bloodless field: yes     Removal mechanism:  Forceps   Foreign bodies recovered:  1   Description:  Wooden fragment measuring approx 0.25 cm x 0.5 cm   Intact foreign body: appeared so.   Post-procedure details:    Neurovascular status: intact     Confirmation:  No additional foreign bodies on visualization   Skin closure:  Sutures   Number of sutures:  3   Suture size:  3-0   Wound skin closure material used: silk.   Suture technique:  Simple interrupted   Dressing:  Non-adherent dressing   Procedure completion:  Tolerated well, no immediate complications   Begin: Meds ordered this encounter   Medications   doxycycline (VIBRAMYCIN) 100 MG capsule    Sig: Take 1 capsule (100 mg total) by mouth 2 (two) times daily.    Dispense:  14 capsule    Refill:  0     Discharge Instructions      If not allergic, you may use over the counter ibuprofen or acetaminophen as needed.      Follow-up Information     [facility].   Specialty: Urgent Care Why: In 7 days for suture removal, sooner if needed. Contact information: [address] [place] [address] [phone]               Reviewed expectations re: course of current medical issues. Questions answered. Outlined signs and symptoms indicating need for more acute intervention. Patient verbalized understanding. After Visit Summary given.   SUBJECTIVE:  Teresa [name] is a 37 y.o. female who reports falling through wooden porch; today; pain in L lower leg. Splinter in L leg. No bleeding.  Td UTD: Yes.  ROS: As per HPI. Health Maintenance Due  Topic Date Due   COVID-19 Vaccine (1) Never done   Hepatitis C Screening  Never done   PAP SMEAR-Modifier  Never done    OBJECTIVE:  Vitals:   09/20/22 1620 09/20/22 1631  BP: (!) 132/103 129/89  Pulse: 94  Resp: 20   Temp: 98.1 F (36.7 C)   TempSrc: Oral   SpO2: 95%      General appearance: alert; no distress LLE: tip of wooden splinter visible in LLE just below knee; no bleeding; is TTP Psychological: alert and cooperative; normal mood and affect   Allergies  Allergen Reactions   Betadine [Povidone Iodine] Hives   Ceclor [Cefaclor] Hives   Hydrocodone Itching   Morphine And Related     PVCs and "it stops my heart" per patient.   Adhesive [Tape] Rash   Latex Rash    Past Medical History:  Diagnosis Date   Asthma    GERD (gastroesophageal reflux disease)    Hypertension    Hypothyroidism    PVC (premature ventricular contraction)    Tobacco abuse    Social History    Socioeconomic History   Marital status: Married    Spouse name: Dollye Glasser   Number of children: 3   Years of education: Not on file   Highest education level: Not on file  Occupational History    Employer: UNEMPLOYED  Tobacco Use   Smoking status: Former    Packs/day: 0.50    Years: 10.00    Additional pack years: 0.00    Total pack years: 5.00    Types: Cigarettes    Start date: 07/06/2005   Smokeless tobacco: Current  Vaping Use   Vaping Use: Former   Substances: Flavoring  Substance and Sexual Activity   Alcohol use: No    Alcohol/week: 0.0 standard drinks of alcohol   Drug use: No   Sexual activity: Yes    Partners: Male    Birth control/protection: None  Other Topics Concern   Not on file  Social History Narrative   Pt is doing online classes.   Social Determinants of Health   Financial Resource Strain: Not on file  Food Insecurity: Not on file  Transportation Needs: Not on file  Physical Activity: Not on file  Stress: Not on file  Social Connections: Not on file          Mardella Layman, MD 09/20/22 1731

## 2022-09-25 ENCOUNTER — Ambulatory Visit (INDEPENDENT_AMBULATORY_CARE_PROVIDER_SITE_OTHER): Payer: Medicaid Other | Admitting: Pulmonary Disease

## 2022-09-25 ENCOUNTER — Encounter: Payer: Self-pay | Admitting: Pulmonary Disease

## 2022-09-25 VITALS — BP 154/104 | HR 92 | Ht 63.0 in | Wt 297.0 lb

## 2022-09-25 DIAGNOSIS — R0683 Snoring: Secondary | ICD-10-CM

## 2022-09-25 NOTE — Progress Notes (Signed)
[facility]  Chief Complaint  Patient presents with   Follow-up    Snoring     Past Surgical History:  She  has a past surgical history that includes Cholecystectomy (2007); Cesarean section (2007 and 2011); Esophagogastroduodenoscopy (2008); Tubal ligation (2011); maloney dilation (07/26/2011); biopsy (07/26/2011); Abdominal hysterectomy (08/15/2011); and Salpingoophorectomy (08/15/2011).  Past Medical History:  GERD, HTN, Hypothyroidism  Constitutional:  BP (!) 154/104   Pulse 92   Ht 5\' 3"  (1.6 m)   Wt 297 lb (134.7 kg)   LMP 05/28/2009   SpO2 94% Comment: room air  BMI 52.61 kg/m   Brief Summary:  Teresa [name] is a 37 y.o. female smoker with snoring.      Subjective:   She is here with her mother.  She was in hospital last year for pneumonia and sent home with supplemental oxygen.  She was seen by Dr. Vassie Loll for assessment of sleep apnea and in-lab sleep study ordered.  She was not able to arrange for childcare so she could do in-lab study.  She is not longer using oxygen at night.  She snores and wakes up feeling like her breathing stopped.  She feels panicked when this happens.  She feels tired during the day and falls asleep when sitting quiet.  She frequently gets leg cramps.  She goes to sleep between 9 and 1130 pm.  She falls asleep after 15 to 30 minutes.  She wakes up some times to use the bathroom.  She gets out of bed at 830 am.  She feels tired in the morning.  She denies morning headache.  She does not use anything to help her stay awake.  She uses xanax to help sleep, and has used this for several years.  She denies sleep walking, sleep talking, bruxism, or nightmares.  There is no history of restless legs.  She denies sleep hallucinations, sleep paralysis, or cataplexy.  The Epworth score is 20 out of 24.   Physical Exam:   Appearance - well kempt   ENMT - no sinus tenderness, no oral exudate, no LAN,  Mallampati 3 airway, no stridor  Respiratory - equal breath sounds bilaterally, no wheezing or rales  CV - s1s2 regular rate and rhythm, no murmurs  Ext - no clubbing, no edema  Skin - no rashes  Psych - normal mood and affect   Sleep Tests:    Social History:  She  reports that she has quit smoking. Her smoking use included cigarettes. She started smoking about 17 years ago. She has a 5.00 pack-year smoking history. She uses smokeless tobacco. She reports that she does not drink alcohol and does not use drugs.  Family History:  Her family history includes Heart failure in her paternal grandmother; Hypertension in her mother; Ulcers in her mother.    Discussion:  She has snoring, sleep disruption, apnea, and daytime sleepiness.  She has a history of hypertension.  Her BMI is > 35.  I am concerned she could have obstructive sleep apnea.  Assessment/Plan:   Snoring with excessive daytime sleepiness. - will need to arrange for a home sleep study  Obesity. - discussed how weight can impact sleep and risk for sleep disordered breathing - discussed options to assist with weight loss: combination of diet modification, cardiovascular and strength training exercises  Cardiovascular risk. - had an extensive discussion regarding the adverse health consequences related to untreated sleep disordered breathing - specifically discussed the risks for  hypertension, coronary artery disease, cardiac dysrhythmias, cerebrovascular disease, and diabetes - lifestyle modification discussed  Safe driving practices. - discussed how sleep disruption can increase risk of accidents, particularly when driving - safe driving practices were discussed  Therapies for obstructive sleep apnea. - if the sleep study shows significant sleep apnea, then various therapies for treatment were reviewed: CPAP, oral appliance, and surgical interventions  Time Spent Involved in Patient Care on Day of Examination:   37 minutes  Follow up:   Patient Instructions  Will arrange for a home sleep study Will call to arrange for follow up after sleep study reviewed  Medication List:   Allergies as of 09/25/2022       Reactions   Betadine [povidone Iodine] Hives   Ceclor [cefaclor] Hives   Hydrocodone Itching   Morphine And Related    PVCs and "it stops my heart" per patient.   Adhesive [tape] Rash   Latex Rash        Medication List        Accurate as of September 25, 2022  3:34 PM. If you have any questions, ask your nurse or doctor.          albuterol (2.5 MG/3ML) 0.083% nebulizer solution Commonly known as: PROVENTIL Take 3 mLs (2.5 mg total) by nebulization every 4 (four) hours as needed for wheezing or shortness of breath.   ALPRAZolam 1 MG tablet Commonly known as: XANAX Take 1 mg by mouth daily as needed for anxiety.   doxycycline 100 MG capsule Commonly known as: VIBRAMYCIN Take 1 capsule (100 mg total) by mouth 2 (two) times daily.   ibuprofen 200 MG tablet Commonly known as: ADVIL Take 800 mg by mouth every 6 (six) hours as needed.   levothyroxine 175 MCG tablet Commonly known as: SYNTHROID Take 175 mcg by mouth daily.   montelukast 10 MG tablet Commonly known as: SINGULAIR Take 10 mg by mouth daily.   omeprazole 40 MG capsule Commonly known as: PRILOSEC TAKE 1 CAPSULE(40 MG) BY MOUTH DAILY What changed: See the new instructions.   tiZANidine 4 MG tablet Commonly known as: ZANAFLEX Take 1 tablet (4 mg total) by mouth every 8 (eight) hours as needed for muscle spasms.        Signature:  Coralyn Helling, MD [facility] Pager - [phone] 09/25/2022, 3:34 PM

## 2022-09-25 NOTE — Patient Instructions (Signed)
Will arrange for a home sleep study Will call to arrange for follow up after sleep study reviewed  

## 2022-09-26 ENCOUNTER — Ambulatory Visit: Payer: Medicaid Other

## 2022-10-16 ENCOUNTER — Encounter: Payer: Self-pay | Admitting: *Deleted

## 2022-10-16 ENCOUNTER — Ambulatory Visit: Payer: Medicaid Other

## 2022-10-16 DIAGNOSIS — G4733 Obstructive sleep apnea (adult) (pediatric): Secondary | ICD-10-CM

## 2022-10-16 DIAGNOSIS — R0683 Snoring: Secondary | ICD-10-CM

## 2022-11-12 ENCOUNTER — Emergency Department (HOSPITAL_COMMUNITY)
Admission: EM | Admit: 2022-11-12 | Discharge: 2022-11-12 | Discharge status: Against Medical Advice | Payer: Medicaid Other | Attending: Emergency Medicine | Admitting: Emergency Medicine

## 2022-11-12 ENCOUNTER — Other Ambulatory Visit: Payer: Self-pay

## 2022-11-12 ENCOUNTER — Encounter (HOSPITAL_COMMUNITY): Payer: Self-pay | Admitting: Emergency Medicine

## 2022-11-12 ENCOUNTER — Emergency Department (HOSPITAL_COMMUNITY): Payer: Medicaid Other

## 2022-11-12 DIAGNOSIS — Z5321 Procedure and treatment not carried out due to patient leaving prior to being seen by health care provider: Secondary | ICD-10-CM | POA: Diagnosis not present

## 2022-11-12 DIAGNOSIS — R0789 Other chest pain: Secondary | ICD-10-CM | POA: Diagnosis present

## 2022-11-12 DIAGNOSIS — R42 Dizziness and giddiness: Secondary | ICD-10-CM | POA: Insufficient documentation

## 2022-11-12 DIAGNOSIS — I10 Essential (primary) hypertension: Secondary | ICD-10-CM | POA: Insufficient documentation

## 2022-11-12 NOTE — ED Triage Notes (Addendum)
Pt c/o left-sided stabbing chest pain radiating to left rib cage and left arm with dizziness, started yesterday. Pain is rated 8/10, worse with deep inhalation. PMH includes HTN.

## 2022-11-12 NOTE — ED Notes (Signed)
Pt called x 3 in last 20 min with no answer

## 2022-11-14 ENCOUNTER — Encounter: Payer: Self-pay | Admitting: Pulmonary Disease

## 2022-11-14 ENCOUNTER — Telehealth: Payer: Self-pay | Admitting: Pulmonary Disease

## 2022-11-14 NOTE — Telephone Encounter (Signed)
I guess my previous message on this did not go all the way through. I checked it again and resent a message to Dr Craige Cotta to read results.

## 2022-11-14 NOTE — Telephone Encounter (Signed)
Spoke with patient. Advised we haven't received sleep study results from [facility] yet. But once we have those we would give a call back to discuss results. Patient verbalized understanding. NFN

## 2022-11-14 NOTE — Telephone Encounter (Signed)
I have not seen this yet.  Please check with [facility] about status of study.

## 2022-11-14 NOTE — Telephone Encounter (Signed)
There is phone note from today regarding this also.  I have not seen her sleep study yet.  It can take [facility] 2 to 3 weeks to receive there test equipment and then process it before it is sent over to the physician for review.

## 2022-11-14 NOTE — Telephone Encounter (Signed)
Dr Craige Cotta, please advise on HST results, thank you!

## 2022-11-14 NOTE — Telephone Encounter (Signed)
I am routing to [facility] to check for study.

## 2022-11-14 NOTE — Telephone Encounter (Signed)
Pt. Calling wanting to know if her Sleep study results have come back

## 2022-11-15 ENCOUNTER — Telehealth: Payer: Self-pay | Admitting: Pulmonary Disease

## 2022-11-15 DIAGNOSIS — G4733 Obstructive sleep apnea (adult) (pediatric): Secondary | ICD-10-CM

## 2022-11-15 NOTE — Telephone Encounter (Signed)
HST 10/16/22 >> AHI 37.4, SpO2 low 56%.  Spent 443.8 min of test time with an SpO2 < 89%.   Please inform her that her sleep study shows severe obstructive sleep apnea and low oxygen at night.  If she is agreeable please arrange for a CPAP titration study at [facility] to start her on therapy.  If she would like to discuss tests results in more detail before setting up therapy, then please schedule an ROV with me or NP to discuss treatment options.

## 2022-11-26 NOTE — Telephone Encounter (Signed)
Sent pt a mychart message w/ results and ordered CPAP titration per VS.

## 2023-01-29 ENCOUNTER — Telehealth: Payer: Self-pay | Admitting: *Deleted

## 2023-01-29 NOTE — Telephone Encounter (Signed)
Spoke with patient and reviewed sleep study results and recommendations. She is agreeable to titration study. Please contact patient to schedule.

## 2023-01-29 NOTE — Telephone Encounter (Signed)
Patient called and states she never heard back from her sleep study that was done on 10/16/22.   There was documentation regarding ordering a CPAP titration study on 11/26/22 that was pending authorization, but was not scheduled.   Please call and advise patient. [phone]

## 2023-02-01 NOTE — Telephone Encounter (Signed)
Noted  

## 2023-04-16 LAB — LIPID PANEL
LDL Cholesterol: 118
Triglycerides: 98 (ref 40–160)

## 2023-04-16 LAB — COMPREHENSIVE METABOLIC PANEL: eGFR: 117

## 2023-04-16 LAB — BASIC METABOLIC PANEL
BUN: 13 (ref 4–21)
Creatinine: 0.7 (ref 0.5–1.1)
Glucose: 96

## 2023-04-16 LAB — TSH: TSH: 0.05 — AB (ref 0.41–5.90)

## 2023-04-16 NOTE — Telephone Encounter (Signed)
[facility] - please advise on CPAP Titration order placed 11/26/22. Thanks!

## 2023-06-07 LAB — TSH: TSH: 0.88 (ref 0.41–5.90)

## 2023-06-27 LAB — TSH: TSH: 4.33 (ref 0.41–5.90)

## 2023-07-19 LAB — TSH: TSH: 3.4 (ref 0.41–5.90)

## 2023-08-05 ENCOUNTER — Ambulatory Visit: Payer: Self-pay | Admitting: Nurse Practitioner

## 2023-08-05 DIAGNOSIS — E039 Hypothyroidism, unspecified: Secondary | ICD-10-CM

## 2023-08-05 DIAGNOSIS — R7989 Other specified abnormal findings of blood chemistry: Secondary | ICD-10-CM

## 2023-08-05 NOTE — Progress Notes (Unsigned)
 Erroneous encounter

## 2023-09-09 ENCOUNTER — Encounter: Payer: Medicaid Other | Admitting: Internal Medicine

## 2023-09-09 NOTE — Progress Notes (Deleted)
 Office Visit Note  Patient: Teresa [name]             Date of Birth: Mar 11, 1986           MRN: [id number]             PCP: Kristina Pfeiffer, PA-C Referring: Ardyce Krone Visit Date: 09/09/2023 Occupation: @GUAROCC @  Subjective:  No chief complaint on file.   History of Present Illness: Teresa [name] is a 38 y.o. female ***     Activities of Daily Living:  Patient reports morning stiffness for *** {minute/hour:19697}.   Patient {ACTIONS;DENIES/REPORTS:21021675::"Denies"} nocturnal pain.  Difficulty dressing/grooming: {ACTIONS;DENIES/REPORTS:21021675::"Denies"} Difficulty climbing stairs: {ACTIONS;DENIES/REPORTS:21021675::"Denies"} Difficulty getting out of chair: {ACTIONS;DENIES/REPORTS:21021675::"Denies"} Difficulty using hands for taps, buttons, cutlery, and/or writing: {ACTIONS;DENIES/REPORTS:21021675::"Denies"}  No Rheumatology ROS completed.   PMFS History:  Patient Active Problem List   Diagnosis Date Noted   Asthma, chronic, unspecified asthma severity, with acute exacerbation 06/23/2021   Lobar pneumonia (HCC) 06/23/2021   Acute respiratory failure with hypoxia (HCC) 06/22/2021   Obesity, Class III, BMI 40-49.9 (morbid obesity) (HCC) 06/18/2021   Pneumonia 06/17/2021   Esophageal dysphagia 07/04/2011   Rectal bleed 07/04/2011   Syncopal episodes 11/24/2010   Tobacco abuse    PVC (premature ventricular contraction)    GERD, SEVERE 02/08/2009   EPIGASTRIC PAIN 02/08/2009    Past Medical History:  Diagnosis Date   Asthma    GERD (gastroesophageal reflux disease)    Hypertension    Hypothyroidism    PVC (premature ventricular contraction)    Tobacco abuse     Family History  Problem Relation Age of Onset   Ulcers Mother        Questionable history   Hypertension Mother    Heart failure Paternal Grandmother    Liver disease Neg Hx    Cancer Neg Hx        Colorectal cancer   Anesthesia problems Neg Hx    Hypotension Neg Hx    Malignant  hyperthermia Neg Hx    Pseudochol deficiency Neg Hx    Past Surgical History:  Procedure Laterality Date   ABDOMINAL HYSTERECTOMY  08/15/2011   Procedure: HYSTERECTOMY ABDOMINAL;  Surgeon: Wendelyn Halter, MD;  Location: [facility];  Service: Gynecology;  Laterality: N/A;   BIOPSY  07/26/2011   NWG:NFAOZH esophageal erosions consistent with erosive reflux esophagitis.  Patulous EG junction, status post passage of a Maloney dilator (empirically), status post esophageal biopsy  followed dilation/ A 3-4 cm hiatal hernia, otherwise normal stomach, D1, D2.   CESAREAN SECTION  2007 and 2011   CHOLECYSTECTOMY  2007   ESOPHAGOGASTRODUODENOSCOPY  2008   incomplete due to inadequate conscious sedation, circumferential distal esophageal erosions, hiatal hernia   MALONEY DILATION  07/26/2011   Procedure: MALONEY DILATION;  Surgeon: Suzette Espy, MD;  Location: [facility];  Service: Endoscopy;  Laterality: N/A;  34 French Dilator   SALPINGOOPHORECTOMY  08/15/2011   Procedure: SALPINGO OOPHERECTOMY;  Surgeon: Wendelyn Halter, MD;  Location: [facility];  Service: Gynecology;  Laterality: Bilateral;   TUBAL LIGATION  2011   Social History   Social History Narrative   Pt is doing online classes.   Immunization History  Administered Date(s) Administered   Influenza-Unspecified 05/05/2014   Tdap 02/09/2015     Objective: Vital Signs: LMP 05/28/2009    Physical Exam   Musculoskeletal Exam: ***  CDAI Exam: CDAI Score: -- Patient Global: --; Provider Global: -- Swollen: --; Tender: -- Joint Exam 09/09/2023  No joint exam has been documented for this visit   There is currently no information documented on the homunculus. Go to the Rheumatology activity and complete the homunculus joint exam.  Investigation: No additional findings.  Imaging: No results found.  Recent Labs: Lab Results  Component Value Date   WBC 14.1 (H) 06/24/2021   HGB 11.5 (L) 06/24/2021   PLT 338 06/24/2021   NA 140 06/24/2021    K 4.9 06/24/2021   CL 104 06/24/2021   CO2 29 06/24/2021   GLUCOSE 215 (H) 06/24/2021   BUN 13 04/16/2023   CREATININE 0.7 04/16/2023   BILITOT 0.2 (L) 06/19/2021   ALKPHOS 79 06/19/2021   AST 9 (L) 06/19/2021   ALT 13 06/19/2021   PROT 6.3 (L) 06/19/2021   ALBUMIN 2.7 (L) 06/19/2021   CALCIUM 9.0 06/24/2021   GFRAA >60 09/27/2019    Speciality Comments: No specialty comments available.  Procedures:  No procedures performed Allergies: Betadine [povidone iodine], Ceclor [cefaclor], Hydrocodone, Morphine and codeine, Adhesive [tape], and Latex   Assessment / Plan:     Visit Diagnoses: No diagnosis found.  Orders: No orders of the defined types were placed in this encounter.  No orders of the defined types were placed in this encounter.   Face-to-face time spent with patient was *** minutes. Greater than 50% of time was spent in counseling and coordination of care.  Follow-Up Instructions: No follow-ups on file.   Matt Song, MD  Note - This record has been created using [employer].  Chart creation errors have been sought, but may not always  have been located. Such creation errors do not reflect on  the standard of medical care.

## 2023-11-15 ENCOUNTER — Telehealth: Payer: Self-pay

## 2023-11-18 ENCOUNTER — Ambulatory Visit: Admitting: Primary Care

## 2023-11-18 ENCOUNTER — Encounter: Payer: Self-pay | Admitting: Primary Care

## 2023-11-18 ENCOUNTER — Ambulatory Visit (INDEPENDENT_AMBULATORY_CARE_PROVIDER_SITE_OTHER): Admitting: Primary Care

## 2023-11-18 ENCOUNTER — Telehealth: Payer: Self-pay | Admitting: Primary Care

## 2023-11-18 VITALS — BP 162/110 | HR 85 | Ht 63.0 in | Wt 249.8 lb

## 2023-11-18 DIAGNOSIS — Z87891 Personal history of nicotine dependence: Secondary | ICD-10-CM | POA: Diagnosis not present

## 2023-11-18 DIAGNOSIS — J189 Pneumonia, unspecified organism: Secondary | ICD-10-CM

## 2023-11-18 DIAGNOSIS — G4733 Obstructive sleep apnea (adult) (pediatric): Secondary | ICD-10-CM

## 2023-11-18 NOTE — Telephone Encounter (Signed)
Closing enct-

## 2023-11-18 NOTE — Progress Notes (Signed)
 @Patient  ID: Teresa [name], female    DOB: March 12, 1986, 38 y.o.   MRN: [id number]  No chief complaint on file.   Referring provider: Michaels, Chase A, PA-C  HPI: 38 year old female, former smoker. PMH significant for OSA, asthma, lobar pneumonia, esophageal dysphagia, GERD, obesity. Former patient of Dr. Shellia.   HST 10/16/22 >> AHI 37.4, SpO2 low 56%.  Spent 443.8 min of test time with an SpO2 < 89%.  Previous [facility] pulmonary encounter 09/25/22- Dr. Shellia  She is here with her mother.  She was in hospital last year for pneumonia and sent home with supplemental oxygen.  She was seen by Dr. Jude for assessment of sleep apnea and in-lab sleep study ordered.  She was not able to arrange for childcare so she could do in-lab study.  She is not longer using oxygen at night.  She snores and wakes up feeling like her breathing stopped.  She feels panicked when this happens.  She feels tired during the day and falls asleep when sitting quiet.  She frequently gets leg cramps.  She goes to sleep between 9 and 1130 pm.  She falls asleep after 15 to 30 minutes.  She wakes up some times to use the bathroom.  She gets out of bed at 830 am.  She feels tired in the morning.  She denies morning headache.  She does not use anything to help her stay awake.  She uses xanax  to help sleep, and has used this for several years.  She denies sleep walking, sleep talking, bruxism, or nightmares.  There is no history of restless legs.  She denies sleep hallucinations, sleep paralysis, or cataplexy.  The Epworth score is 20 out of 24.    11/18/2023- Interim hx  Discussed the use of AI scribe software for clinical note transcription with the patient, who gave verbal consent to proceed.  History of Present Illness   Teresa [name] is a 38 year old female with severe obstructive sleep apnea who presents for follow-up regarding CPAP therapy.  She was initially seen in April of last year for a sleep consult due to  snoring and daytime fatigue. A home sleep study conducted in May 2024 revealed severe obstructive sleep apnea. Although a CPAP titration study was recommended, it was never scheduled, and the approval for this study is set to expire next month in July.  She was diagnosed with suspected sleep apnea while in the ICU for pneumonia in January 2024. Following this, she was discharged with home oxygen, which she uses occasionally when feeling extremely fatigued. She notes significant improvement in her condition when using oxygen at night, describing it as making her feel 'like a brand new person' the next day. Her oxygen concentrator is set at three, but she rarely uses it unless she feels 'horrible'.  During the sleep study, her oxygen levels dropped to as low as 56% with an average of 82%.  She has a history of pneumonia, diagnosed in January 2024, with right-sided involvement. Currently, no symptoms of pneumonia such as cough or shortness of breath are present. A CXR in June 2024 showed persistent slightly decreased heterogenous opacity along right heart border. Recommended follow-up imaging.   Her blood pressure is elevated today in office. She monitors her blood pressure at home, which typically runs around 126/80 mmHg. She was previously on blood pressure medication but discontinued it after losing 100 pounds. No headaches or chest pain.      Allergies  Allergen Reactions   Betadine [Povidone Iodine] Hives   Ceclor [Cefaclor] Hives   Hydrocodone  Itching   Morphine  And Codeine     PVCs and it stops my heart per patient.   Adhesive [Tape] Rash   Latex Rash    Immunization History  Administered Date(s) Administered   Influenza-Unspecified 05/05/2014   Tdap 02/09/2015    Past Medical History:  Diagnosis Date   Asthma    GERD (gastroesophageal reflux disease)    Hypertension    Hypothyroidism    PVC (premature ventricular contraction)    Tobacco abuse     Tobacco History: Social  History   Tobacco Use  Smoking Status Former   Current packs/day: 0.50   Average packs/day: 0.5 packs/day for 18.4 years (9.2 ttl pk-yrs)   Types: Cigarettes   Start date: 07/06/2005  Smokeless Tobacco Current   Ready to quit: Not Answered Counseling given: Not Answered   Outpatient Medications Prior to Visit  Medication Sig Dispense Refill   albuterol  (PROVENTIL ) (2.5 MG/3ML) 0.083% nebulizer solution Take 3 mLs (2.5 mg total) by nebulization every 4 (four) hours as needed for wheezing or shortness of breath. 75 mL 2   ALPRAZolam  (XANAX ) 1 MG tablet Take 1 mg by mouth daily as needed for anxiety.     clonazePAM (KLONOPIN) 0.5 MG tablet Take 0.5 mg by mouth daily.     Continuous Glucose Sensor (FREESTYLE LIBRE 3 SENSOR) MISC every 14 (fourteen) days.     doxycycline  (VIBRAMYCIN ) 100 MG capsule Take 1 capsule (100 mg total) by mouth 2 (two) times daily. 14 capsule 0   furosemide  (LASIX ) 20 MG tablet Take 20 mg by mouth daily.     HYDROcodone -acetaminophen  (NORCO) 10-325 MG tablet Take 1 tablet by mouth every 8 (eight) hours as needed.     ibuprofen  (ADVIL ) 200 MG tablet Take 800 mg by mouth every 6 (six) hours as needed.     levothyroxine  (SYNTHROID ) 175 MCG tablet Take 175 mcg by mouth daily.     montelukast  (SINGULAIR ) 10 MG tablet Take 10 mg by mouth daily.     omeprazole  (PRILOSEC) 40 MG capsule TAKE 1 CAPSULE(40 MG) BY MOUTH DAILY (Patient taking differently: Take 40 mg by mouth daily.) 90 capsule 2   tiZANidine  (ZANAFLEX ) 4 MG tablet Take 1 tablet (4 mg total) by mouth every 8 (eight) hours as needed for muscle spasms. 30 tablet 0   Vitamin D, Ergocalciferol, (DRISDOL) 1.25 MG (50000 UNIT) CAPS capsule Take 50,000 Units by mouth once a week.     No facility-administered medications prior to visit.    Review of Systems  Review of Systems  Constitutional:  Positive for fatigue.  HENT: Negative.    Respiratory: Negative.  Negative for cough, shortness of breath and wheezing.    Psychiatric/Behavioral:  Positive for sleep disturbance.    Physical Exam  LMP 05/28/2009  Physical Exam Constitutional:      General: She is not in acute distress.    Appearance: Normal appearance. She is obese. She is not ill-appearing.  HENT:     Head: Normocephalic and atraumatic.     Mouth/Throat:     Mouth: Mucous membranes are moist.     Pharynx: Oropharynx is clear.   Cardiovascular:     Rate and Rhythm: Normal rate and regular rhythm.     Pulses: Normal pulses.     Heart sounds: Normal heart sounds.   Musculoskeletal:        General: Normal range of motion.  Skin:    General: Skin is warm and dry.   Neurological:     General: No focal deficit present.     Mental Status: She is alert and oriented to person, place, and time. Mental status is at baseline.   Psychiatric:        Mood and Affect: Mood normal.        Behavior: Behavior normal.        Thought Content: Thought content normal.        Judgment: Judgment normal.     Lab Results:  CBC    Component Value Date/Time   WBC 14.1 (H) 06/24/2021 0515   RBC 4.17 06/24/2021 0515   HGB 11.5 (L) 06/24/2021 0515   HCT 38.0 06/24/2021 0515   PLT 338 06/24/2021 0515   MCV 91.1 06/24/2021 0515   MCH 27.6 06/24/2021 0515   MCHC 30.3 06/24/2021 0515   RDW 13.2 06/24/2021 0515   LYMPHSABS 2.6 06/22/2021 1539   MONOABS 0.7 06/22/2021 1539   EOSABS 0.1 06/22/2021 1539   BASOSABS 0.1 06/22/2021 1539    BMET    Component Value Date/Time   NA 140 06/24/2021 0515   K 4.9 06/24/2021 0515   CL 104 06/24/2021 0515   CO2 29 06/24/2021 0515   GLUCOSE 215 (H) 06/24/2021 0515   BUN 13 04/16/2023 0000   CREATININE 0.7 04/16/2023 0000   CREATININE 0.62 06/24/2021 0515   CALCIUM 9.0 06/24/2021 0515   GFRNONAA >60 06/24/2021 0515   GFRAA >60 09/27/2019 1700    BNP    Component Value Date/Time   BNP 91.0 06/23/2021 0613    ProBNP No results found for: PROBNP  Imaging: No results found.   Assessment  & Plan:   1. OSA (obstructive sleep apnea) (Primary)  Assessment and Plan    Severe obstructive sleep apnea Severe obstructive sleep apnea diagnosed via home sleep study in May 2024. CPAP titration study was recommended but not scheduled. Authorization for the study expires in July 2025. Discussed starting auto CPAP without a titration study. Titration study benefits include assessing the need for supplemental oxygen, which cannot be easily determined without it. - Recommend scheduling in-lab CPAP titration study at [facility] in [place] before authorization expires. - If titration study cannot be scheduled before authorization expires, consider starting auto CPAP and check oxygen levels at home. - Schedule follow-up 31 to 90 days after starting CPAP for compliance check. - Schedule follow-up in 6 months and then annually for CPAP supply renewal and check-in.  Pneumonia Pneumonia in January 2024, right-sided. Previous chest x-ray in June 2024 showed residual area, but no follow-up was conducted. No current symptoms such as cough or shortness of breath. - Order follow-up chest x-ray to ensure resolution.  Elevated blood pressure Blood pressure was elevated during the visit, attributed to nervousness. Previously on antihypertensive medication, discontinued after losing 100 pounds. Home blood pressure readings are typically around 126/80 mmHg. - Recheck blood pressure at home. - Follow-up with primary care provider for blood pressure management.   Almarie LELON Ferrari, NP 11/18/2023

## 2023-11-18 NOTE — Patient Instructions (Signed)
   YOUR PLAN: -SEVERE OBSTRUCTIVE SLEEP APNEA: Severe obstructive sleep apnea is a condition where your airway becomes blocked during sleep, causing breathing pauses. We recommend scheduling an in-lab CPAP titration study at [facility] in [place] before your authorization expires in July. If the study cannot be scheduled in time, we may start you on auto CPAP and monitor your oxygen levels at home. Follow-up will be scheduled 31 to 90 days after starting CPAP to check compliance, and then every 6 months for CPAP supply renewal and check-in.  -PNEUMONIA: Pneumonia is an infection that inflames the air sacs in one or both lungs. You had right-sided pneumonia in January 2024. Although you have no current symptoms, we will order a follow-up chest x-ray to ensure the infection has fully resolved.  -ELEVATED BLOOD PRESSURE: Your blood pressure was elevated during today's visit, likely due to nervousness. Your home readings are typically around 126/80 mmHg. Please continue to monitor your blood pressure at home and follow up with your primary care provider for ongoing management.  INSTRUCTIONS: Please schedule an in-lab CPAP titration study at [facility] in [place] before your authorization expires in July. If you cannot get the study scheduled in time, we will consider starting you on auto CPAP and will need to check your oxygen levels at home. Also, schedule a follow-up appointment 31 to 90 days after starting CPAP to check compliance, and then every 6 months for CPAP supply renewal and check-in. Additionally, please get a follow-up chest x-ray to ensure your pneumonia has fully resolved. Continue to monitor your blood pressure at home and follow up with your primary care provider for blood pressure management.  Orders CPAP titration study- someone will contact you to schedule CXR today follow-up on pneumonia    Follow-up 3 months with Landry NP

## 2023-11-18 NOTE — Telephone Encounter (Signed)
 CPAP titration study was ordered by Dr. Shellia for Teresa [name] in May 2024 and she was never contacted to schedule. She tells me that the authorization for CPAP titration study expires in 1 month. Please call patient to schedule if able or let me know if I need to re-order

## 2023-11-20 NOTE — Telephone Encounter (Signed)
 Please place a new order for sleep study, this order expires 11/26/23 and cannot be scheduled by the sleep lab til new order is in

## 2023-11-21 NOTE — Telephone Encounter (Signed)
 New order has been placed.

## 2023-11-26 ENCOUNTER — Ambulatory Visit (HOSPITAL_BASED_OUTPATIENT_CLINIC_OR_DEPARTMENT_OTHER): Attending: Primary Care | Admitting: Internal Medicine

## 2023-12-04 ENCOUNTER — Telehealth: Payer: Self-pay

## 2023-12-04 DIAGNOSIS — G4733 Obstructive sleep apnea (adult) (pediatric): Secondary | ICD-10-CM

## 2023-12-04 NOTE — Telephone Encounter (Signed)
 Please place an order for auto CPAP 5-20cm h20 re: severe OSA and fu in 31-90 days for compliance check. She will need Overnight oximetry test ordered at her follow-up to assess oxygen level on PAP therapy

## 2023-12-04 NOTE — Telephone Encounter (Signed)
 Was CPAP titration study denied? I was told it was

## 2023-12-04 NOTE — Telephone Encounter (Signed)
 Copied from CRM [id number]. Topic: Clinical - Request for Lab/Test Order >> Dec 03, 2023  4:30 PM Teresa [name] wrote: Reason for CRM: Pt stated her order for CPAP titration study was denied by her insurance. Pt would like to discuss options at phone number [phone] ok to leave a vm. Pt stated she was told by NP Almarie Ferrari that she would help with ordering CPAP equipment and adjust the settings herself.  Please Advise

## 2023-12-04 NOTE — Telephone Encounter (Signed)
 Pt no showed her sleep study twice   Does pt want to be rescheduled?

## 2023-12-04 NOTE — Telephone Encounter (Signed)
 Spoke with pt and told her I placed the order that Beth told me place, she'll get a call from my front office about a compliance appt. And a call from the DME in a few days regarding her CPAP. NFN

## 2023-12-10 ENCOUNTER — Ambulatory Visit: Attending: Physician Assistant | Admitting: Audiology

## 2023-12-10 DIAGNOSIS — H9193 Unspecified hearing loss, bilateral: Secondary | ICD-10-CM | POA: Diagnosis present

## 2023-12-10 DIAGNOSIS — H9313 Tinnitus, bilateral: Secondary | ICD-10-CM | POA: Insufficient documentation

## 2023-12-10 NOTE — Procedures (Unsigned)
 Outpatient Audiology and [facility] [address] [place], [place]  [address] [phone]  AUDIOLOGICAL  EVALUATION  NAME: Teresa [name]     DOB:   08-17-1985      MRN: [id number]                                                                                     DATE: 12/10/2023     REFERENT: Nena Cyndee LABOR, PA-C STATUS: Outpatient DIAGNOSIS: decreased hearing    History: Teresa [name] was seen for an audiological evaluation due to concerns regarding her hearing sensitivity.  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  Teresa [name] does not know what caused her hearing loss in childhood.  Teresa [name] has never utilized hearing aids.  Teresa [name] reports her hearing has decreased over the past year and she is struggling greatly to hear and communicate.  Teresa [name] reports she is often asking for repetition.  She reports bilateral aural fullness and tinnitus.  She reports intermittent episodes of dizziness and vertigo.  She denies otalgia.  Teresa [name] reports she has not been seen by an ear nose and throat physician since childhood.  Evaluation:  Otoscopy showed a clear view of the tympanic membranes, bilaterally Tympanometry results were consistent with normal middle ear pressure and hyper mobile tympanic membrane mobility (Ad), bilaterally Audiometric testing was completed using Conventional Audiometry techniques with insert earphones and TDH headphones. 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 Speech Recognition Thresholds were obtained at 35 dB HL in the right ear and at 35 dB HL in the left ear. Word Recognition Testing was completed at 70 dB HL and Buelah scored 100%, bilaterally.  Word recognition testing was completed again at 50 dB HL and Jerae scored 88%, bilaterally.  It is noted that test reliability is considered fair.  The SRT and  PTA were not in agreement.  [name] was reinstructed multiple times during testing and multiple different transducers were used for pure-tone testing.  Results:  The test results were reviewed with Teresa [name] and her husband.  Today's test results from tympanometry show hypermobile tympanic membrane mobility in both ears.  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  Test reliability is considered fair as the SRT and PTA were not in agreement and [name]'s word recognition scores at 50 dB HL were 88% in the right ear and 88% in the left ear.  Teresa [name] may have hearing and communication difficulty in adverse listening environments.  She will benefit from the use of good communication strategies.  A referral to an ear nose and throat physician was reviewed due to Teresa [name]'s history of ear surgery as a child and mixed hearing loss at today's evaluation.  Repeat audiological testing is recommended at time of the ENT evaluation to confirm hearing sensitivity.  Recommendations: 1.   Referral to an Ear, Nose, and Throat, Physician at [facility] for evaluation of mixed  hearing loss and history of ear surgery as a child. 2.   Repeat audiological evaluation 3.   Use of amplification if motivated   45 minutes spent testing and counseling on results. The audiogram is under the media file.   If you have any questions please feel free to contact me at [phone].  Darryle Posey Audiologist, Au.D., CCC-A 12/10/2023  3:12 PM  Cc: Nena Cyndee LABOR, PA-C

## 2024-01-18 ENCOUNTER — Other Ambulatory Visit: Payer: Self-pay | Admitting: Medical Genetics

## 2024-01-20 ENCOUNTER — Other Ambulatory Visit (HOSPITAL_COMMUNITY)
Admission: RE | Admit: 2024-01-20 | Discharge: 2024-01-20 | Disposition: A | Discharge status: Home / Self Care | Payer: Self-pay | Source: Ambulatory Visit | Attending: Medical Genetics | Admitting: Medical Genetics

## 2024-01-28 LAB — GENECONNECT MOLECULAR SCREEN: Genetic Analysis Overall Interpretation: NEGATIVE

## 2024-03-02 ENCOUNTER — Other Ambulatory Visit (HOSPITAL_COMMUNITY): Payer: Self-pay | Admitting: Physician Assistant

## 2024-03-02 ENCOUNTER — Ambulatory Visit (HOSPITAL_COMMUNITY)
Admission: RE | Admit: 2024-03-02 | Discharge: 2024-03-02 | Disposition: A | Discharge status: Home / Self Care | Source: Ambulatory Visit | Attending: Physician Assistant | Admitting: Physician Assistant

## 2024-03-02 DIAGNOSIS — R051 Acute cough: Secondary | ICD-10-CM | POA: Insufficient documentation

## 2024-03-02 DIAGNOSIS — M25532 Pain in left wrist: Secondary | ICD-10-CM | POA: Insufficient documentation

## 2024-03-10 ENCOUNTER — Other Ambulatory Visit (HOSPITAL_COMMUNITY): Payer: Self-pay | Admitting: Physician Assistant

## 2024-03-10 DIAGNOSIS — F17200 Nicotine dependence, unspecified, uncomplicated: Secondary | ICD-10-CM

## 2024-03-10 DIAGNOSIS — R051 Acute cough: Secondary | ICD-10-CM
